# Patient Record
Sex: Female | Born: 1940 | Race: White | Hispanic: No | Smoking: Never smoker
Health system: Southern US, Community
[De-identification: ages and names within clinical notes are randomized; demographics above are authoritative.]

## PROBLEM LIST (undated history)

## (undated) DIAGNOSIS — K5080 Crohn's disease of both small and large intestine without complications: Secondary | ICD-10-CM

## (undated) DIAGNOSIS — I10 Essential (primary) hypertension: Secondary | ICD-10-CM

## (undated) DIAGNOSIS — F329 Major depressive disorder, single episode, unspecified: Secondary | ICD-10-CM

## (undated) DIAGNOSIS — K219 Gastro-esophageal reflux disease without esophagitis: Secondary | ICD-10-CM

## (undated) DIAGNOSIS — F419 Anxiety disorder, unspecified: Secondary | ICD-10-CM

## (undated) DIAGNOSIS — F32A Depression, unspecified: Secondary | ICD-10-CM

## (undated) DIAGNOSIS — K8050 Calculus of bile duct without cholangitis or cholecystitis without obstruction: Secondary | ICD-10-CM

## (undated) DIAGNOSIS — K5010 Crohn's disease of large intestine without complications: Secondary | ICD-10-CM

## (undated) HISTORY — DX: Essential (primary) hypertension: I10

## (undated) HISTORY — DX: Gastro-esophageal reflux disease without esophagitis: K21.9

## (undated) HISTORY — PX: OTHER SURGICAL HISTORY: SHX169

## (undated) HISTORY — PX: BLADDER SUSPENSION: SHX72

## (undated) HISTORY — PX: CHOLECYSTECTOMY: SHX55

## (undated) HISTORY — PX: APPENDECTOMY: SHX54

## (undated) HISTORY — PX: COLONOSCOPY: SHX174

## (undated) HISTORY — DX: Crohn's disease of large intestine without complications: K50.10

## (undated) HISTORY — PX: TOTAL ABDOMINAL HYSTERECTOMY: SHX209

---

## 2013-01-02 ENCOUNTER — Encounter (INDEPENDENT_AMBULATORY_CARE_PROVIDER_SITE_OTHER): Payer: Self-pay | Admitting: *Deleted

## 2013-01-08 ENCOUNTER — Encounter (INDEPENDENT_AMBULATORY_CARE_PROVIDER_SITE_OTHER): Payer: Self-pay | Admitting: *Deleted

## 2013-02-05 ENCOUNTER — Telehealth (INDEPENDENT_AMBULATORY_CARE_PROVIDER_SITE_OTHER): Payer: Self-pay | Admitting: *Deleted

## 2013-02-05 ENCOUNTER — Ambulatory Visit (INDEPENDENT_AMBULATORY_CARE_PROVIDER_SITE_OTHER): Payer: Medicare Other | Admitting: Internal Medicine

## 2013-02-05 ENCOUNTER — Encounter (INDEPENDENT_AMBULATORY_CARE_PROVIDER_SITE_OTHER): Payer: Self-pay | Admitting: Internal Medicine

## 2013-02-05 ENCOUNTER — Other Ambulatory Visit (INDEPENDENT_AMBULATORY_CARE_PROVIDER_SITE_OTHER): Payer: Self-pay | Admitting: *Deleted

## 2013-02-05 VITALS — BP 128/68 | HR 60 | Temp 98.6°F | Ht 62.0 in | Wt 143.8 lb

## 2013-02-05 DIAGNOSIS — Z1211 Encounter for screening for malignant neoplasm of colon: Secondary | ICD-10-CM

## 2013-02-05 DIAGNOSIS — K5010 Crohn's disease of large intestine without complications: Secondary | ICD-10-CM

## 2013-02-05 DIAGNOSIS — K5090 Crohn's disease, unspecified, without complications: Secondary | ICD-10-CM

## 2013-02-05 LAB — CBC WITH DIFFERENTIAL/PLATELET
Basophils Absolute: 0 10*3/uL (ref 0.0–0.1)
Eosinophils Absolute: 0.1 10*3/uL (ref 0.0–0.7)
Eosinophils Relative: 2 % (ref 0–5)
MCH: 29.5 pg (ref 26.0–34.0)
MCV: 84.1 fL (ref 78.0–100.0)
Platelets: 191 10*3/uL (ref 150–400)
RDW: 14 % (ref 11.5–15.5)

## 2013-02-05 MED ORDER — PEG-KCL-NACL-NASULF-NA ASC-C 100 G PO SOLR: 1.0000 | Freq: Once | ORAL | Status: DC

## 2013-02-05 NOTE — Progress Notes (Signed)
Subjective:     Patient ID: Annette Briggs, female   DOB: [birth year], 72 y.o.   MRN: [id number] Patient is allergic to the pneumonia shot.    HPI Referred to our office by Dr. Dwana Melena for Crohn's. Diagnosed with Crohn's disease 8 yrs in [place] by Dr. Royetta Crochet in [place] on Colonoscopy. She tells me she is okay. She tells me sometimes she will have diffuse abdominal pain. She has diarrhea frequently. There is not bright red rectal bleeding. No mucous. She usually has 1-3 stools a day. She usually has diarrhea once a week.  Presently not taking any medication for her Crohn's. She was on Entocort once a day for years she tell me  And then was told by Dr. Royetta Crochet to take the Entocort just when she had a flare. Appetite is good. No unintentional weight loss.  One aunt with hx colon cancer at age 74 or 37.   04/29/2007 Colonoscopy Karolee Stamps MD.([place]) Multiple diverticula were seen in the sigmoid colon and dscending colon. Diverticulosis appeared to be of moderate severity. Grade 1 external hemorrhoids. Ulceration in the terminal ileum. (biopsy). Otherwise normal colonoscopy to cecum and terminal ileum. Biopsy: Active chronic ileitis. Negative for granuloma.  These finding may be compatible with IBD in the appropriate clinical setting.    She is widowed. Her husband has been deceased for 3 yrs from Leukemia.  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  She has 3 children in good health.   Review of Systems Past Medical History  Diagnosis Date  . Crohn's colitis   . GERD (gastroesophageal reflux disease)    No current outpatient prescriptions on file prior to visit.   No current facility-administered medications on file prior to visit.   Past Surgical History  Procedure Laterality Date  . Total abdominal hysterectomy    . Appendectomy    . Cholecystectomy    . Bladder tac      10 yrs ago  . Colonoscopy      2008   Allergies  Allergen Reactions  . Ciprofloxacin   . Wellbutrin  (Bupropion)     Anxiety attack       Objective:   Physical Exam  Filed Vitals:   02/05/13 1013  BP: 128/68  Pulse: 60  Temp: 98.6 F (37 C)  Height: 5\' 2"  (1.575 m)  Weight: 143 lb 12.8 oz (65.227 kg)  Alert and oriented. Skin warm and dry. Oral mucosa is moist.   . Sclera anicteric, conjunctivae is pink. Thyroid not enlarged. No cervical lymphadenopathy. Lungs clear. Heart regular rate and rhythm.  Abdomen is soft. Bowel sounds are positive. No hepatomegaly. No abdominal masses felt. No tenderness.  No edema to lower extremities.        Assessment:   Crohn's disease which seems to be remission. Presently not taking any medications for Crohn's except on a prn basis.    Plan:     CBC and sedrate. Surveillance colonoscopy for Crohn's

## 2013-02-05 NOTE — Patient Instructions (Addendum)
CBC, Sed rate, Surveillance colonoscopy

## 2013-02-05 NOTE — Telephone Encounter (Signed)
Patient needs movi prep 

## 2013-02-07 ENCOUNTER — Encounter (HOSPITAL_COMMUNITY): Payer: Self-pay | Admitting: Pharmacy Technician

## 2013-02-13 ENCOUNTER — Encounter (HOSPITAL_COMMUNITY): Payer: Self-pay | Admitting: *Deleted

## 2013-02-13 ENCOUNTER — Ambulatory Visit (HOSPITAL_COMMUNITY)
Admission: RE | Admit: 2013-02-13 | Discharge: 2013-02-13 | Disposition: A | Discharge status: Home / Self Care | Payer: Medicare Other | Source: Ambulatory Visit | Attending: Internal Medicine | Admitting: Internal Medicine

## 2013-02-13 ENCOUNTER — Encounter (HOSPITAL_COMMUNITY)
Admission: RE | Disposition: A | Discharge status: Home / Self Care | Payer: Self-pay | Source: Ambulatory Visit | Attending: Internal Medicine

## 2013-02-13 DIAGNOSIS — K5289 Other specified noninfective gastroenteritis and colitis: Secondary | ICD-10-CM

## 2013-02-13 DIAGNOSIS — Z800 Family history of malignant neoplasm of digestive organs: Secondary | ICD-10-CM

## 2013-02-13 DIAGNOSIS — K5090 Crohn's disease, unspecified, without complications: Secondary | ICD-10-CM

## 2013-02-13 DIAGNOSIS — K5730 Diverticulosis of large intestine without perforation or abscess without bleeding: Secondary | ICD-10-CM

## 2013-02-13 DIAGNOSIS — Z09 Encounter for follow-up examination after completed treatment for conditions other than malignant neoplasm: Secondary | ICD-10-CM | POA: Insufficient documentation

## 2013-02-13 DIAGNOSIS — I10 Essential (primary) hypertension: Secondary | ICD-10-CM | POA: Insufficient documentation

## 2013-02-13 DIAGNOSIS — D126 Benign neoplasm of colon, unspecified: Secondary | ICD-10-CM | POA: Insufficient documentation

## 2013-02-13 DIAGNOSIS — K633 Ulcer of intestine: Secondary | ICD-10-CM

## 2013-02-13 HISTORY — DX: Depression, unspecified: F32.A

## 2013-02-13 HISTORY — DX: Essential (primary) hypertension: I10

## 2013-02-13 HISTORY — PX: COLONOSCOPY: SHX5424

## 2013-02-13 HISTORY — DX: Major depressive disorder, single episode, unspecified: F32.9

## 2013-02-13 SURGERY — COLONOSCOPY
Anesthesia: Moderate Sedation

## 2013-02-13 MED ORDER — MEPERIDINE HCL 50 MG/ML IJ SOLN
INTRAMUSCULAR | Status: AC
  Filled 2013-02-13: qty 1

## 2013-02-13 MED ORDER — MIDAZOLAM HCL 5 MG/5ML IJ SOLN
INTRAMUSCULAR | Status: DC | PRN
  Administered 2013-02-13 (×3): 2 mg via INTRAVENOUS

## 2013-02-13 MED ORDER — SULFAMETHOXAZOLE-TMP DS 800-160 MG PO TABS: 1.0000 | ORAL_TABLET | Freq: Two times a day (BID) | ORAL | Status: DC

## 2013-02-13 MED ORDER — MIDAZOLAM HCL 5 MG/5ML IJ SOLN
INTRAMUSCULAR | Status: AC
  Filled 2013-02-13: qty 10

## 2013-02-13 MED ORDER — METRONIDAZOLE 500 MG PO TABS: 500.0000 mg | ORAL_TABLET | Freq: Two times a day (BID) | ORAL | Status: DC

## 2013-02-13 MED ORDER — STERILE WATER FOR IRRIGATION IR SOLN
Status: DC | PRN
  Administered 2013-02-13: 07:00:00

## 2013-02-13 MED ORDER — SODIUM CHLORIDE 0.9 % IV SOLN
INTRAVENOUS | Status: DC
  Administered 2013-02-13: 1000 mL via INTRAVENOUS

## 2013-02-13 MED ORDER — MEPERIDINE HCL 50 MG/ML IJ SOLN
INTRAMUSCULAR | Status: DC | PRN
  Administered 2013-02-13 (×2): 25 mg via INTRAVENOUS

## 2013-02-13 NOTE — H&P (Addendum)
Annette Briggs is an 72 y.o. female.   Chief Complaint: Patient is here for colonoscopy. HPI: Patient is a 72 year old Caucasian female who is surveillance colonoscopy. She history of Crohn's disease has been in remission for the past few years and not taking medications. She denies abdominal pain melena or rectal bleeding. Family history is positive for colon carcinoma in paternal was 18 at the time of diagnosis.  Past Medical History  Diagnosis Date  . Crohn's colitis   . GERD (gastroesophageal reflux disease)   . Hypertension   . Depression     Past Surgical History  Procedure Laterality Date  . Total abdominal hysterectomy    . Appendectomy    . Cholecystectomy    . Bladder tac      10 yrs ago  . Colonoscopy      2008    History reviewed. No pertinent family history. Social History:  reports that she has never smoked. She does not have any smokeless tobacco history on file. She reports that she does not drink alcohol or use illicit drugs.  Allergies:  Allergies  Allergen Reactions  . Ciprofloxacin Other (See Comments)    Patient can't remember reaction.  . Pneumovax [Pneumococcal Polysaccharides] Swelling  . Wellbutrin [Bupropion]     Anxiety attack    Medications Prior to Admission  Medication Sig Dispense Refill  . budesonide (ENTOCORT EC) 3 MG 24 hr capsule Take 3 mg by mouth as needed (body aches).       . calcium carbonate (TUMS - DOSED IN MG ELEMENTAL CALCIUM) 500 MG chewable tablet Chew 1 tablet by mouth daily.      . fish oil-omega-3 fatty acids 1000 MG capsule Take 2 g by mouth daily.      Marland Kitchen glucosamine-chondroitin 500-400 MG tablet Take 1 tablet by mouth 3 (three) times daily.      Marland Kitchen loperamide (IMODIUM) 2 MG capsule Take 2 mg by mouth 4 (four) times daily as needed for diarrhea or loose stools.      Marland Kitchen LORazepam (ATIVAN) 0.5 MG tablet Take 0.5 mg by mouth as needed for anxiety.       . metoprolol tartrate (LOPRESSOR) 25 MG tablet Take 25 mg by mouth 2 (two) times  daily.      . Multiple Vitamin (MULTIVITAMIN) tablet Take 1 tablet by mouth daily.      . pantoprazole (PROTONIX) 40 MG tablet Take 40 mg by mouth daily.      . peg 3350 powder (MOVIPREP) 100 G SOLR Take 1 kit (200 g total) by mouth once.  1 kit  0  . pravastatin (PRAVACHOL) 40 MG tablet Take 40 mg by mouth daily.      Marland Kitchen venlafaxine XR (EFFEXOR-XR) 37.5 MG 24 hr capsule Take 37.5 mg by mouth daily.        No results found for this or any previous visit (from the past 48 hour(s)). No results found.  ROS  Blood pressure 126/69, temperature 97.8 F (36.6 C), temperature source Oral, resp. rate 19, height 5' 2"  (1.575 m), weight 143 lb (64.864 kg), SpO2 97.00%. Physical Exam  Constitutional: She appears well-developed and well-nourished.  HENT:  Mouth/Throat: Oropharynx is clear and moist.  Eyes: Conjunctivae are normal.  Neck: No thyromegaly present.  Cardiovascular: Normal rate, regular rhythm and normal heart sounds.   No murmur heard. Respiratory: Effort normal and breath sounds normal.  GI: Soft. She exhibits no distension and no mass. There is no tenderness.  Musculoskeletal: She exhibits no  edema.  Lymphadenopathy:    She has no cervical adenopathy.  Neurological: She is alert.  Skin: Skin is warm and dry.     Assessment/Plan Surveillance colonoscopy. History of small bowel Crohn's disease.  Zachary Lovins U 02/13/2013, 7:38 AM

## 2013-02-13 NOTE — Op Note (Signed)
COLONOSCOPY PROCEDURE REPORT  PATIENT:  Annette Briggs  MR#:  [id number] Birthdate:  05/07/1941, 72 y.o., female Endoscopist:  Dr. [name], MD Referred By:  Dr. [name], MD Procedure Date: 02/13/2013  Procedure:   Colonoscopy  Indications:  Patient is 72 year old Caucasian female with history of Crohn disease is undergoing surveillance colonoscopy. She is on no maintenance therapy and takes the desonide on as-needed basis.  Informed Consent:  The procedure and risks were reviewed with the patient and informed consent was obtained.  Medications:  Demerol 50 mg IV Versed 6 mg IV  Description of procedure:  After a digital rectal exam was performed, that colonoscope was advanced from the anus through the rectum and colon to the area of the cecum, ileocecal valve and appendiceal orifice. The cecum was deeply intubated. These structures were well-seen and photographed for the record. From the level of the cecum and ileocecal valve, the scope was slowly and cautiously withdrawn. The mucosal surfaces were carefully surveyed utilizing scope tip to flexion to facilitate fold flattening as needed. The scope was pulled down into the rectum where a thorough exam including retroflexion was performed. Terminal ileum was also examined.  Findings:  Prep satisfactory. Few scattered small ulcers noted involving terminal ileum. Biopsy taken. Multiple diverticula at sigmoid colon with few at descending colon. Two ulcerated areas the distal sigmoid colon. Biopsy taken from distal of these 2 ulcers. Normal rectal mucosa and anal rectal junction.   Therapeutic/Diagnostic Maneuvers Performed:  See above  Complications:  None  Cecal Withdrawal Time:  15 minutes  Impression:  Few small ulcers involving terminal ileum consistent with active Crohn's disease. Left-sided diverticulosis. Focal ulceration at two areas involving sigmoid colon. Biopsy taken from distal of these ulcers. These changes could  either be secondary to Crohn's colitis or diverticulitis although it would be unusual for her to have diverticulitis involving two diverticula at the same time.  Recommendations:  Standard instructions given. Bactrim DS 1 by mouth twice a day 10 days. Metronidazole 500 mg by mouth twice a day for 10 days. I will contact patient with biopsy results and further recommendations.  Kynadie Yaun U  02/13/2013 8:33 AM  CC: Dr. Delphina Cahill, MD & Dr. Rayne Du ref. provider found

## 2013-02-17 ENCOUNTER — Other Ambulatory Visit (INDEPENDENT_AMBULATORY_CARE_PROVIDER_SITE_OTHER): Payer: Self-pay | Admitting: Internal Medicine

## 2013-02-17 ENCOUNTER — Encounter (HOSPITAL_COMMUNITY): Payer: Self-pay | Admitting: Internal Medicine

## 2013-02-17 MED ORDER — MESALAMINE ER 500 MG PO CPCR: 1000.0000 mg | ORAL_CAPSULE | Freq: Three times a day (TID) | ORAL | Status: DC

## 2013-02-18 ENCOUNTER — Encounter (INDEPENDENT_AMBULATORY_CARE_PROVIDER_SITE_OTHER): Payer: Self-pay | Admitting: *Deleted

## 2013-02-18 NOTE — Progress Notes (Signed)
Apt has been scheduled for a 8 week f/u with Dr. Karilyn Cota on 04/15/13.

## 2013-02-20 ENCOUNTER — Encounter (INDEPENDENT_AMBULATORY_CARE_PROVIDER_SITE_OTHER): Payer: Self-pay

## 2013-04-11 ENCOUNTER — Ambulatory Visit (INDEPENDENT_AMBULATORY_CARE_PROVIDER_SITE_OTHER): Payer: Medicare Other | Admitting: Internal Medicine

## 2013-04-15 ENCOUNTER — Ambulatory Visit (INDEPENDENT_AMBULATORY_CARE_PROVIDER_SITE_OTHER): Payer: Medicare Other | Admitting: Internal Medicine

## 2013-04-21 ENCOUNTER — Ambulatory Visit (INDEPENDENT_AMBULATORY_CARE_PROVIDER_SITE_OTHER): Payer: Medicare Other | Admitting: Internal Medicine

## 2013-04-21 ENCOUNTER — Encounter (INDEPENDENT_AMBULATORY_CARE_PROVIDER_SITE_OTHER): Payer: Self-pay | Admitting: Internal Medicine

## 2013-04-21 VITALS — BP 142/60 | HR 64 | Temp 99.2°F | Ht 62.0 in | Wt 144.8 lb

## 2013-04-21 DIAGNOSIS — F32A Depression, unspecified: Secondary | ICD-10-CM | POA: Insufficient documentation

## 2013-04-21 DIAGNOSIS — I10 Essential (primary) hypertension: Secondary | ICD-10-CM | POA: Insufficient documentation

## 2013-04-21 DIAGNOSIS — F329 Major depressive disorder, single episode, unspecified: Secondary | ICD-10-CM

## 2013-04-21 NOTE — Patient Instructions (Addendum)
OV in 3 months. Continue the Pentasa. 1gm TID.

## 2013-04-21 NOTE — Progress Notes (Signed)
Subjective:     Patient ID: Annette Briggs, female   DOB: Nov 21, 1940, 72 y.o.   MRN: [id number]  HPI Here today for f/u after recent colonoscopy for surveillance for Crohn's. She was diagnosed with Crohn's 8 yrs ago in [place] by Dr. Royetta Crochet on colonoscopy.  She has not been maintained on medications for her Crohn's HPI Referred to our office by Dr. Dwana Melena for Crohn's. Diagnosed with Crohn's disease 8 yrs in [place] by Dr. Royetta Crochet in [place] on Colonoscopy.  On Colonoscopy found to have focal ulceration at two areas involving the sigmoid colon.  Biopsy of ileum showed changes consistent with Crohn's disease. Started on Entocort 9mg , 6mg  3mg  each for 2 weeks then stop. She was also started on Pentasa 1gm TID.  She tells me she is doing pretty good. Some days she is constipated. Today she has had 3 stools today. She says her stools have been much normal now. There has been no diarrhea. She does have some rectal pain but this is not every day. Appetite is good. No weight loss.  Her BMs are brown in color. No melena or bright red rectal bleeding.      02/13/2013 Colonoscopy: Surveillance for Crohn's: Impression:  Few small ulcers involving terminal ileum consistent with active Crohn's disease.  Left-sided diverticulosis.  Focal ulceration at two areas involving sigmoid colon. Biopsy taken from distal of these ulcers. These changes could either be secondary to Crohn's colitis or diverticulitis although it would be unusual for her to have diverticulitis involving two diverticula at the same time.   Biopsy:   Biopsy from ileum and sigmoid colon shows changes consistent with Crohn's disease. 2 small polyps are tubular adenomas.             02/05/2013 Sedrate 12. CBC    Component Value Date/Time   WBC 5.8 02/05/2013 1110   RBC 4.71 02/05/2013 1110   HGB 13.9 02/05/2013 1110   HCT 39.6 02/05/2013 1110   PLT 191 02/05/2013 1110   MCV 84.1 02/05/2013 1110   MCH 29.5 02/05/2013 1110   MCHC 35.1 02/05/2013  1110   RDW 14.0 02/05/2013 1110   LYMPHSABS 1.5 02/05/2013 1110   MONOABS 0.6 02/05/2013 1110   EOSABS 0.1 02/05/2013 1110   BASOSABS 0.0 02/05/2013 1110      Review of Systems     Objective:   Physical Exam  Filed Vitals:   04/21/13 1554  BP: 142/60  Pulse: 64  Temp: 99.2 F (37.3 C)  Height: 5\' 2"  (1.575 m)  Weight: 144 lb 12.8 oz (65.681 kg)   Alert and oriented. Skin warm and dry. Oral mucosa is moist.   . Sclera anicteric, conjunctivae is pink. Thyroid not enlarged. No cervical lymphadenopathy. Lungs clear. Heart regular rate and rhythm.  Abdomen is soft. Bowel sounds are positive. No hepatomegaly. No abdominal masses felt.Slight tenderness left lower quadrant.  No edema to lower extremities.       Assessment:    Crohn's. Active on recent colonoscopy for surveillance. Started on Entocort which she has finished. Pentasa 1gm TID stated. She     Plan:    ov in 3 months. Will get a CRP with visit. Continue the Pentasa

## 2013-07-22 ENCOUNTER — Ambulatory Visit (INDEPENDENT_AMBULATORY_CARE_PROVIDER_SITE_OTHER): Payer: Medicare Other | Admitting: Internal Medicine

## 2013-07-22 ENCOUNTER — Encounter (INDEPENDENT_AMBULATORY_CARE_PROVIDER_SITE_OTHER): Payer: Self-pay | Admitting: Internal Medicine

## 2013-07-22 VITALS — BP 124/76 | HR 74 | Temp 99.8°F | Resp 18 | Ht 62.0 in | Wt 143.1 lb

## 2013-07-22 DIAGNOSIS — K219 Gastro-esophageal reflux disease without esophagitis: Secondary | ICD-10-CM | POA: Insufficient documentation

## 2013-07-22 DIAGNOSIS — K5080 Crohn's disease of both small and large intestine without complications: Secondary | ICD-10-CM

## 2013-07-22 NOTE — Progress Notes (Signed)
Presenting complaint;  Followup for Crohn's disease..  Subjective:  Patient is 73 year old Caucasian female with approximately 7 year history of Crohn's disease who presents for scheduled visit. She was last seen by Ms. Setzer NP October 2014. She had colonoscopy on 02/13/2013 revealing ileal and colonic disease. Colonic disease was in the sigmoid colon and she also had sigmoid and descending colon diverticula. She is on Pentasa. She continues to feel well. She has 2-3 bowel movements per day. Stool consistency base between normal to loose with please half of the stools are loose. She has urgency but has not had any accidents. She denies melena or rectal bleeding. She does not have nocturnal diarrhea has sporadic left-sided abdominal pain relieved with ibuprofen she does not take often to  Current Medications: Current Outpatient Prescriptions  Medication Sig Dispense Refill  . atorvastatin (LIPITOR) 20 MG tablet Take 20 mg by mouth daily.       . calcium carbonate (TUMS - DOSED IN MG ELEMENTAL CALCIUM) 500 MG chewable tablet Chew 1 tablet by mouth daily.      . fish oil-omega-3 fatty acids 1000 MG capsule Take 1 g by mouth.       Marland Kitchen glucosamine-chondroitin 500-400 MG tablet Take 1 tablet by mouth daily.       Marland Kitchen loperamide (IMODIUM) 2 MG capsule Take 2 mg by mouth 4 (four) times daily as needed for diarrhea or loose stools.      Marland Kitchen LORazepam (ATIVAN) 0.5 MG tablet Take 0.5 mg by mouth as needed for anxiety.       . mesalamine (PENTASA) 500 MG CR capsule Take 2 capsules (1,000 mg total) by mouth 3 (three) times daily.  180 capsule  5  . metoprolol tartrate (LOPRESSOR) 25 MG tablet Take 25 mg by mouth 2 (two) times daily.      . Multiple Vitamin (MULTIVITAMIN) tablet Take 1 tablet by mouth daily.      . pantoprazole (PROTONIX) 40 MG tablet Take 40 mg by mouth daily.      Marland Kitchen venlafaxine XR (EFFEXOR-XR) 37.5 MG 24 hr capsule Take 37.5 mg by mouth daily.      Marland Kitchen VITAMIN D, CHOLECALCIFEROL, PO Take 500  Units by mouth daily.       No current facility-administered medications for this visit.     Objective: Blood pressure 124/76, pulse 74, temperature 99.8 F (37.7 C), temperature source Oral, resp. rate 18, height 5' 2"  (1.575 m), weight 143 lb 1.6 oz (64.91 kg). Patient is alert and in no acute distress. Conjunctiva is pink. Sclera is nonicteric Oropharyngeal mucosa is normal. No neck masses or thyromegaly noted. Cardiac exam with regular rhythm normal S1 and S2. No murmur or gallop noted. Lungs are clear to auscultation. Abdomen is symmetrical. It is soft and nontender without organomegaly or masses. No LE edema or clubbing noted.    Assessment:  #1. Ileocolonic Crohn's disease. She has intermittent diarrhea which may indicate mild symptoms but could be unrelated. .   Plan:  Continue Pentasa at 1 g by mouth 3 times a day. Keep record of times Imodium and/or Advil taken. Office visit in 6 months.

## 2013-07-22 NOTE — Patient Instructions (Addendum)
Please keep record of how often you take Imodium and ibuprofen.

## 2013-11-17 ENCOUNTER — Other Ambulatory Visit (INDEPENDENT_AMBULATORY_CARE_PROVIDER_SITE_OTHER): Payer: Self-pay | Admitting: Internal Medicine

## 2013-11-17 NOTE — Telephone Encounter (Signed)
Per Dr.Rehman may fill with 6 refills. 

## 2013-11-18 ENCOUNTER — Telehealth (INDEPENDENT_AMBULATORY_CARE_PROVIDER_SITE_OTHER): Payer: Self-pay | Admitting: *Deleted

## 2013-11-18 NOTE — Telephone Encounter (Signed)
She can draw Pentasa dose to 1 g twice daily; however if symptoms relapse she needs to go back up on the dose and let us know. Can be provided her with some samples. Tammy, please call patient

## 2013-11-18 NOTE — Telephone Encounter (Signed)
Jamielyn would like to know if it would be okay to cut back on the Pentasa to help save the meds and money. She is in a donut hole and it is going to cost her $355 monthly. Her return phone number is [phone].

## 2013-11-18 NOTE — Telephone Encounter (Signed)
Patient is currently taking 2 capsules by mouth three times a day.

## 2013-11-19 NOTE — Telephone Encounter (Signed)
Patient was called and made aware. 

## 2014-01-26 ENCOUNTER — Ambulatory Visit (INDEPENDENT_AMBULATORY_CARE_PROVIDER_SITE_OTHER): Payer: Medicare Other | Admitting: Internal Medicine

## 2014-01-26 ENCOUNTER — Encounter (INDEPENDENT_AMBULATORY_CARE_PROVIDER_SITE_OTHER): Payer: Self-pay | Admitting: Internal Medicine

## 2014-01-26 VITALS — BP 128/72 | HR 74 | Temp 98.5°F | Resp 18 | Ht 62.0 in | Wt 141.4 lb

## 2014-01-26 DIAGNOSIS — K5080 Crohn's disease of both small and large intestine without complications: Secondary | ICD-10-CM

## 2014-01-26 MED ORDER — MESALAMINE ER 500 MG PO CPCR: 1000.0000 mg | ORAL_CAPSULE | Freq: Three times a day (TID) | ORAL | Status: DC

## 2014-01-26 NOTE — Patient Instructions (Signed)
Call if diarrhea or abdominal pain occurs more frequently.

## 2014-01-26 NOTE — Progress Notes (Signed)
Presenting complaint;  Followup for  Crohn's disease.  Subjective:  Patient is a 73 year old Caucasian female with history of ileo-colonic Crohn's disease who presents for scheduled visit. She was last seen 6 months ago. Presently she is doing well. 2 months ago she was in [place] and experience abdominal pain and diarrhea it resolved after 5 days. She did not have fever or rectal bleeding. She has good appetite. She has intermittent nausea with headache and wonders if it is due to Pentasa. She uses Imodium no more than 2-3 times in a month. She has a good appetite and her weight is stable. [place].   Current Medications: Outpatient Encounter Prescriptions as of 01/26/2014  Medication Sig  . atorvastatin (LIPITOR) 20 MG tablet Take 20 mg by mouth daily.   . calcium carbonate (TUMS - DOSED IN MG ELEMENTAL CALCIUM) 500 MG chewable tablet Chew 1 tablet by mouth daily.  . fish oil-omega-3 fatty acids 1000 MG capsule Take 1 g by mouth.   Marland Kitchen glucosamine-chondroitin 500-400 MG tablet Take 1 tablet by mouth daily.   Marland Kitchen loperamide (IMODIUM) 2 MG capsule Take 2 mg by mouth 4 (four) times daily as needed for diarrhea or loose stools.  Marland Kitchen LORazepam (ATIVAN) 0.5 MG tablet Take 0.5 mg by mouth as needed for anxiety.   . metoprolol tartrate (LOPRESSOR) 25 MG tablet Take 25 mg by mouth 2 (two) times daily.  . Multiple Vitamin (MULTIVITAMIN) tablet Take 1 tablet by mouth daily.  Marland Kitchen OVER THE COUNTER MEDICATION Brain Shield with Konotex -Takes 1 by mouth daily.  . pantoprazole (PROTONIX) 40 MG tablet Take 40 mg by mouth daily.  Marland Kitchen PENTASA 500 MG CR capsule TAKE (2) CAPSULES BY MOUTH THREE TIMES DAILY.  Marland Kitchen venlafaxine XR (EFFEXOR-XR) 37.5 MG 24 hr capsule Take 37.5 mg by mouth daily.  Marland Kitchen VITAMIN D, CHOLECALCIFEROL, PO Take 500 Units by mouth daily.  . [DISCONTINUED] PENTASA 500 MG CR capsule TAKE (2) CAPSULES BY MOUTH THREE TIMES  DAILY.     Objective: Blood pressure 128/72, pulse 74, temperature 98.5 F (36.9 C), temperature source Oral, resp. rate 18, height 5' 2"  (1.575 m), weight 141 lb 6.4 oz (64.139 kg). Patient is alert and in no acute distress. Conjunctiva is pink. Sclera is nonicteric Oropharyngeal mucosa is normal. No neck masses or thyromegaly noted. Cardiac exam with regular rhythm normal S1 and S2. No murmur or gallop noted. Lungs are clear to auscultation. Abdomen is symmetrical. Bowel sounds are normal. Abdomen is soft with mild tenderness in RLQ. No organomegaly or masses the  No LE edema or clubbing noted.     Assessment:  #1. Ileo-colonic Crohn's disease. Last colonoscopy was in August 2014. Disease duration about 9 years. She appears to be doing well with oral mesalamine i.e. Pentasa she needs to be at least 3 g per day.    Plan:  Increase Pentasa to 1 g by mouth 3 times a day. Can acquire medication from [place]. She can take first dose after lunch to see if nausea and headache will improve. Office visit in 6 months unless she has problems.

## 2014-05-08 ENCOUNTER — Other Ambulatory Visit (HOSPITAL_COMMUNITY): Payer: Self-pay | Admitting: Internal Medicine

## 2014-05-08 DIAGNOSIS — Z1231 Encounter for screening mammogram for malignant neoplasm of breast: Secondary | ICD-10-CM

## 2014-05-14 ENCOUNTER — Ambulatory Visit (HOSPITAL_COMMUNITY): Payer: Medicare Other

## 2014-06-08 ENCOUNTER — Ambulatory Visit (HOSPITAL_COMMUNITY)
Admission: RE | Admit: 2014-06-08 | Discharge: 2014-06-08 | Disposition: A | Discharge status: Home / Self Care | Payer: Medicare Other | Source: Ambulatory Visit | Attending: Internal Medicine | Admitting: Internal Medicine

## 2014-06-08 DIAGNOSIS — Z1231 Encounter for screening mammogram for malignant neoplasm of breast: Secondary | ICD-10-CM | POA: Diagnosis present

## 2014-07-13 DIAGNOSIS — I10 Essential (primary) hypertension: Secondary | ICD-10-CM | POA: Diagnosis not present

## 2014-07-13 DIAGNOSIS — E785 Hyperlipidemia, unspecified: Secondary | ICD-10-CM | POA: Diagnosis not present

## 2014-07-14 DIAGNOSIS — N390 Urinary tract infection, site not specified: Secondary | ICD-10-CM | POA: Diagnosis not present

## 2014-07-14 DIAGNOSIS — K5090 Crohn's disease, unspecified, without complications: Secondary | ICD-10-CM | POA: Diagnosis not present

## 2014-07-14 DIAGNOSIS — E782 Mixed hyperlipidemia: Secondary | ICD-10-CM | POA: Diagnosis not present

## 2014-07-28 ENCOUNTER — Ambulatory Visit (INDEPENDENT_AMBULATORY_CARE_PROVIDER_SITE_OTHER): Payer: Commercial Managed Care - HMO | Admitting: Internal Medicine

## 2014-07-28 ENCOUNTER — Encounter (INDEPENDENT_AMBULATORY_CARE_PROVIDER_SITE_OTHER): Payer: Self-pay | Admitting: Internal Medicine

## 2014-07-28 VITALS — BP 120/70 | HR 74 | Temp 99.3°F | Resp 18 | Ht 62.0 in | Wt 144.0 lb

## 2014-07-28 DIAGNOSIS — K5080 Crohn's disease of both small and large intestine without complications: Secondary | ICD-10-CM | POA: Diagnosis not present

## 2014-07-28 DIAGNOSIS — K589 Irritable bowel syndrome without diarrhea: Secondary | ICD-10-CM | POA: Diagnosis not present

## 2014-07-28 MED ORDER — DICYCLOMINE HCL 10 MG PO CAPS: 10.0000 mg | ORAL_CAPSULE | Freq: Two times a day (BID) | ORAL | Status: DC

## 2014-07-28 NOTE — Progress Notes (Signed)
Presenting complaint;  Follow-up for ileocolonic Crohn's disease.  Subjective:  Patient is 74 year old Caucasian female who has history of ileocolonic Crohn's disease who now presents for scheduled visit. She was last seen 1 year ago. Last colonoscopy was in August 2014 revealing erosion involving terminal ileum and focal colitis of sigmoid colon. She was treated with 6 weeks of desonide and has been maintained on Pentasa. She says she had blood work by Dr. [name]. Over 3 weeks ago she developed heartburn and regurgitation she did not experience vomiting fever or chills. Since then she has had diarrhea. First stool is usually semi-formed. Subsequent stools may be loose. She is also passing stool every time she voids. She usually has a bowel movement after each meal.  She has noted perianal itching but she denies melena or rectal bleeding. She has noted abdominal cramping over the last 1 month. She denies nocturnal bowel movements per she's not had fever. Her appetite is good. He has not lost any weight since her last visit. She does not take OTC NSAIDs.   Current Medications: Outpatient Encounter Prescriptions as of 07/28/2014  Medication Sig  . atorvastatin (LIPITOR) 20 MG tablet Take 20 mg by mouth daily.   . calcium carbonate (TUMS - DOSED IN MG ELEMENTAL CALCIUM) 500 MG chewable tablet Chew 1 tablet by mouth daily.  . fish oil-omega-3 fatty acids 1000 MG capsule Take 1 g by mouth.   Marland Kitchen glucosamine-chondroitin 500-400 MG tablet Take 1 tablet by mouth daily.   Marland Kitchen loperamide (IMODIUM) 2 MG capsule Take 2 mg by mouth 4 (four) times daily as needed for diarrhea or loose stools.  Marland Kitchen LORazepam (ATIVAN) 0.5 MG tablet Take 0.5 mg by mouth as needed for anxiety.   . mesalamine (PENTASA) 500 MG CR capsule Take 2 capsules (1,000 mg total) by mouth 3 (three) times daily.  . metoprolol tartrate (LOPRESSOR) 25 MG tablet Take 25 mg by mouth 2 (two) times daily.  . Multiple Vitamin (MULTIVITAMIN) tablet  Take 1 tablet by mouth daily.  Marland Kitchen OVER THE COUNTER MEDICATION Brain Shield with Konotex -Takes 1 by mouth daily.  . pantoprazole (PROTONIX) 40 MG tablet Take 40 mg by mouth daily.  Marland Kitchen venlafaxine XR (EFFEXOR-XR) 37.5 MG 24 hr capsule Take 37.5 mg by mouth daily.  Marland Kitchen VITAMIN D, CHOLECALCIFEROL, PO Take 500 Units by mouth daily.     Objective: Blood pressure 120/70, pulse 74, temperature 99.3 F (37.4 C), temperature source Oral, resp. rate 18, height 5' 2"  (1.575 m), weight 144 lb (65.318 kg). Patient is alert and in no acute distress. Conjunctiva is pink. Sclera is nonicteric Oropharyngeal mucosa is normal. No neck masses or thyromegaly noted. Cardiac exam with regular rhythm normal S1 and S2. No murmur or gallop noted. Lungs are clear to auscultation. Abdomen is full. Bowel sounds are normal. On palpation abdomen is soft with mild tenderness at LLQ. No organomegaly or masses. No LE edema or clubbing noted.  Labs/studies Results: Lab data from 07/13/2014 WBC 5.3, H&H 14.6 and 41.9 and platelet count 204K Bilirubin 0.5, AP 82, AST 20, ALT 16, total protein 6.9, albumin 4.1 and calcium 9.4 Electrolytes within normal limits. Glucose 97, BUN 14 and creatinine 0.83.  Assessment:  #1. Ileocolonic Crohn's disease. She has been having abdominal cramping and increase in frequency of bowel movements. Symptoms started about 4 weeks ago when she had a flareup of GERD symptoms. She may have had mild episode of gastroenteritis. Current symptoms are suggestive of irritable bowel syndrome. #2. Chronic  GERD. She had flareup few weeks ago. Pantoprazole seems to be working.   Plan:  Dicyclomine 10 mg by mouth before breakfast and lunch daily. Imodium OTC 2 mg twice a day when necessary. CRP. Office visit in 3 months.

## 2014-07-28 NOTE — Patient Instructions (Signed)
Physician will call with results of blood tests when completed(CRP). Can stop dicyclomine if you experience any side effects and give [name] a call.

## 2014-07-29 LAB — C-REACTIVE PROTEIN

## 2014-08-07 ENCOUNTER — Encounter (INDEPENDENT_AMBULATORY_CARE_PROVIDER_SITE_OTHER): Payer: Self-pay

## 2014-08-26 ENCOUNTER — Telehealth (INDEPENDENT_AMBULATORY_CARE_PROVIDER_SITE_OTHER): Payer: Self-pay | Admitting: *Deleted

## 2014-08-26 NOTE — Telephone Encounter (Signed)
Patient states med you gave her is helping with the diarrhea but still having the pain and she's been taking Ibuprofen for the pain for the last several days

## 2014-10-27 ENCOUNTER — Ambulatory Visit (INDEPENDENT_AMBULATORY_CARE_PROVIDER_SITE_OTHER): Payer: Commercial Managed Care - HMO | Admitting: Internal Medicine

## 2014-10-27 ENCOUNTER — Encounter (INDEPENDENT_AMBULATORY_CARE_PROVIDER_SITE_OTHER): Payer: Self-pay | Admitting: Internal Medicine

## 2014-10-27 VITALS — BP 110/72 | HR 74 | Temp 98.3°F | Resp 18 | Ht 62.0 in | Wt 143.2 lb

## 2014-10-27 DIAGNOSIS — K219 Gastro-esophageal reflux disease without esophagitis: Secondary | ICD-10-CM

## 2014-10-27 DIAGNOSIS — K5080 Crohn's disease of both small and large intestine without complications: Secondary | ICD-10-CM

## 2014-10-27 NOTE — Patient Instructions (Signed)
Bone density to be scheduled in June 2016.

## 2014-10-27 NOTE — Progress Notes (Signed)
Presenting complaint;  Follow-up for Crohn's disease and GERD.  Subjective:  Phase 74 year old Caucasian female who presents for scheduled visit. She was last seen 3 months ago. She states she is feeling very well. Since her last visit she has taken very few doses of times and uses Imodium once or twice a month. She is taking PPI at night because her symptoms mainly occur at night. She is having 1-2 formed stools daily. She may have nonbloody diarrhea 3-4 times in a month. She has not taken dicyclomine recently. She has had one episode of lower abdominal pain which resolved spontaneously. She does not take OTC NSAIDs. She complains of chest and back pain radiating her palms when she walks more than 5-10 minutes. She states cardiac evaluation more than 5 years ago was unremarkable. She states last bone density study was over 5 years ago and was normal.   Current Medications: Outpatient Encounter Prescriptions as of 10/27/2014  Medication Sig  . atorvastatin (LIPITOR) 20 MG tablet Take 20 mg by mouth daily.   . calcium carbonate (TUMS - DOSED IN MG ELEMENTAL CALCIUM) 500 MG chewable tablet Chew 1 tablet by mouth daily.  . fish oil-omega-3 fatty acids 1000 MG capsule Take 1 g by mouth.   Marland Kitchen glucosamine-chondroitin 500-400 MG tablet Take 1 tablet by mouth daily.   Marland Kitchen loperamide (IMODIUM) 2 MG capsule Take 2 mg by mouth 4 (four) times daily as needed for diarrhea or loose stools.  Marland Kitchen LORazepam (ATIVAN) 0.5 MG tablet Take 0.5 mg by mouth as needed for anxiety.   . mesalamine (PENTASA) 500 MG CR capsule Take 2 capsules (1,000 mg total) by mouth 3 (three) times daily.  . metoprolol tartrate (LOPRESSOR) 25 MG tablet Take 25 mg by mouth 2 (two) times daily.  . Multiple Vitamin (MULTIVITAMIN) tablet Take 1 tablet by mouth daily.  Marland Kitchen OVER THE COUNTER MEDICATION Brain Shield with Konotex -Takes 1 by mouth daily.  . pantoprazole (PROTONIX) 40 MG tablet Take 40 mg by mouth daily.  Marland Kitchen venlafaxine XR (EFFEXOR-XR)  37.5 MG 24 hr capsule Take 37.5 mg by mouth daily.  Marland Kitchen VITAMIN D, CHOLECALCIFEROL, PO Take 500 Units by mouth daily.  Marland Kitchen dicyclomine (BENTYL) 10 MG capsule Take 1 capsule (10 mg total) by mouth 2 (two) times daily before a meal. (Patient not taking: Reported on 10/27/2014)     Objective: Blood pressure 110/72, pulse 74, temperature 98.3 F (36.8 C), temperature source Oral, resp. rate 18, height 5' 2"  (1.575 m), weight 143 lb 3.2 oz (64.955 kg). Patient is alert and in no acute distress. Conjunctiva is pink. Sclera is nonicteric Oropharyngeal mucosa is normal. No neck masses or thyromegaly noted. Cardiac exam with regular rhythm normal S1 and S2. No murmur or gallop noted. Lungs are clear to auscultation. Abdomen is symmetrical soft and nontender without organomegaly or masses. No LE edema or clubbing noted.  Labs/studies Results:  CRP was less than 0.5 on 07/28/2014   Assessment:  #1. 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 Her disease was well documented on colonoscopy of August 2014. She is on oral mesalamine and appears to be in remission. She may also have an element of IBS but lately she's not had much in the way of diarrhea and/or urgency rarely uses dicyclomine. She is at risk for osteoporosis. Last study was over 5 years ago. #2. GERD. She is doing well with therapy.   Plan:  Continue Pentasa and 1 g by mouth 3 times a day. Bone density in  June 2016. Office visit in 6 months.

## 2014-11-16 ENCOUNTER — Encounter (INDEPENDENT_AMBULATORY_CARE_PROVIDER_SITE_OTHER): Payer: Self-pay | Admitting: *Deleted

## 2015-01-19 DIAGNOSIS — F419 Anxiety disorder, unspecified: Secondary | ICD-10-CM | POA: Diagnosis not present

## 2015-01-19 DIAGNOSIS — R51 Headache: Secondary | ICD-10-CM | POA: Diagnosis not present

## 2015-01-19 DIAGNOSIS — R079 Chest pain, unspecified: Secondary | ICD-10-CM | POA: Diagnosis not present

## 2015-01-19 DIAGNOSIS — F411 Generalized anxiety disorder: Secondary | ICD-10-CM | POA: Diagnosis not present

## 2015-01-19 DIAGNOSIS — I10 Essential (primary) hypertension: Secondary | ICD-10-CM | POA: Diagnosis not present

## 2015-01-27 ENCOUNTER — Other Ambulatory Visit (INDEPENDENT_AMBULATORY_CARE_PROVIDER_SITE_OTHER): Payer: Self-pay | Admitting: Internal Medicine

## 2015-02-04 ENCOUNTER — Encounter: Payer: Self-pay | Admitting: *Deleted

## 2015-02-05 DIAGNOSIS — I10 Essential (primary) hypertension: Secondary | ICD-10-CM | POA: Diagnosis not present

## 2015-02-05 DIAGNOSIS — E782 Mixed hyperlipidemia: Secondary | ICD-10-CM | POA: Diagnosis not present

## 2015-02-08 DIAGNOSIS — I10 Essential (primary) hypertension: Secondary | ICD-10-CM | POA: Diagnosis not present

## 2015-02-08 DIAGNOSIS — K5090 Crohn's disease, unspecified, without complications: Secondary | ICD-10-CM | POA: Diagnosis not present

## 2015-02-08 DIAGNOSIS — F419 Anxiety disorder, unspecified: Secondary | ICD-10-CM | POA: Diagnosis not present

## 2015-02-08 DIAGNOSIS — E782 Mixed hyperlipidemia: Secondary | ICD-10-CM | POA: Diagnosis not present

## 2015-02-08 DIAGNOSIS — K219 Gastro-esophageal reflux disease without esophagitis: Secondary | ICD-10-CM | POA: Diagnosis not present

## 2015-02-10 ENCOUNTER — Encounter: Payer: Self-pay | Admitting: Cardiology

## 2015-02-10 ENCOUNTER — Encounter: Payer: Self-pay | Admitting: *Deleted

## 2015-02-10 ENCOUNTER — Ambulatory Visit (INDEPENDENT_AMBULATORY_CARE_PROVIDER_SITE_OTHER): Payer: Commercial Managed Care - HMO | Admitting: Cardiology

## 2015-02-10 VITALS — BP 121/79 | HR 72 | Ht 62.0 in | Wt 145.0 lb

## 2015-02-10 DIAGNOSIS — R0789 Other chest pain: Secondary | ICD-10-CM | POA: Diagnosis not present

## 2015-02-10 NOTE — Patient Instructions (Signed)
Your physician recommends that you schedule a follow-up appointment in: [place] DR. [place]  Your physician recommends that you continue on your current medications as directed. Please refer to the Current Medication list given to you today.  WE WILL REQUEST RECORDS FROM DR. Vanetta Shawl.  Thank you for choosing [facility]!!

## 2015-02-10 NOTE — Progress Notes (Signed)
Patient ID: Annette Briggs, female   DOB: [birth year], 74 y.o.   MRN: [id number]     Clinical Summary Ms. Selvy is a 74 y.o.female seen today as a new patient for the following medical problems.  1. Chest pain - notes symptoms with exertion for 5 years. - walking tightness throughout chest and upper back, 8/10. Better with resting. No other associated symptoms. No SOB, no palpitations - denies any DOE - reports previous testing at [place], [place] with heart cath. Reportedly normal.  - no change in frequency, no change in severity since that initial work up.    CAD risk factors: HTN, HL      Past Medical History  Diagnosis Date  . Crohn's colitis   . GERD (gastroesophageal reflux disease)   . Hypertension   . Depression      Allergies  Allergen Reactions  . Ciprofloxacin Other (See Comments)    Patient can't remember reaction.  . Pneumovax [Pneumococcal Polysaccharide Vaccine] Swelling  . Wellbutrin [Bupropion]     Anxiety attack     Current Outpatient Prescriptions  Medication Sig Dispense Refill  . atorvastatin (LIPITOR) 20 MG tablet Take 20 mg by mouth daily.     . calcium carbonate (TUMS - DOSED IN MG ELEMENTAL CALCIUM) 500 MG chewable tablet Chew 1 tablet by mouth daily.    Marland Kitchen dicyclomine (BENTYL) 10 MG capsule TAKE (1) CAPSULE TWICE DAILY BEFORE MEALS. 60 capsule 5  . fish oil-omega-3 fatty acids 1000 MG capsule Take 1 g by mouth.     Marland Kitchen glucosamine-chondroitin 500-400 MG tablet Take 1 tablet by mouth daily.     Marland Kitchen loperamide (IMODIUM) 2 MG capsule Take 2 mg by mouth 4 (four) times daily as needed for diarrhea or loose stools.    Marland Kitchen LORazepam (ATIVAN) 0.5 MG tablet Take 0.5 mg by mouth as needed for anxiety.     . mesalamine (PENTASA) 500 MG CR capsule Take 2 capsules (1,000 mg total) by mouth 3 (three) times daily. 180 capsule 6  . metoprolol tartrate (LOPRESSOR) 25 MG tablet Take 25 mg by mouth 2 (two) times daily.    . Multiple Vitamin (MULTIVITAMIN) tablet Take 1 tablet  by mouth daily.    Marland Kitchen OVER THE COUNTER MEDICATION Brain Shield with Konotex -Takes 1 by mouth daily.    . pantoprazole (PROTONIX) 40 MG tablet Take 40 mg by mouth daily.    Marland Kitchen venlafaxine XR (EFFEXOR-XR) 37.5 MG 24 hr capsule Take 37.5 mg by mouth daily.    Marland Kitchen VITAMIN D, CHOLECALCIFEROL, PO Take 500 Units by mouth daily.     No current facility-administered medications for this visit.     Past Surgical History  Procedure Laterality Date  . Total abdominal hysterectomy    . Appendectomy    . Cholecystectomy    . Bladder tac      10 yrs ago  . Colonoscopy      2008  . Colonoscopy N/A 02/13/2013    Procedure: COLONOSCOPY;  Surgeon: [name], MD;  [facility];  Service: Endoscopy;  Laterality: N/A;  730     Allergies  Allergen Reactions  . Ciprofloxacin Other (See Comments)    Patient can't remember reaction.  . Pneumovax [Pneumococcal Polysaccharide Vaccine] Swelling  . Wellbutrin [Bupropion]     Anxiety attack      Family History  Problem Relation Age of Onset  . Alzheimer's disease Sister      Social History Ms. Tostenson reports that she has never  smoked. She has never used smokeless tobacco. Ms. Boss reports that she does not drink alcohol.   Review of Systems CONSTITUTIONAL: No weight loss, fever, chills, weakness or fatigue.  HEENT: Eyes: No visual loss, blurred vision, double vision or yellow sclerae.No hearing loss, sneezing, congestion, runny nose or sore throat.  SKIN: No rash or itching.  CARDIOVASCULAR: per HPI RESPIRATORY: No shortness of breath, cough or sputum.  GASTROINTESTINAL: No anorexia, nausea, vomiting or diarrhea. No abdominal pain or blood.  GENITOURINARY: No burning on urination, no polyuria NEUROLOGICAL: No headache, dizziness, syncope, paralysis, ataxia, numbness or tingling in the extremities. No change in bowel or bladder control.  MUSCULOSKELETAL: No muscle, back pain, joint pain or stiffness.  LYMPHATICS: No enlarged  nodes. No history of splenectomy.  PSYCHIATRIC: No history of depression or anxiety.  ENDOCRINOLOGIC: No reports of sweating, cold or heat intolerance. No polyuria or polydipsia.  Marland Kitchen   Physical Examination Filed Vitals:   02/10/15 1352  BP: 121/79  Pulse: 72   Filed Vitals:   02/10/15 1352  Height: 5' 2"  (1.575 m)  Weight: 145 lb (65.772 kg)    Gen: resting comfortably, no acute distress HEENT: no scleral icterus, pupils equal round and reactive, no palptable cervical adenopathy,  CV: RRR, no m/r/g, no JVD Resp: Clear to auscultation bilaterally GI: abdomen is soft, non-tender, non-distended, normal bowel sounds, no hepatosplenomegaly MSK: extremities are warm, no edema.  Skin: warm, no rash Neuro:  no focal deficits Psych: appropriate affect   Diagnostic Studies     Assessment and Plan  1. Chest pain - 5 year history of chest pain that is unchanged in character. Negative workup per her report by Dr Adam Phenix at [facility] in [place], [place] including heart cath. Will request results for review, do not see strong indication for repeat testing at this time.  F/u 1 month      Arnoldo Lenis, M.D

## 2015-03-10 ENCOUNTER — Ambulatory Visit (INDEPENDENT_AMBULATORY_CARE_PROVIDER_SITE_OTHER): Payer: Commercial Managed Care - HMO | Admitting: Cardiology

## 2015-03-10 ENCOUNTER — Encounter: Payer: Self-pay | Admitting: Cardiology

## 2015-03-10 VITALS — BP 133/73 | HR 67 | Ht 62.0 in | Wt 146.0 lb

## 2015-03-10 DIAGNOSIS — R0789 Other chest pain: Secondary | ICD-10-CM | POA: Diagnosis not present

## 2015-03-10 NOTE — Progress Notes (Signed)
Patient ID: Annette Briggs, female   DOB: July 16, 1940, 74 y.o.   MRN: [id number]     Clinical Summary Annette Briggs is a 74 y.o.female seen today for follow up of the following medical problems.   1. Chest pain - reports intermittent symptoms with exertion for 5 years. - walking can cause tightness throughout chest and upper back, 8/10. Better with resting. No other associated symptoms. No SOB, no palpitations - denies any DOE - no change in frequency, no change in severity since that initial work up.  - cath 08/2008 [facility]: no obstructive disease - echo 01/2010 [facility]: LVEF 89-38%, mild LV diastolic dysfunction  - no recent symptoms since last visit.    Past Medical History  Diagnosis Date  . Crohn's colitis   . GERD (gastroesophageal reflux disease)   . Hypertension   . Depression      Allergies  Allergen Reactions  . Ciprofloxacin Other (See Comments)    Patient can't remember reaction.  . Pneumovax [Pneumococcal Polysaccharide Vaccine] Swelling  . Wellbutrin [Bupropion]     Anxiety attack     Current Outpatient Prescriptions  Medication Sig Dispense Refill  . Alum Hydroxide-Mag Trisilicate (GAVISCON) 10-17.5 MG CHEW Chew 1 tablet by mouth daily as needed.    Marland Kitchen atorvastatin (LIPITOR) 20 MG tablet Take 20 mg by mouth daily.     Marland Kitchen dicyclomine (BENTYL) 10 MG capsule TAKE (1) CAPSULE TWICE DAILY BEFORE MEALS. 60 capsule 5  . fish oil-omega-3 fatty acids 1000 MG capsule Take 1 g by mouth.     Marland Kitchen glucosamine-chondroitin 500-400 MG tablet Take 1 tablet by mouth daily.     Marland Kitchen loperamide (IMODIUM) 2 MG capsule Take 2 mg by mouth 4 (four) times daily as needed for diarrhea or loose stools.    Marland Kitchen LORazepam (ATIVAN) 0.5 MG tablet Take 0.5 mg by mouth as needed for anxiety.     . mesalamine (PENTASA) 500 MG CR capsule Take 2 capsules (1,000 mg total) by mouth 3 (three) times daily. 180 capsule 6  . metoprolol tartrate (LOPRESSOR) 25 MG tablet Take 25 mg by  mouth 2 (two) times daily.    . Multiple Vitamin (MULTIVITAMIN) tablet Take 1 tablet by mouth daily.    Marland Kitchen OVER THE COUNTER MEDICATION Brain Shield with Konotex -Takes 1 by mouth daily.    . pantoprazole (PROTONIX) 40 MG tablet Take 40 mg by mouth daily.    Marland Kitchen venlafaxine XR (EFFEXOR-XR) 37.5 MG 24 hr capsule Take 37.5 mg by mouth daily.    Marland Kitchen VITAMIN D, CHOLECALCIFEROL, PO Take 500 Units by mouth daily.     No current facility-administered medications for this visit.     Past Surgical History  Procedure Laterality Date  . Total abdominal hysterectomy    . Appendectomy    . Cholecystectomy    . Bladder tac      10 yrs ago  . Colonoscopy      2008  . Colonoscopy N/A 02/13/2013    Procedure: COLONOSCOPY;  Surgeon: [name], MD;  Location[facility];  Service: Endoscopy;  Laterality: N/A;  730     Allergies  Allergen Reactions  . Ciprofloxacin Other (See Comments)    Patient can't remember reaction.  . Pneumovax [Pneumococcal Polysaccharide Vaccine] Swelling  . Wellbutrin [Bupropion]     Anxiety attack      Family History  Problem Relation Age of Onset  . Alzheimer's disease Sister      Social History Ms.  Briggs reports that she has never smoked. She has never used smokeless tobacco. Annette Briggs reports that she does not drink alcohol.   Review of Systems CONSTITUTIONAL: No weight loss, fever, chills, weakness or fatigue.  HEENT: Eyes: No visual loss, blurred vision, double vision or yellow sclerae.No hearing loss, sneezing, congestion, runny nose or sore throat.  SKIN: No rash or itching.  CARDIOVASCULAR: per HPI RESPIRATORY: No shortness of breath, cough or sputum.  GASTROINTESTINAL: No anorexia, nausea, vomiting or diarrhea. No abdominal pain or blood.  GENITOURINARY: No burning on urination, no polyuria NEUROLOGICAL: No headache, dizziness, syncope, paralysis, ataxia, numbness or tingling in the extremities. No change in bowel or bladder control.    MUSCULOSKELETAL: No muscle, back pain, joint pain or stiffness.  LYMPHATICS: No enlarged nodes. No history of splenectomy.  PSYCHIATRIC: No history of depression or anxiety.  ENDOCRINOLOGIC: No reports of sweating, cold or heat intolerance. No polyuria or polydipsia.  Marland Kitchen   Physical Examination Filed Vitals:   03/10/15 1408  BP: 133/73  Pulse: 67   Filed Vitals:   03/10/15 1408  Height: 5' 2"  (1.575 m)  Weight: 146 lb (66.225 kg)    Gen: resting comfortably, no acute distress HEENT: no scleral icterus, pupils equal round and reactive, no palptable cervical adenopathy,  CV: RRR, no m/r/g, no JVD Resp: Clear to auscultation bilaterally GI: abdomen is soft, non-tender, non-distended, normal bowel sounds, no hepatosplenomegaly MSK: extremities are warm, no edema.  Skin: warm, no rash Neuro:  no focal deficits Psych: appropriate affect    Assessment and Plan  1. Chest pain - 5 year history of chest pain that is unchanged in character. Negative workup by Dr Adam Phenix at [facility] in [place], [place] including heart cath a few years ago. No change in symptoms since that time. -  Will continue to follow clinically at this time, no indication for repeat testing.       Arnoldo Lenis, M.D.

## 2015-03-10 NOTE — Patient Instructions (Signed)
Your physician wants you to follow-up in: 1 year with Dr. Branch  You will receive a reminder letter in the mail two months in advance. If you don't receive a letter, please call our office to schedule the follow-up appointment.  Your physician recommends that you continue on your current medications as directed. Please refer to the Current Medication list given to you today.  Thank you for choosing [facility]!!   

## 2015-03-16 ENCOUNTER — Ambulatory Visit: Payer: Self-pay | Admitting: Cardiology

## 2015-04-27 ENCOUNTER — Ambulatory Visit (INDEPENDENT_AMBULATORY_CARE_PROVIDER_SITE_OTHER): Payer: Commercial Managed Care - HMO | Admitting: Internal Medicine

## 2015-05-10 ENCOUNTER — Other Ambulatory Visit (HOSPITAL_COMMUNITY): Payer: Self-pay | Admitting: Internal Medicine

## 2015-05-10 ENCOUNTER — Other Ambulatory Visit (INDEPENDENT_AMBULATORY_CARE_PROVIDER_SITE_OTHER): Payer: Self-pay | Admitting: Internal Medicine

## 2015-05-10 DIAGNOSIS — K5080 Crohn's disease of both small and large intestine without complications: Secondary | ICD-10-CM

## 2015-05-10 DIAGNOSIS — Z1231 Encounter for screening mammogram for malignant neoplasm of breast: Secondary | ICD-10-CM

## 2015-06-14 ENCOUNTER — Ambulatory Visit (HOSPITAL_COMMUNITY)
Admission: RE | Admit: 2015-06-14 | Discharge: 2015-06-14 | Disposition: A | Discharge status: Home / Self Care | Payer: Commercial Managed Care - HMO | Source: Ambulatory Visit | Attending: Internal Medicine | Admitting: Internal Medicine

## 2015-06-14 DIAGNOSIS — Z1231 Encounter for screening mammogram for malignant neoplasm of breast: Secondary | ICD-10-CM | POA: Insufficient documentation

## 2015-06-14 DIAGNOSIS — Z780 Asymptomatic menopausal state: Secondary | ICD-10-CM | POA: Insufficient documentation

## 2015-06-14 DIAGNOSIS — K5080 Crohn's disease of both small and large intestine without complications: Secondary | ICD-10-CM

## 2015-06-14 DIAGNOSIS — M8588 Other specified disorders of bone density and structure, other site: Secondary | ICD-10-CM | POA: Insufficient documentation

## 2015-07-14 DIAGNOSIS — R202 Paresthesia of skin: Secondary | ICD-10-CM | POA: Diagnosis not present

## 2015-07-15 ENCOUNTER — Ambulatory Visit (HOSPITAL_COMMUNITY)
Admission: RE | Admit: 2015-07-15 | Discharge: 2015-07-15 | Disposition: A | Discharge status: Home / Self Care | Payer: Commercial Managed Care - HMO | Source: Ambulatory Visit | Attending: Internal Medicine | Admitting: Internal Medicine

## 2015-07-15 ENCOUNTER — Other Ambulatory Visit (HOSPITAL_COMMUNITY): Payer: Self-pay | Admitting: Internal Medicine

## 2015-07-15 DIAGNOSIS — R29810 Facial weakness: Secondary | ICD-10-CM | POA: Diagnosis not present

## 2015-07-15 LAB — POCT I-STAT CREATININE: CREATININE: 1 mg/dL (ref 0.44–1.00)

## 2015-07-15 IMAGING — CT CT HEAD WO/W CM
3 series · 18 of 30 positions shown, 20 images · IV contrast (Omnipaque 300)
Comparison: None.

CLINICAL DATA: Pain behind LEFT eye.  LEFT facial weakness.

EXAM:
CT HEAD WITHOUT AND WITH CONTRAST
TECHNIQUE: Contiguous axial images were obtained from the base of the skull
through the vertex without and with intravenous contrast
CONTRAST:  75mL OMNIPAQUE IOHEXOL 300 MG/ML  SOLN

[Series 2: head w/o 4.8 h37s · axial · non-contrast · 0.43mm/px · z∈[+129,+262]mm · 8 of 35 slices shown, 10 images]
[im 4/35  brain]
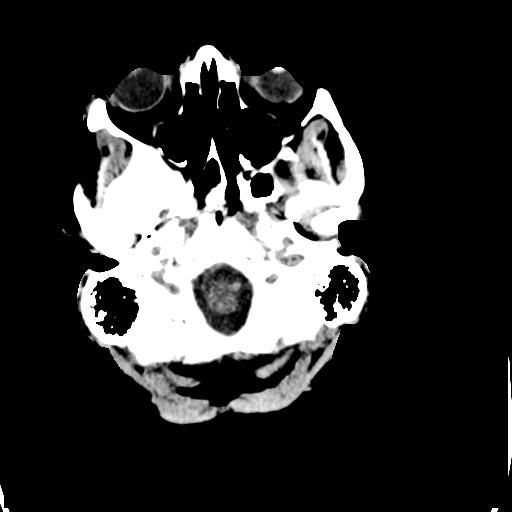
[im 4/35  bone]
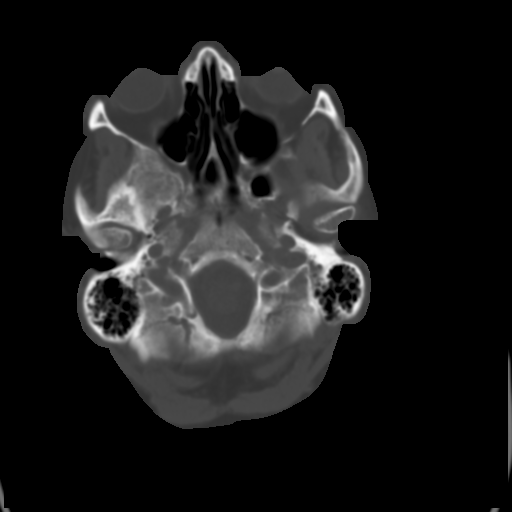
[im 8/35  brain]
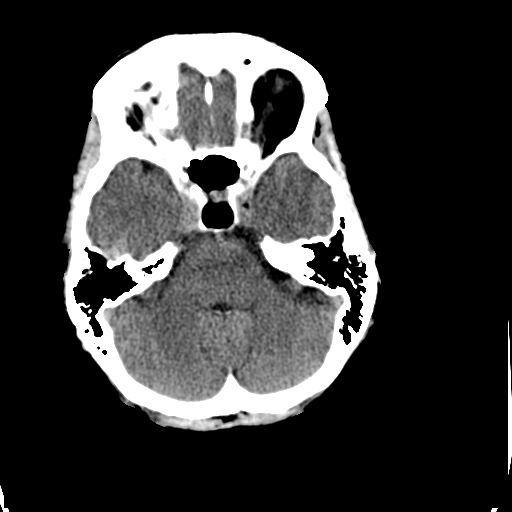
[im 12/35  brain]
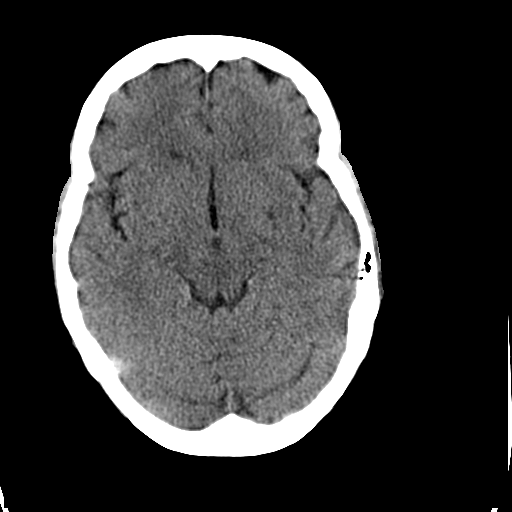
[im 16/35  brain]
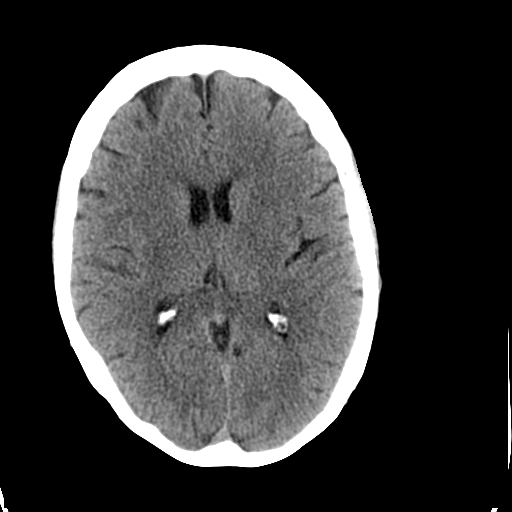
[im 19/35  brain]
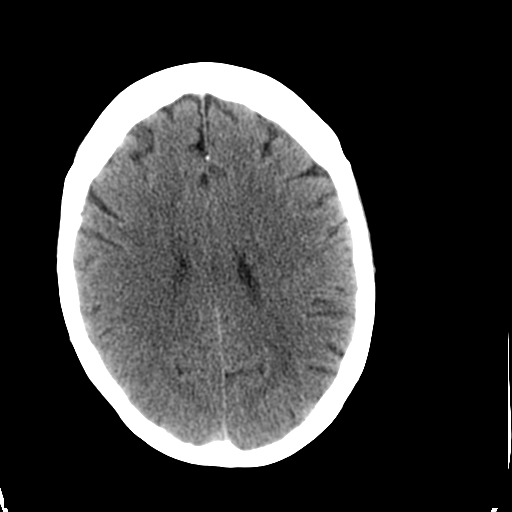
[im 19/35  bone]
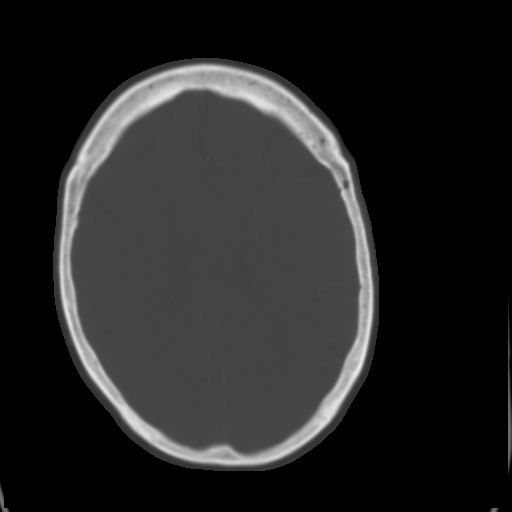
[im 23/35  brain]
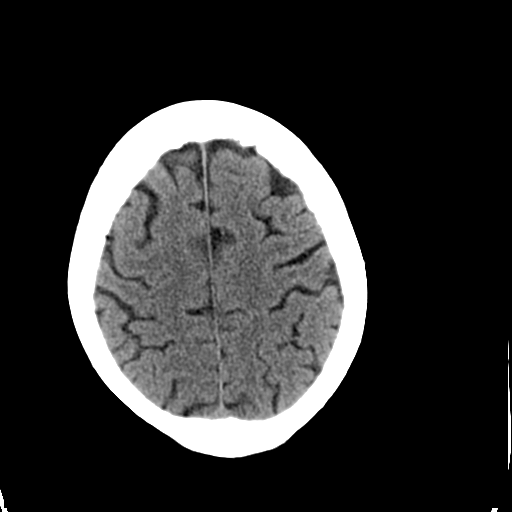
[im 27/35  brain]
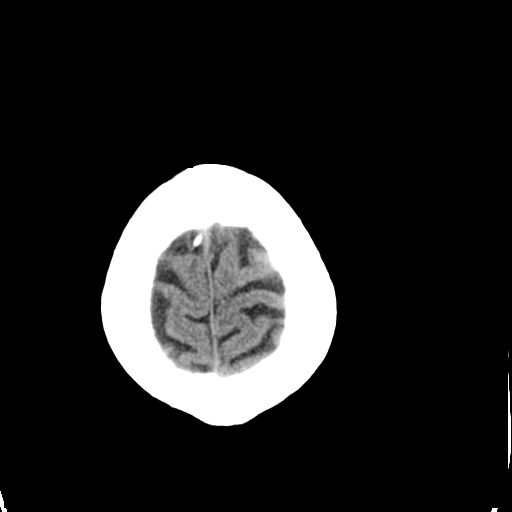
[im 31/35  brain]
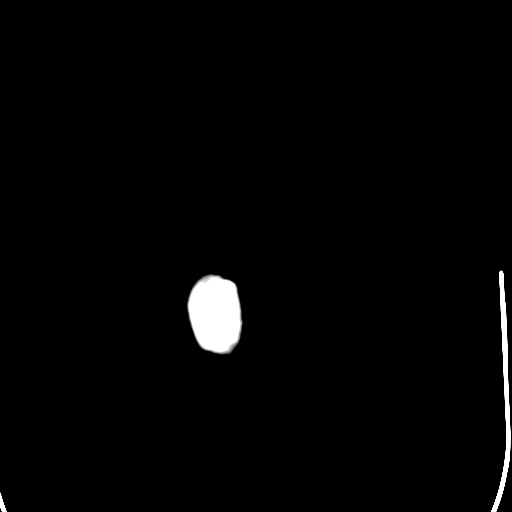

[Series 3: head w/cm 4.8 h40s · axial · 0.43mm/px · z∈[+129,+262]mm · 8 of 35 slices shown]
[im 4/35  brain]
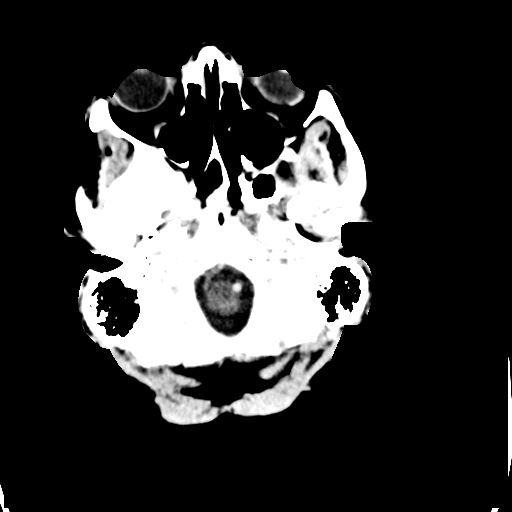
[im 8/35  brain]
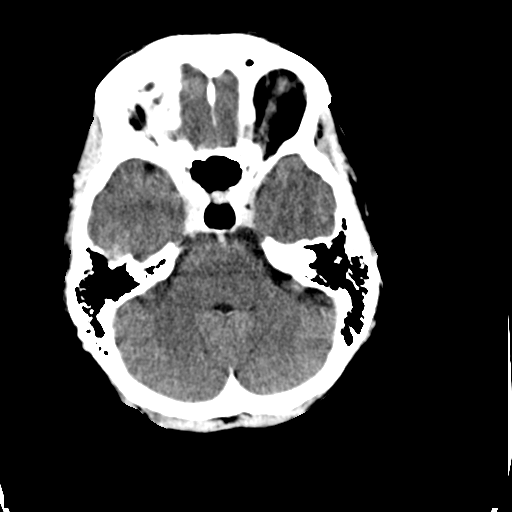
[im 12/35  brain]
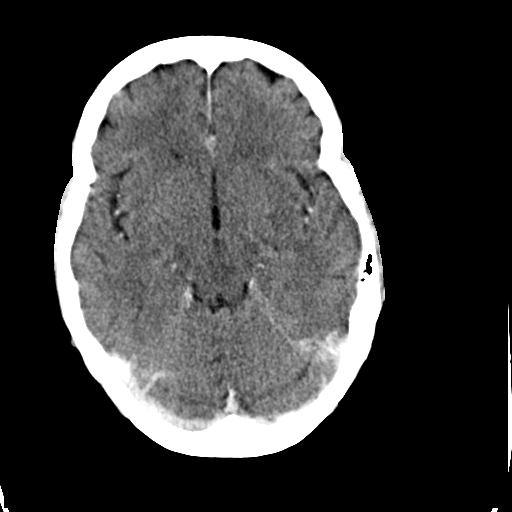
[im 16/35  brain]
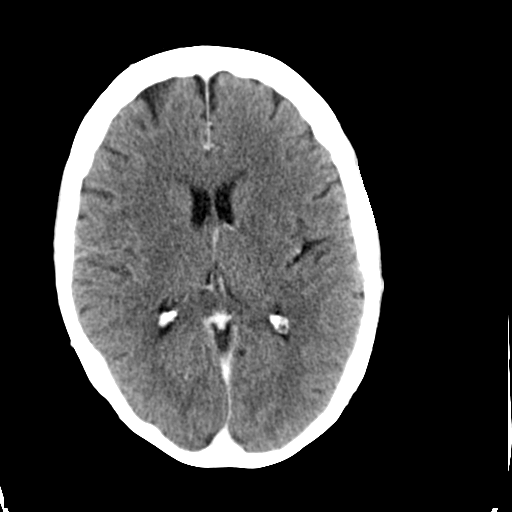
[im 19/35  brain]
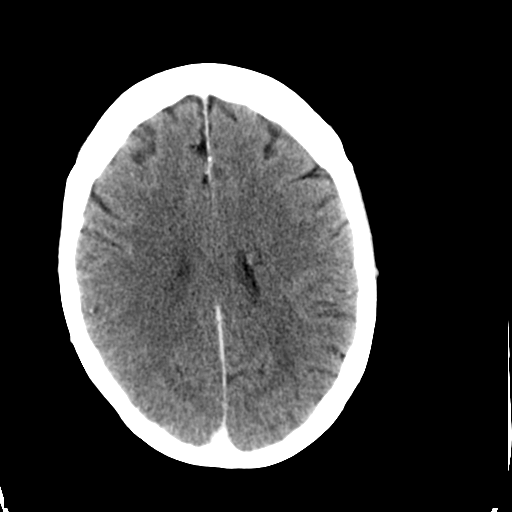
[im 23/35  brain]
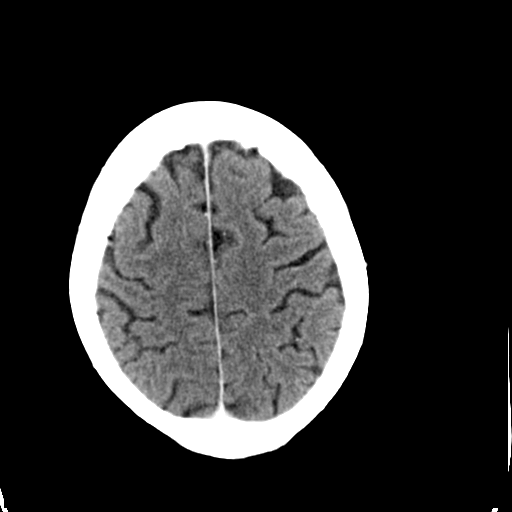
[im 27/35  brain]
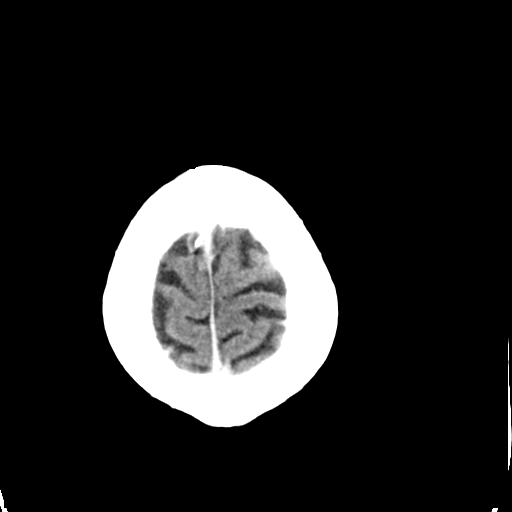
[im 31/35  brain]
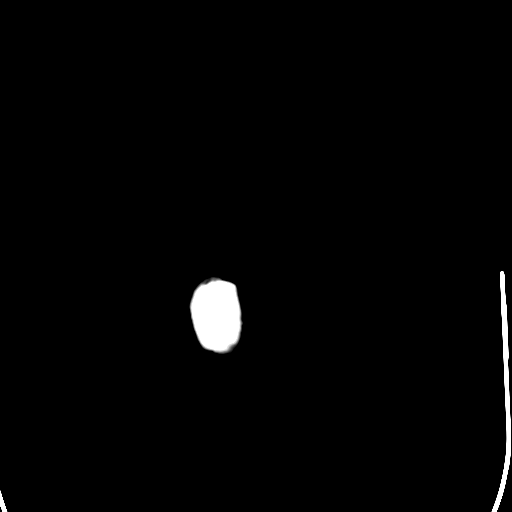

[Series 4: head w/o 4.8 h60s · axial · non-contrast · 0.43mm/px · z∈[+129,+149]mm · 2 of 36 slices shown]
[im 4/36  brain]
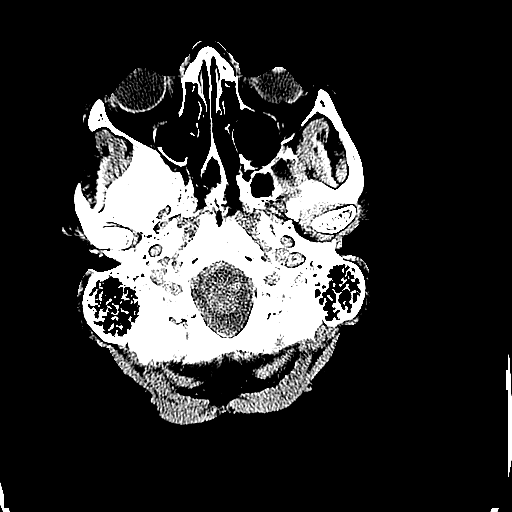
[im 8/36  brain]
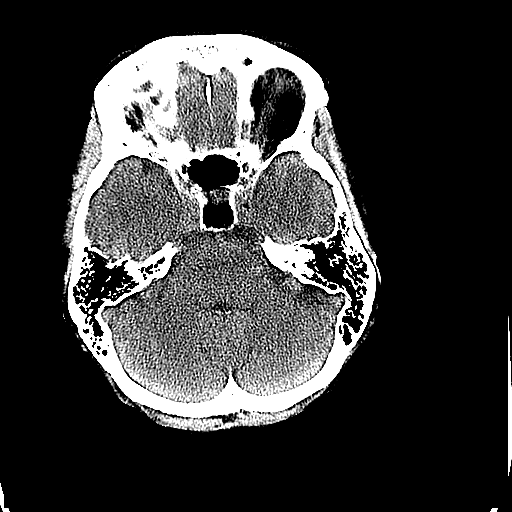

[18 of 30 positions shown; findings below may reference images not displayed]

FINDINGS: No evidence for acute infarction, hemorrhage, mass lesion,
hydrocephalus, or extra-axial fluid. No atrophy or white matter
disease. Intact calvarium. No acute sinus or mastoid disease.

Post infusion, no abnormal enhancement.
IMPRESSION: Negative exam.

## 2015-07-15 MED ORDER — IOHEXOL 300 MG/ML  SOLN
75.0000 mL | Freq: Once | INTRAMUSCULAR | Status: AC | PRN
  Administered 2015-07-15: 75 mL via INTRAVENOUS

## 2015-08-18 DIAGNOSIS — I10 Essential (primary) hypertension: Secondary | ICD-10-CM | POA: Diagnosis not present

## 2015-08-18 DIAGNOSIS — E782 Mixed hyperlipidemia: Secondary | ICD-10-CM | POA: Diagnosis not present

## 2015-08-24 DIAGNOSIS — E782 Mixed hyperlipidemia: Secondary | ICD-10-CM | POA: Diagnosis not present

## 2015-08-24 DIAGNOSIS — I10 Essential (primary) hypertension: Secondary | ICD-10-CM | POA: Diagnosis not present

## 2015-08-24 DIAGNOSIS — K219 Gastro-esophageal reflux disease without esophagitis: Secondary | ICD-10-CM | POA: Diagnosis not present

## 2015-08-24 DIAGNOSIS — F411 Generalized anxiety disorder: Secondary | ICD-10-CM | POA: Diagnosis not present

## 2015-11-15 ENCOUNTER — Ambulatory Visit: Payer: Commercial Managed Care - HMO | Admitting: Cardiology

## 2016-02-09 DIAGNOSIS — E782 Mixed hyperlipidemia: Secondary | ICD-10-CM | POA: Diagnosis not present

## 2016-03-07 DIAGNOSIS — I10 Essential (primary) hypertension: Secondary | ICD-10-CM | POA: Diagnosis not present

## 2016-03-07 DIAGNOSIS — Z0000 Encounter for general adult medical examination without abnormal findings: Secondary | ICD-10-CM | POA: Diagnosis not present

## 2016-03-07 DIAGNOSIS — K580 Irritable bowel syndrome with diarrhea: Secondary | ICD-10-CM | POA: Diagnosis not present

## 2016-03-07 DIAGNOSIS — Z23 Encounter for immunization: Secondary | ICD-10-CM | POA: Diagnosis not present

## 2016-03-07 DIAGNOSIS — K219 Gastro-esophageal reflux disease without esophagitis: Secondary | ICD-10-CM | POA: Diagnosis not present

## 2016-03-07 DIAGNOSIS — E782 Mixed hyperlipidemia: Secondary | ICD-10-CM | POA: Diagnosis not present

## 2016-03-07 DIAGNOSIS — F411 Generalized anxiety disorder: Secondary | ICD-10-CM | POA: Diagnosis not present

## 2016-04-27 DIAGNOSIS — H5211 Myopia, right eye: Secondary | ICD-10-CM | POA: Diagnosis not present

## 2016-04-27 DIAGNOSIS — H2513 Age-related nuclear cataract, bilateral: Secondary | ICD-10-CM | POA: Diagnosis not present

## 2016-04-27 DIAGNOSIS — H5202 Hypermetropia, left eye: Secondary | ICD-10-CM | POA: Diagnosis not present

## 2016-04-27 DIAGNOSIS — H52223 Regular astigmatism, bilateral: Secondary | ICD-10-CM | POA: Diagnosis not present

## 2016-06-09 ENCOUNTER — Telehealth: Payer: Self-pay | Admitting: Cardiology

## 2016-06-09 NOTE — Telephone Encounter (Signed)
Numerous attempts to contact patient with recall letters. Unable to reach by telephone. with no success.   03/10/2015 3:38 PM New [10]     [System] 12/02/2015 11:01 PM Notification Sent [20]   Chanda Busing [[id number]] 03/08/2016 2:38 PM Notification Sent [20]   Lynnda Child Slaughter [[id number]] 06/09/2016 12:53 PM Notification Sent [20]

## 2016-06-19 ENCOUNTER — Telehealth (INDEPENDENT_AMBULATORY_CARE_PROVIDER_SITE_OTHER): Payer: Self-pay | Admitting: Internal Medicine

## 2016-06-19 NOTE — Telephone Encounter (Signed)
Patient called, stated that her hiatal hernia is jumping and she has done everything she knows to do to calm it down.  She would like to know if anyone has any other suggestions.  [phone]

## 2016-06-21 NOTE — Telephone Encounter (Signed)
Patient was called and a voicemail left. Ask that she call our office as we are wanting to check on her.

## 2016-06-28 ENCOUNTER — Telehealth (INDEPENDENT_AMBULATORY_CARE_PROVIDER_SITE_OTHER): Payer: Self-pay | Admitting: *Deleted

## 2016-06-28 NOTE — Telephone Encounter (Signed)
Patient returned phone call to office and she states that she is having reflux, and hiatal hernia problems. She feels this jumping sensation in her chest and says that it messes with her heart beat. Says that it can be all day. She is taking her Pantoprazole at night and is taking a few Gaviscon a few after each meal.  She is having diarrhea/loose stools and she takes imodium and dicyclomine for this. Patient says that when she takes her pills if feel like they are trying to push back up. Dr.Rehman was made aware and he had reviewed the patients record. He feels that she should go to the Ed for evaluation as she has history of cardiac problems. Patient was called and made aware.  Patient states that she does not feel that it is her heart. That a while back she was seen in the Ed in [place], and she was told that it was anxiety. Patient ask if she could make an appointment with Dr.Rehman, feels that it is GI.  Per Dr.Rehman make the patient an appointment to see him for her GI issues.

## 2016-06-29 ENCOUNTER — Encounter: Payer: Self-pay | Admitting: Internal Medicine

## 2016-06-29 ENCOUNTER — Ambulatory Visit (INDEPENDENT_AMBULATORY_CARE_PROVIDER_SITE_OTHER): Payer: Commercial Managed Care - HMO | Admitting: Internal Medicine

## 2016-06-29 VITALS — BP 138/66 | HR 69 | Ht 62.0 in | Wt 139.0 lb

## 2016-06-29 DIAGNOSIS — R002 Palpitations: Secondary | ICD-10-CM

## 2016-06-29 NOTE — Telephone Encounter (Signed)
Patient was given an appointment with Dr. Laural Golden for 07/04/16 at 3:30pm.  I called the patient and spoke to her to give her this information.

## 2016-06-29 NOTE — Patient Instructions (Signed)
Your physician recommends that you schedule a follow-up appointment in: April 2018 Dr Harl Bowie   Your physician recommends that you continue on your current medications as directed. Please refer to the Current Medication list given to you today.    Your physician has recommended that you wear an event monitor. Event monitors are medical devices that record the heart's electrical activity. Doctors most often [name] these monitors to diagnose arrhythmias. Arrhythmias are problems with the speed or rhythm of the heartbeat. The monitor is a small, portable device. You can wear one while you do your normal daily activities. This is usually used to diagnose what is causing palpitations/syncope (passing out).      Thank you for choosing [facility] !

## 2016-06-29 NOTE — Progress Notes (Signed)
Cardiology Office Note   Date:  06/29/2016   ID:  Annette Briggs, DOB 05-29-1941, MRN [id number]  PCP:  [name], MD  Cardiologist:   Harl Bowie   F/U of CP     History of Present Illness: Annette Briggs is a 75 y.o. female with a history of chest pain  Seen by [name] in Sept 2016 Cath in 2010  No obstructive dz  Chest pain stable in 2016  Not felt to be cardiac   Over past week has had jumping in epigastric area  Happenes after eat  Feels jumping  Will effect heart beak No dizziness One time recently seemed more  Made her feel light headed Does  not notice when moving around  Active  NO problems playing pickle ball     No more CP with active   Active  Plays pickle ball        Current Meds  Medication Sig  . Alum Hydroxide-Mag Trisilicate (GAVISCON) 49-20.1 MG CHEW Chew 1 tablet by mouth daily as needed.  Marland Kitchen atorvastatin (LIPITOR) 10 MG tablet Take 10 mg by mouth daily.  Marland Kitchen dicyclomine (BENTYL) 10 MG capsule TAKE (1) CAPSULE TWICE DAILY BEFORE MEALS.  . fish oil-omega-3 fatty acids 1000 MG capsule Take 1 g by mouth.   Marland Kitchen glucosamine-chondroitin 500-400 MG tablet Take 1 tablet by mouth daily.   Marland Kitchen loperamide (IMODIUM) 2 MG capsule Take 2 mg by mouth 4 (four) times daily as needed for diarrhea or loose stools.  Marland Kitchen LORazepam (ATIVAN) 0.5 MG tablet Take 0.5 mg by mouth as needed for anxiety.   . mesalamine (PENTASA) 500 MG CR capsule Take 2 capsules (1,000 mg total) by mouth 3 (three) times daily.  . metoprolol tartrate (LOPRESSOR) 25 MG tablet Take 12.5 mg by mouth at bedtime.  . Multiple Vitamin (MULTIVITAMIN) tablet Take 1 tablet by mouth daily.  Marland Kitchen OVER THE COUNTER MEDICATION Brain Shield with Konotex -Takes 1 by mouth daily.  . pantoprazole (PROTONIX) 40 MG tablet Take 40 mg by mouth daily.  Marland Kitchen venlafaxine XR (EFFEXOR-XR) 37.5 MG 24 hr capsule Take 37.5 mg by mouth daily.  Marland Kitchen VITAMIN D, CHOLECALCIFEROL, PO Take 500 Units by mouth daily.  . [DISCONTINUED] atorvastatin (LIPITOR) 20 MG  tablet Take 20 mg by mouth daily.   . [DISCONTINUED] metoprolol succinate (TOPROL-XL) 25 MG 24 hr tablet Take 1 tablet by mouth daily.     Allergies:   Ciprofloxacin; Pneumovax [pneumococcal polysaccharide vaccine]; and Wellbutrin [bupropion]   Past Medical History:  Diagnosis Date  . Crohn's colitis ([place])   . Depression   . GERD (gastroesophageal reflux disease)   . Hypertension     Past Surgical History:  Procedure Laterality Date  . APPENDECTOMY    . bladder tac     10 yrs ago  . CHOLECYSTECTOMY    . COLONOSCOPY     2008  . COLONOSCOPY N/A 02/13/2013   Procedure: COLONOSCOPY;  Surgeon: [name], MD;  Location: [facility];  Service: Endoscopy;  Laterality: N/A;  730  . TOTAL ABDOMINAL HYSTERECTOMY         Social History:  The patient  reports that she has never smoked. She has never used smokeless tobacco. She reports that she does not drink alcohol or use drugs.   Family History:  The patient's family history includes Alzheimer's disease in her sister.    ROS:  Please see the history of present illness. All other systems are reviewed and  Negative  to the above problem except as noted.    PHYSICAL EXAM: VS:  BP 138/66   Pulse 69   Ht 5' 2"  (1.575 m)   Wt 139 lb (63 kg)   SpO2 98%   BMI 25.42 kg/m   GEN: Well nourished, well developed, in no acute distress  HEENT: normal  Neck: no JVD, carotid bruits, or masses Cardiac: RRR; no murmurs, rubs, or gallops,no edema  Respiratory:  clear to auscultation bilaterally, normal work of breathing GI: soft, nontender, nondistended, + BS  No hepatomegaly  MS: no deformity Moving all extremities   Skin: warm and dry, no rash Neuro:  Strength and sensation are intact Psych: euthymic mood, full affect   EKG:  EKG is ordered today.  SR 69 bpm     Lipid Panel No results found for: CHOL, TRIG, HDL, CHOLHDL, VLDL, LDLCALC, LDLDIRECT    Wt Readings from Last 3 Encounters:  06/29/16 139 lb (63 kg)  03/10/15 146  lb (66.2 kg)  02/10/15 145 lb (65.8 kg)      ASSESSMENT AND PLAN:  1  Chest jumping   Not sure what this represents  Will set up for an event monitor    2  CP  Denies  Active    3  HTN  BP is OK    F?U in April with Dr Harl Bowie   Current medicines are reviewed at length with the patient today.  The patient does not have concerns regarding medicines.  Signed, Dorris Carnes, MD  06/29/2016 1:23 PM    [facility] [place], [place], [place]  [address] Phone: [phone]; Fax: [phone]

## 2016-07-04 ENCOUNTER — Encounter (INDEPENDENT_AMBULATORY_CARE_PROVIDER_SITE_OTHER): Payer: Self-pay | Admitting: Internal Medicine

## 2016-07-04 ENCOUNTER — Ambulatory Visit (INDEPENDENT_AMBULATORY_CARE_PROVIDER_SITE_OTHER): Payer: Commercial Managed Care - HMO | Admitting: Internal Medicine

## 2016-07-04 VITALS — BP 118/68 | HR 74 | Temp 98.0°F | Resp 18 | Ht 62.0 in | Wt 141.0 lb

## 2016-07-04 DIAGNOSIS — K5080 Crohn's disease of both small and large intestine without complications: Secondary | ICD-10-CM

## 2016-07-04 DIAGNOSIS — R002 Palpitations: Secondary | ICD-10-CM | POA: Diagnosis not present

## 2016-07-04 DIAGNOSIS — K219 Gastro-esophageal reflux disease without esophagitis: Secondary | ICD-10-CM | POA: Diagnosis not present

## 2016-07-04 DIAGNOSIS — H25011 Cortical age-related cataract, right eye: Secondary | ICD-10-CM | POA: Diagnosis not present

## 2016-07-04 DIAGNOSIS — H2512 Age-related nuclear cataract, left eye: Secondary | ICD-10-CM | POA: Diagnosis not present

## 2016-07-04 DIAGNOSIS — H25012 Cortical age-related cataract, left eye: Secondary | ICD-10-CM | POA: Diagnosis not present

## 2016-07-04 DIAGNOSIS — H25013 Cortical age-related cataract, bilateral: Secondary | ICD-10-CM | POA: Diagnosis not present

## 2016-07-04 DIAGNOSIS — H2513 Age-related nuclear cataract, bilateral: Secondary | ICD-10-CM | POA: Diagnosis not present

## 2016-07-04 DIAGNOSIS — H2511 Age-related nuclear cataract, right eye: Secondary | ICD-10-CM | POA: Diagnosis not present

## 2016-07-04 MED ORDER — DICYCLOMINE HCL 10 MG PO CAPS: 10.0000 mg | ORAL_CAPSULE | Freq: Two times a day (BID) | ORAL | 5 refills | Status: DC | PRN

## 2016-07-04 NOTE — Patient Instructions (Signed)
Take dicyclomine 10 mg and Imodium OTC 1 mg daily before breakfast. Pantoprazole 40 mg by mouth 30 minutes before evening meal. Skip evening meal once or twice see if this symptom goes away. Please call office with progress report next week.

## 2016-07-04 NOTE — Progress Notes (Signed)
Presenting complaint;  Patient complains of jumping sensation in her chest. History of Crohn's disease.  Database and Subjective:  Patient is 76 year old Caucasian female was history of ileocolonic Crohn's disease and has been doing well on oral mesalamine. She was last seen on 10/07/2014 and was scheduled to return in 6 months. She had to be out of town and appointment was canceled and she did not make another appointment. Patient called last week and requested to be seen. She presents with few week history of abnormal sensation in her retrosternal area which she describes as jumping sensation. This symptom usually occurs after evening meal and may last for 1 hour but it is not a continuous sensation. She does not describe it to be pain pressure or tightness. She states when she has a sensation she has checked her pulse and she feels is not fast. She denies heartburn or regurgitation. She also denies nausea or vomiting. She takes pantoprazole at bedtime and she feels is working. She is having an average of 3 bowel movements every morning. Her stools are loose to semi-formed but never formed. She has not taken Imodium lately. She takes dicyclomine 10-15 doses per months. She has good appetite. She denies weight loss. She says she is very active. She does line dancing twice a week in place pickle ball 3 times a week. She saw Dr. Harrington Challenger last week. EKG was normal and she is scheduled to undergo Holter testing.   Current Medications: Outpatient Encounter Prescriptions as of 07/04/2016  Medication Sig  . Alum Hydroxide-Mag Trisilicate (GAVISCON) 40-08.6 MG CHEW Chew 1 tablet by mouth daily as needed.  Marland Kitchen atorvastatin (LIPITOR) 10 MG tablet Take 10 mg by mouth daily.  Marland Kitchen dicyclomine (BENTYL) 10 MG capsule TAKE (1) CAPSULE TWICE DAILY BEFORE MEALS.  . fish oil-omega-3 fatty acids 1000 MG capsule Take 1 g by mouth.   Marland Kitchen glucosamine-chondroitin 500-400 MG tablet Take 1 tablet by mouth daily.   Marland Kitchen loperamide  (IMODIUM) 2 MG capsule Take 2 mg by mouth 4 (four) times daily as needed for diarrhea or loose stools.  Marland Kitchen LORazepam (ATIVAN) 0.5 MG tablet Take 0.5 mg by mouth as needed for anxiety.   . mesalamine (PENTASA) 500 MG CR capsule Take 2 capsules (1,000 mg total) by mouth 3 (three) times daily.  . metoprolol tartrate (LOPRESSOR) 25 MG tablet Take 12.5 mg by mouth at bedtime.  . Multiple Vitamin (MULTIVITAMIN) tablet Take 1 tablet by mouth daily.  Marland Kitchen OVER THE COUNTER MEDICATION Brain Shield with Konotex -Takes 1 by mouth daily.  . pantoprazole (PROTONIX) 40 MG tablet Take 40 mg by mouth daily.  Marland Kitchen venlafaxine XR (EFFEXOR-XR) 37.5 MG 24 hr capsule Take 37.5 mg by mouth daily.  . vitamin E 400 UNIT capsule Take 400 Units by mouth daily.  . [DISCONTINUED] VITAMIN D, CHOLECALCIFEROL, PO Take 500 Units by mouth daily.   No facility-administered encounter medications on file as of 07/04/2016.      Objective: Blood pressure 118/68, pulse 74, temperature 98 F (36.7 C), temperature source Oral, resp. rate 18, height 5' 2"  (1.575 m), weight 141 lb (64 kg). Patient is alert and in no acute distress. Conjunctiva is pink. Sclera is nonicteric Oropharyngeal mucosa is normal. No neck masses or thyromegaly noted. Cardiac exam with regular rhythm normal S1 and S2. No murmur or gallop noted. Lungs are clear to auscultation. Abdomen is symmetrical soft and nontender without organomegaly or masses.  No LE edema or clubbing noted.    Assessment:  #1.  Chest symptom described as jumping. I have listed palpitation as a visit diagnosis for lack of better terminology. She does not have other associated symptoms to help [name] sort this peculiar symptom. Since it is occurring after meals I suspect she may be having atypical manifestation of GERD. She is also scheduled to undergo Holter testing. If workup is negative and she does not respond to symptomatic therapy will consider upper GI series to make sure her hernia has not  increased in size. #2. Crohn's disease. She is on mesalamine and appears to be doing well. She has mild diarrhea which may be due to Crohn's disease or IBS.   Plan:  Patient advised to skip evening meal once or twice and see if this symptom can be prevented. If this is the case then she should try to eat less food at supper time. Take pantoprazole 30 minutes before evening meal rather than at bedtime. Dicyclomine 10 mg by mouth every morning. Imodium OTC 1 mg by mouth every morning. Patient will call with progress report next week.

## 2016-07-06 DIAGNOSIS — M13151 Monoarthritis, not elsewhere classified, right hip: Secondary | ICD-10-CM | POA: Diagnosis not present

## 2016-07-06 DIAGNOSIS — M25551 Pain in right hip: Secondary | ICD-10-CM | POA: Diagnosis not present

## 2016-07-17 DIAGNOSIS — H25812 Combined forms of age-related cataract, left eye: Secondary | ICD-10-CM | POA: Diagnosis not present

## 2016-07-17 DIAGNOSIS — H25811 Combined forms of age-related cataract, right eye: Secondary | ICD-10-CM | POA: Diagnosis not present

## 2016-07-17 DIAGNOSIS — Z961 Presence of intraocular lens: Secondary | ICD-10-CM | POA: Diagnosis not present

## 2016-07-17 DIAGNOSIS — H2511 Age-related nuclear cataract, right eye: Secondary | ICD-10-CM | POA: Diagnosis not present

## 2016-07-18 DIAGNOSIS — H2512 Age-related nuclear cataract, left eye: Secondary | ICD-10-CM | POA: Diagnosis not present

## 2016-07-31 DIAGNOSIS — H2512 Age-related nuclear cataract, left eye: Secondary | ICD-10-CM | POA: Diagnosis not present

## 2016-08-17 DIAGNOSIS — E782 Mixed hyperlipidemia: Secondary | ICD-10-CM | POA: Diagnosis not present

## 2016-08-22 DIAGNOSIS — B351 Tinea unguium: Secondary | ICD-10-CM | POA: Diagnosis not present

## 2016-08-22 DIAGNOSIS — K219 Gastro-esophageal reflux disease without esophagitis: Secondary | ICD-10-CM | POA: Diagnosis not present

## 2016-08-22 DIAGNOSIS — K580 Irritable bowel syndrome with diarrhea: Secondary | ICD-10-CM | POA: Diagnosis not present

## 2016-08-22 DIAGNOSIS — E782 Mixed hyperlipidemia: Secondary | ICD-10-CM | POA: Diagnosis not present

## 2016-08-22 DIAGNOSIS — I10 Essential (primary) hypertension: Secondary | ICD-10-CM | POA: Diagnosis not present

## 2016-08-22 DIAGNOSIS — F411 Generalized anxiety disorder: Secondary | ICD-10-CM | POA: Diagnosis not present

## 2016-10-05 ENCOUNTER — Encounter (INDEPENDENT_AMBULATORY_CARE_PROVIDER_SITE_OTHER): Payer: Self-pay

## 2016-10-05 ENCOUNTER — Ambulatory Visit (INDEPENDENT_AMBULATORY_CARE_PROVIDER_SITE_OTHER): Payer: Commercial Managed Care - HMO | Admitting: Internal Medicine

## 2016-10-05 ENCOUNTER — Encounter (INDEPENDENT_AMBULATORY_CARE_PROVIDER_SITE_OTHER): Payer: Self-pay | Admitting: Internal Medicine

## 2016-10-05 VITALS — BP 130/64 | HR 60 | Temp 98.3°F | Ht 62.0 in | Wt 141.3 lb

## 2016-10-05 DIAGNOSIS — R0789 Other chest pain: Secondary | ICD-10-CM | POA: Diagnosis not present

## 2016-10-05 NOTE — Progress Notes (Signed)
Subjective:    Patient ID: Annette Briggs, female    DOB: June 11, 1941, 76 y.o.   MRN: [id number]  HPI  Here today for f/u. She was last seen in January by Dr. Laural Golden. Hx of Crohn's disease and maintained on Mesalamine. Saw Dr. Laural Golden in [place] for abnormal sensation in her retrosternal area. She described as as a jumping sensation. Occurred usually after meals and may last for an hour. Was not a continuous sensation. There was no heart bur. No nausea or vomiting. She takes Protonix at bedtime.  Her appetite has remained good. No weight loss. Her EKG in January by Dr. Harrington Challenger was normal.  She was advised by Dr. Laural Golden to take Protonix at evening meal.  Dicyclomine 10mg  am. She tells me she is doing good. No chest pain.  She is taking her Protonix just before her evening meal. No further chest pain since changing the Protonix.  BMs x 1 a day. No melena or BRRB.    Review of Systems Past Medical History:  Diagnosis Date  . Crohn's colitis ([place])   . Depression   . GERD (gastroesophageal reflux disease)   . Hypertension     Past Surgical History:  Procedure Laterality Date  . APPENDECTOMY    . bladder tac     10 yrs ago  . CHOLECYSTECTOMY    . COLONOSCOPY     2008  . COLONOSCOPY N/A 02/13/2013   Procedure: COLONOSCOPY;  Surgeon: [name], MD;  [facility];  Service: Endoscopy;  Laterality: N/A;  730  . TOTAL ABDOMINAL HYSTERECTOMY      Allergies  Allergen Reactions  . Ciprofloxacin Other (See Comments)    Patient can't remember reaction.  . Pneumovax [Pneumococcal Polysaccharide Vaccine] Swelling  . Wellbutrin [Bupropion]     Anxiety attack    Current Outpatient Prescriptions on File Prior to Visit  Medication Sig Dispense Refill  . Alum Hydroxide-Mag Trisilicate (GAVISCON) 34-19.3 MG CHEW Chew 1 tablet by mouth daily as needed.    Marland Kitchen atorvastatin (LIPITOR) 10 MG tablet Take 10 mg by mouth daily.    Marland Kitchen dicyclomine (BENTYL) 10 MG capsule Take 1 capsule (10 mg  total) by mouth 2 (two) times daily as needed for spasms. 60 capsule 5  . fish oil-omega-3 fatty acids 1000 MG capsule Take 1 g by mouth.     Marland Kitchen glucosamine-chondroitin 500-400 MG tablet Take 1 tablet by mouth daily.     Marland Kitchen loperamide (IMODIUM) 2 MG capsule Take 2 mg by mouth 4 (four) times daily as needed for diarrhea or loose stools.    Marland Kitchen LORazepam (ATIVAN) 0.5 MG tablet Take 0.5 mg by mouth as needed for anxiety.     . mesalamine (PENTASA) 500 MG CR capsule Take 2 capsules (1,000 mg total) by mouth 3 (three) times daily. 180 capsule 6  . metoprolol tartrate (LOPRESSOR) 25 MG tablet Take 12.5 mg by mouth at bedtime.    . Multiple Vitamin (MULTIVITAMIN) tablet Take 1 tablet by mouth daily.    Marland Kitchen OVER THE COUNTER MEDICATION Brain Shield with Konotex -Takes 1 by mouth daily.    . pantoprazole (PROTONIX) 40 MG tablet Take 40 mg by mouth daily.    Marland Kitchen venlafaxine XR (EFFEXOR-XR) 37.5 MG 24 hr capsule Take 37.5 mg by mouth daily.    . vitamin E 400 UNIT capsule Take 400 Units by mouth daily.     No current facility-administered medications on file prior to visit.  Objective:   Physical Exam Blood pressure 130/64, pulse 60, temperature 98.3 F (36.8 C), height 5\' 2"  (1.575 m), weight 141 lb 4.8 oz (64.1 kg).         Assessment & Plan:  Chest pain resolved since changing the Protonix around to before evening meal. OV in 1 year.

## 2016-10-05 NOTE — Patient Instructions (Signed)
OV in 1 year.  

## 2016-10-10 ENCOUNTER — Ambulatory Visit (INDEPENDENT_AMBULATORY_CARE_PROVIDER_SITE_OTHER): Payer: Commercial Managed Care - HMO | Admitting: Internal Medicine

## 2017-03-16 DIAGNOSIS — I10 Essential (primary) hypertension: Secondary | ICD-10-CM | POA: Diagnosis not present

## 2017-03-20 DIAGNOSIS — Z23 Encounter for immunization: Secondary | ICD-10-CM | POA: Diagnosis not present

## 2017-03-20 DIAGNOSIS — K219 Gastro-esophageal reflux disease without esophagitis: Secondary | ICD-10-CM | POA: Diagnosis not present

## 2017-03-20 DIAGNOSIS — I10 Essential (primary) hypertension: Secondary | ICD-10-CM | POA: Diagnosis not present

## 2017-03-20 DIAGNOSIS — K580 Irritable bowel syndrome with diarrhea: Secondary | ICD-10-CM | POA: Diagnosis not present

## 2017-03-20 DIAGNOSIS — F411 Generalized anxiety disorder: Secondary | ICD-10-CM | POA: Diagnosis not present

## 2017-03-20 DIAGNOSIS — Z6823 Body mass index (BMI) 23.0-23.9, adult: Secondary | ICD-10-CM | POA: Diagnosis not present

## 2017-03-20 DIAGNOSIS — E782 Mixed hyperlipidemia: Secondary | ICD-10-CM | POA: Diagnosis not present

## 2017-07-23 ENCOUNTER — Telehealth (INDEPENDENT_AMBULATORY_CARE_PROVIDER_SITE_OTHER): Payer: Self-pay | Admitting: *Deleted

## 2017-07-23 NOTE — Telephone Encounter (Signed)
Patient has called [facility] about her Annette Briggs  They said not due to send until February and she will not have enough to last till then.  They said you would need to call for new RX for her to get   [phone]

## 2017-07-24 NOTE — Telephone Encounter (Signed)
I called the drug Company last night . A new prescription was given verbal to the pharmacist. She states that she is going to check with the foundation to see if this medication can be overnighted.  Patient was called this morning and a message left with the above information. We ask that she call our office to update [name] on this.

## 2017-09-06 ENCOUNTER — Encounter (INDEPENDENT_AMBULATORY_CARE_PROVIDER_SITE_OTHER): Payer: Self-pay | Admitting: *Deleted

## 2017-09-06 NOTE — Progress Notes (Signed)
Per Rayne Du they need for her Pharmacy to be called and verify what the cost would be for a 30 day supply of the Pentasa. I called [facility] and they stated that it is not covered by the patient's Insurance , and if she paid out of pocket it would cost her $1200 - $1300 a month.  Rayne Du is requesting a letter with our letter head stating this information.

## 2017-10-03 DIAGNOSIS — I10 Essential (primary) hypertension: Secondary | ICD-10-CM | POA: Diagnosis not present

## 2017-10-03 DIAGNOSIS — Z23 Encounter for immunization: Secondary | ICD-10-CM | POA: Diagnosis not present

## 2017-10-03 DIAGNOSIS — E782 Mixed hyperlipidemia: Secondary | ICD-10-CM | POA: Diagnosis not present

## 2017-10-08 ENCOUNTER — Ambulatory Visit (INDEPENDENT_AMBULATORY_CARE_PROVIDER_SITE_OTHER): Payer: Commercial Managed Care - HMO | Admitting: Internal Medicine

## 2017-10-09 ENCOUNTER — Encounter (INDEPENDENT_AMBULATORY_CARE_PROVIDER_SITE_OTHER): Payer: Self-pay | Admitting: Internal Medicine

## 2017-10-09 ENCOUNTER — Ambulatory Visit (INDEPENDENT_AMBULATORY_CARE_PROVIDER_SITE_OTHER): Payer: Commercial Managed Care - HMO | Admitting: Internal Medicine

## 2017-10-09 VITALS — BP 138/78 | HR 60 | Temp 97.8°F | Ht 61.0 in | Wt 137.1 lb

## 2017-10-09 DIAGNOSIS — K5010 Crohn's disease of large intestine without complications: Secondary | ICD-10-CM

## 2017-10-09 NOTE — Patient Instructions (Signed)
She will continue the Mesalamine. OV in one year.

## 2017-10-09 NOTE — Progress Notes (Signed)
Subjective:    Patient ID: Annette Briggs, female    DOB: Sep 18, 1940, 77 y.o.   MRN: [id number]  HPI Here today for f/u. She was last seen in April of 2018. Hx of Crohn's disease and maintained on Mesalamine. She tells me she is doing well. She staying active. She is playing a type of indoor tennis. Her appetite is good for the most part. She has lost about 4 pounds since her last visit. She has a BM x 1-2 a day. Stools are formed. No melena or BRRB.       Her last colonoscopy was in 2014 by Dr. Laural Golden for surveillance.  Impression:  Few small ulcers involving terminal ileum consistent with active Crohn's disease. Left-sided diverticulosis. Focal ulceration at two areas involving sigmoid colon. Biopsy taken from distal of these ulcers. These changes could either be secondary to Crohn's colitis or diverticulitis although it would be unusual for her to have diverticulitis involving two diverticula at the same time.  Biopsy from ileum and sigmoid colon shows changes consistent with Crohn's disease. 2 small polyps are tubular adenomas.     Review of Systems Past Medical History:  Diagnosis Date  . Crohn's colitis ([place])   . Depression   . GERD (gastroesophageal reflux disease)   . Hypertension     Past Surgical History:  Procedure Laterality Date  . APPENDECTOMY    . bladder tac     10 yrs ago  . CHOLECYSTECTOMY    . COLONOSCOPY     2008  . COLONOSCOPY N/A 02/13/2013   Procedure: COLONOSCOPY;  Surgeon: [name], MD;  [facility];  Service: Endoscopy;  Laterality: N/A;  730  . TOTAL ABDOMINAL HYSTERECTOMY      Allergies  Allergen Reactions  . Ciprofloxacin Other (See Comments)    Patient can't remember reaction.  . Pneumovax [Pneumococcal Polysaccharide Vaccine] Swelling  . Wellbutrin [Bupropion]     Anxiety attack    Current Outpatient Medications on File Prior to Visit  Medication Sig Dispense Refill  . Alum Hydroxide-Mag Trisilicate (GAVISCON)  95-09.3 MG CHEW Chew 1 tablet by mouth daily as needed.    Marland Kitchen atorvastatin (LIPITOR) 10 MG tablet Take 5 mg by mouth daily.     Marland Kitchen dicyclomine (BENTYL) 10 MG capsule Take 1 capsule (10 mg total) by mouth 2 (two) times daily as needed for spasms. 60 capsule 5  . fish oil-omega-3 fatty acids 1000 MG capsule Take 1 g by mouth.     Marland Kitchen glucosamine-chondroitin 500-400 MG tablet Take 1 tablet by mouth daily.     Marland Kitchen loperamide (IMODIUM) 2 MG capsule Take 2 mg by mouth 4 (four) times daily as needed for diarrhea or loose stools.    Marland Kitchen LORazepam (ATIVAN) 0.5 MG tablet Take 0.5 mg by mouth as needed for anxiety.     . mesalamine (PENTASA) 500 MG CR capsule Take 2 capsules (1,000 mg total) by mouth 3 (three) times daily. 180 capsule 6  . metoprolol tartrate (LOPRESSOR) 25 MG tablet Take 12.5 mg by mouth at bedtime.    . Multiple Vitamin (MULTIVITAMIN) tablet Take 1 tablet by mouth daily.    Marland Kitchen OVER THE COUNTER MEDICATION Brain Shield with Konotex -Takes 1 by mouth daily.    . pantoprazole (PROTONIX) 40 MG tablet Take 40 mg by mouth at bedtime.     Marland Kitchen venlafaxine XR (EFFEXOR-XR) 37.5 MG 24 hr capsule Take 37.5 mg by mouth daily.     No current facility-administered  medications on file prior to visit.         Objective:   Physical Exam Blood pressure 138/78, pulse 60, temperature 97.8 F (36.6 C), height 5\' 1"  (1.549 m), weight 137 lb 1.6 oz (62.2 kg).  Alert and oriented. Skin warm and dry. Oral mucosa is moist.   . Sclera anicteric, conjunctivae is pink. Thyroid not enlarged. No cervical lymphadenopathy. Lungs clear. Heart regular rate and rhythm.  Abdomen is soft. Bowel sounds are positive. No hepatomegaly. No abdominal masses felt. No tenderness.  No edema to lower extremities           Assessment & Plan:  Crohn's disease. She is doing well.  She has no GI complaints. Will get blood work from Dr. Nevada Crane. OV in 1 year.

## 2017-10-11 DIAGNOSIS — E782 Mixed hyperlipidemia: Secondary | ICD-10-CM | POA: Diagnosis not present

## 2017-10-11 DIAGNOSIS — K5000 Crohn's disease of small intestine without complications: Secondary | ICD-10-CM | POA: Diagnosis not present

## 2017-10-11 DIAGNOSIS — M25559 Pain in unspecified hip: Secondary | ICD-10-CM | POA: Diagnosis not present

## 2017-10-11 DIAGNOSIS — R202 Paresthesia of skin: Secondary | ICD-10-CM | POA: Diagnosis not present

## 2017-10-11 DIAGNOSIS — I10 Essential (primary) hypertension: Secondary | ICD-10-CM | POA: Diagnosis not present

## 2018-02-27 DIAGNOSIS — Z23 Encounter for immunization: Secondary | ICD-10-CM | POA: Diagnosis not present

## 2018-02-27 DIAGNOSIS — R202 Paresthesia of skin: Secondary | ICD-10-CM | POA: Diagnosis not present

## 2018-02-27 DIAGNOSIS — E782 Mixed hyperlipidemia: Secondary | ICD-10-CM | POA: Diagnosis not present

## 2018-02-27 DIAGNOSIS — K5000 Crohn's disease of small intestine without complications: Secondary | ICD-10-CM | POA: Diagnosis not present

## 2018-02-27 DIAGNOSIS — M25559 Pain in unspecified hip: Secondary | ICD-10-CM | POA: Diagnosis not present

## 2018-02-27 DIAGNOSIS — I10 Essential (primary) hypertension: Secondary | ICD-10-CM | POA: Diagnosis not present

## 2018-04-16 DIAGNOSIS — K5000 Crohn's disease of small intestine without complications: Secondary | ICD-10-CM | POA: Diagnosis not present

## 2018-04-16 DIAGNOSIS — M25559 Pain in unspecified hip: Secondary | ICD-10-CM | POA: Diagnosis not present

## 2018-04-16 DIAGNOSIS — Z6823 Body mass index (BMI) 23.0-23.9, adult: Secondary | ICD-10-CM | POA: Diagnosis not present

## 2018-04-16 DIAGNOSIS — R202 Paresthesia of skin: Secondary | ICD-10-CM | POA: Diagnosis not present

## 2018-04-16 DIAGNOSIS — E782 Mixed hyperlipidemia: Secondary | ICD-10-CM | POA: Diagnosis not present

## 2018-04-16 DIAGNOSIS — I10 Essential (primary) hypertension: Secondary | ICD-10-CM | POA: Diagnosis not present

## 2018-04-16 DIAGNOSIS — Z0000 Encounter for general adult medical examination without abnormal findings: Secondary | ICD-10-CM | POA: Diagnosis not present

## 2018-04-16 DIAGNOSIS — Z23 Encounter for immunization: Secondary | ICD-10-CM | POA: Diagnosis not present

## 2018-04-18 DIAGNOSIS — K5090 Crohn's disease, unspecified, without complications: Secondary | ICD-10-CM | POA: Diagnosis not present

## 2018-04-18 DIAGNOSIS — Z6824 Body mass index (BMI) 24.0-24.9, adult: Secondary | ICD-10-CM | POA: Diagnosis not present

## 2018-04-18 DIAGNOSIS — E782 Mixed hyperlipidemia: Secondary | ICD-10-CM | POA: Diagnosis not present

## 2018-04-18 DIAGNOSIS — I10 Essential (primary) hypertension: Secondary | ICD-10-CM | POA: Diagnosis not present

## 2018-04-18 DIAGNOSIS — R202 Paresthesia of skin: Secondary | ICD-10-CM | POA: Diagnosis not present

## 2018-04-18 DIAGNOSIS — M25559 Pain in unspecified hip: Secondary | ICD-10-CM | POA: Diagnosis not present

## 2018-04-23 ENCOUNTER — Other Ambulatory Visit (HOSPITAL_COMMUNITY): Payer: Self-pay | Admitting: Internal Medicine

## 2018-04-23 DIAGNOSIS — Z1231 Encounter for screening mammogram for malignant neoplasm of breast: Secondary | ICD-10-CM

## 2018-04-23 DIAGNOSIS — Z780 Asymptomatic menopausal state: Secondary | ICD-10-CM

## 2018-05-02 ENCOUNTER — Ambulatory Visit (HOSPITAL_COMMUNITY)
Admission: RE | Admit: 2018-05-02 | Discharge: 2018-05-02 | Disposition: A | Discharge status: Home / Self Care | Payer: Medicare HMO | Source: Ambulatory Visit | Attending: Internal Medicine | Admitting: Internal Medicine

## 2018-05-02 DIAGNOSIS — M8588 Other specified disorders of bone density and structure, other site: Secondary | ICD-10-CM | POA: Diagnosis not present

## 2018-05-02 DIAGNOSIS — Z1231 Encounter for screening mammogram for malignant neoplasm of breast: Secondary | ICD-10-CM | POA: Diagnosis not present

## 2018-05-02 DIAGNOSIS — Z780 Asymptomatic menopausal state: Secondary | ICD-10-CM | POA: Insufficient documentation

## 2018-05-02 DIAGNOSIS — M8589 Other specified disorders of bone density and structure, multiple sites: Secondary | ICD-10-CM | POA: Diagnosis not present

## 2018-05-02 DIAGNOSIS — Z13820 Encounter for screening for osteoporosis: Secondary | ICD-10-CM | POA: Insufficient documentation

## 2018-05-02 IMAGING — MG DIGITAL SCREENING BILATERAL MAMMOGRAM WITH TOMO AND CAD
6 of 10 series · 6 of 30 positions shown · non-contrast
Comparison: Previous exam(s).

CLINICAL DATA: Screening.

EXAM:
DIGITAL SCREENING BILATERAL MAMMOGRAM WITH TOMO AND CAD

[L MLO synth-2D]
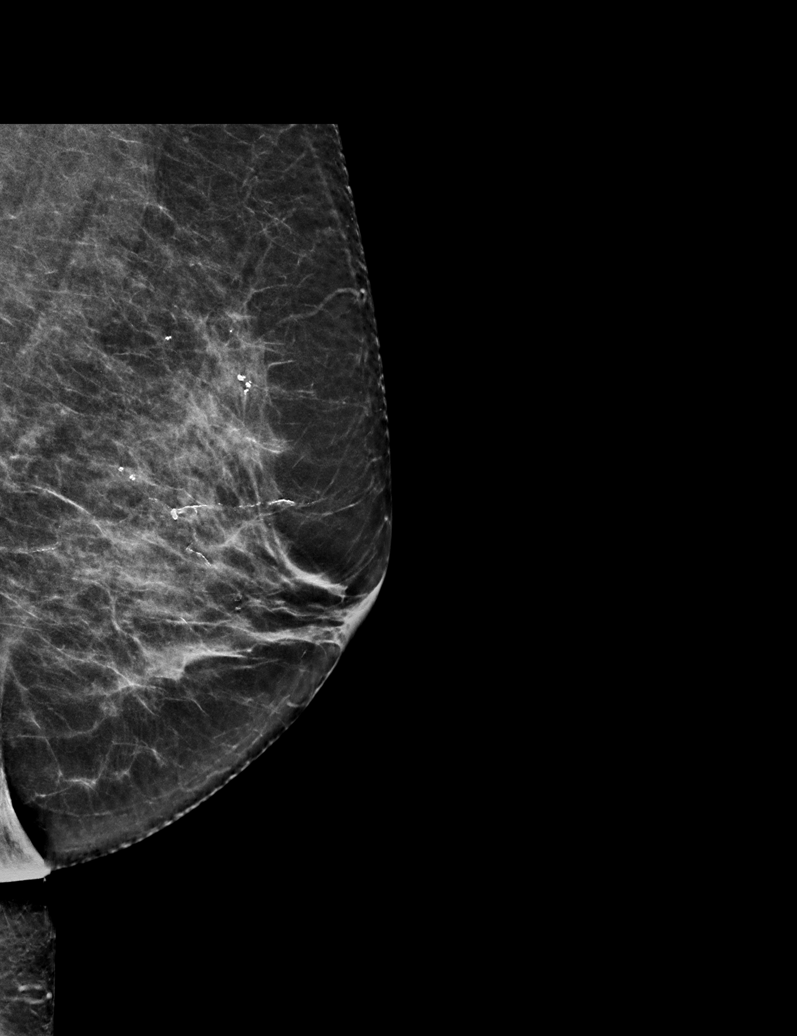

[R MLO synth-2D (1 of 2)]
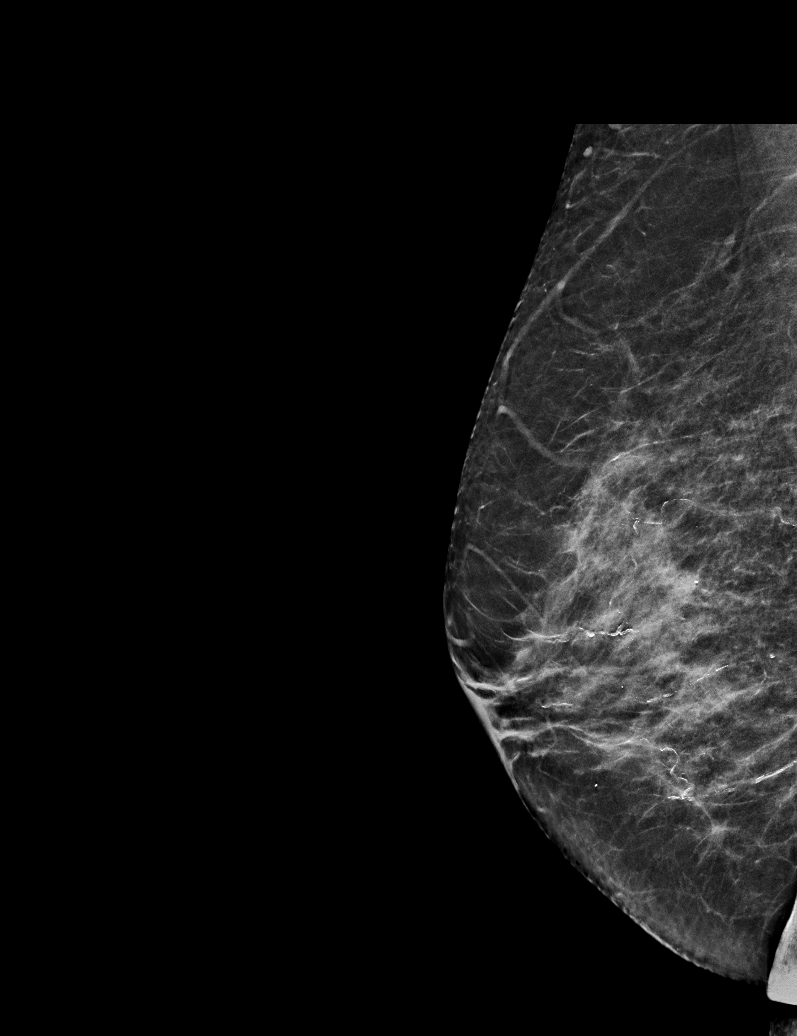

[R CC synth-2D]
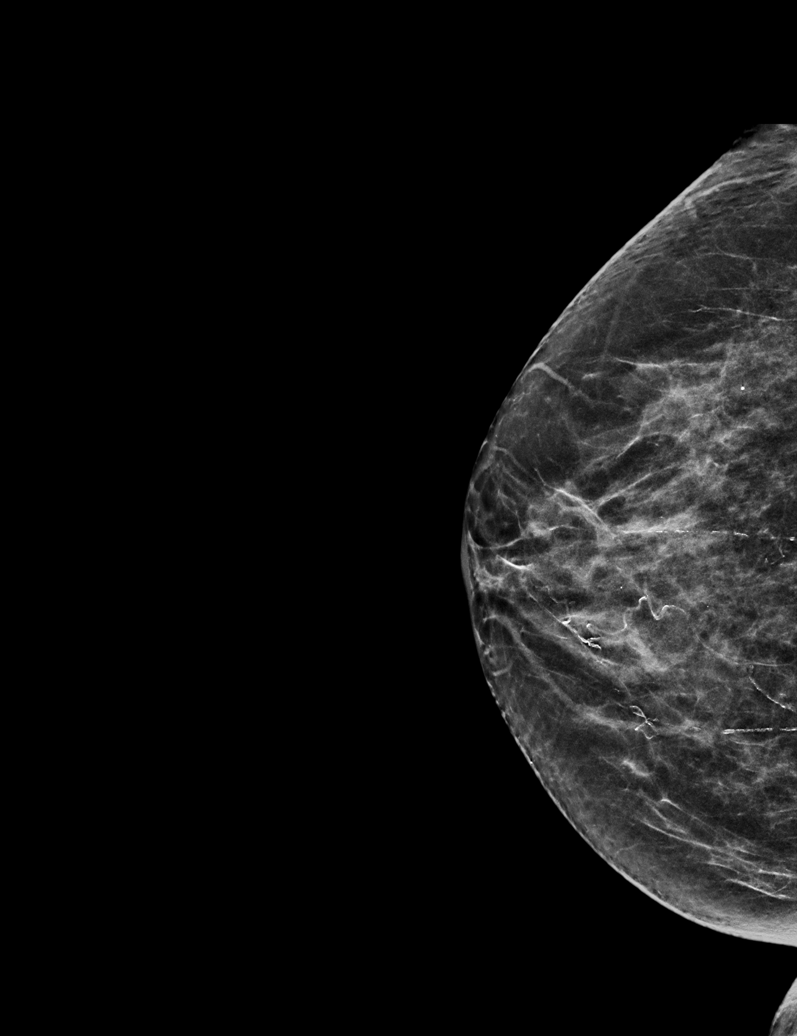

[L CC synth-2D]
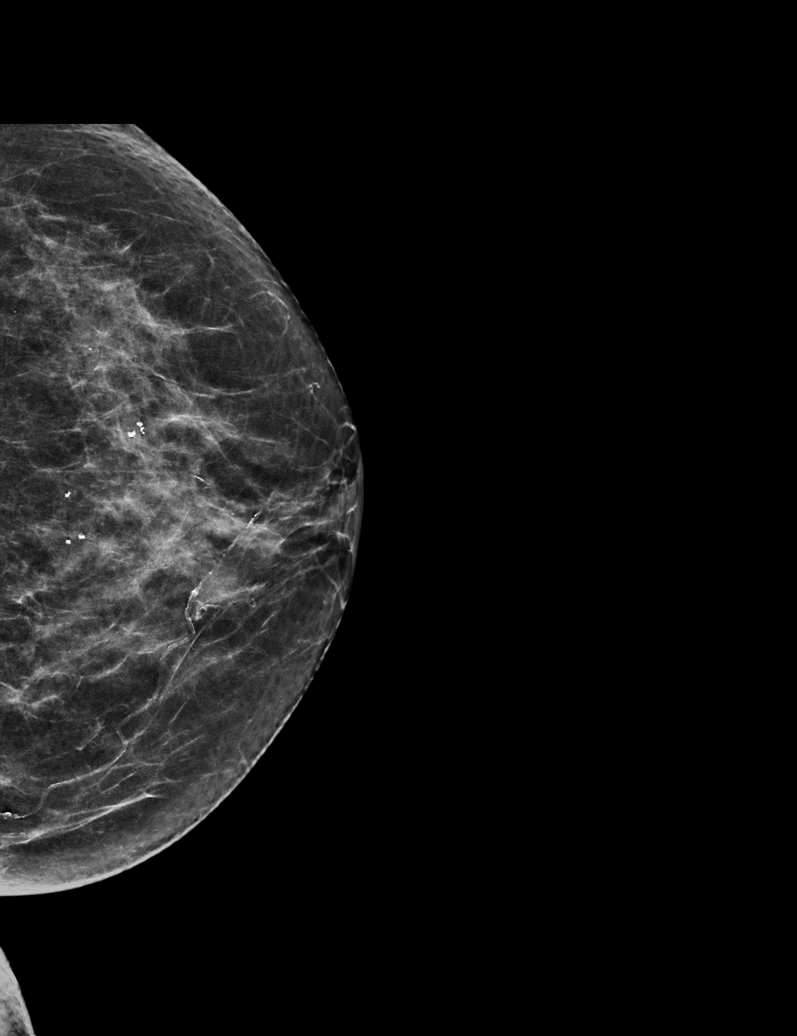

[R MLO synth-2D (2 of 2)]
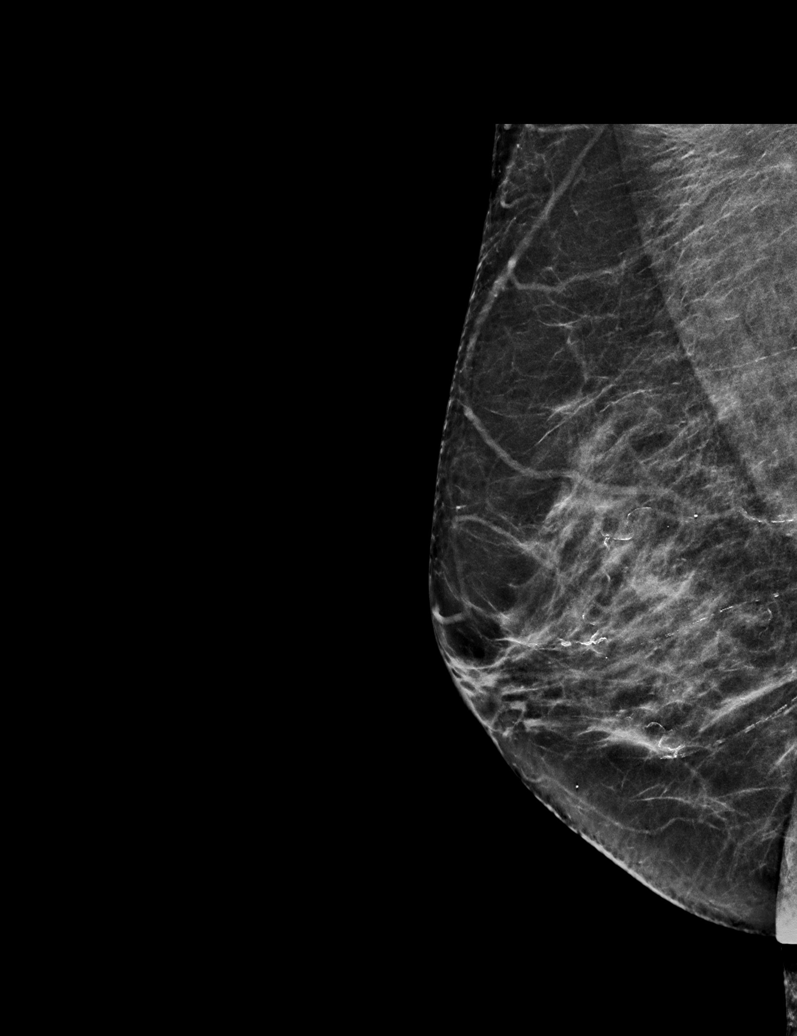

[L CC tomo · tomo slice 29/57.0]
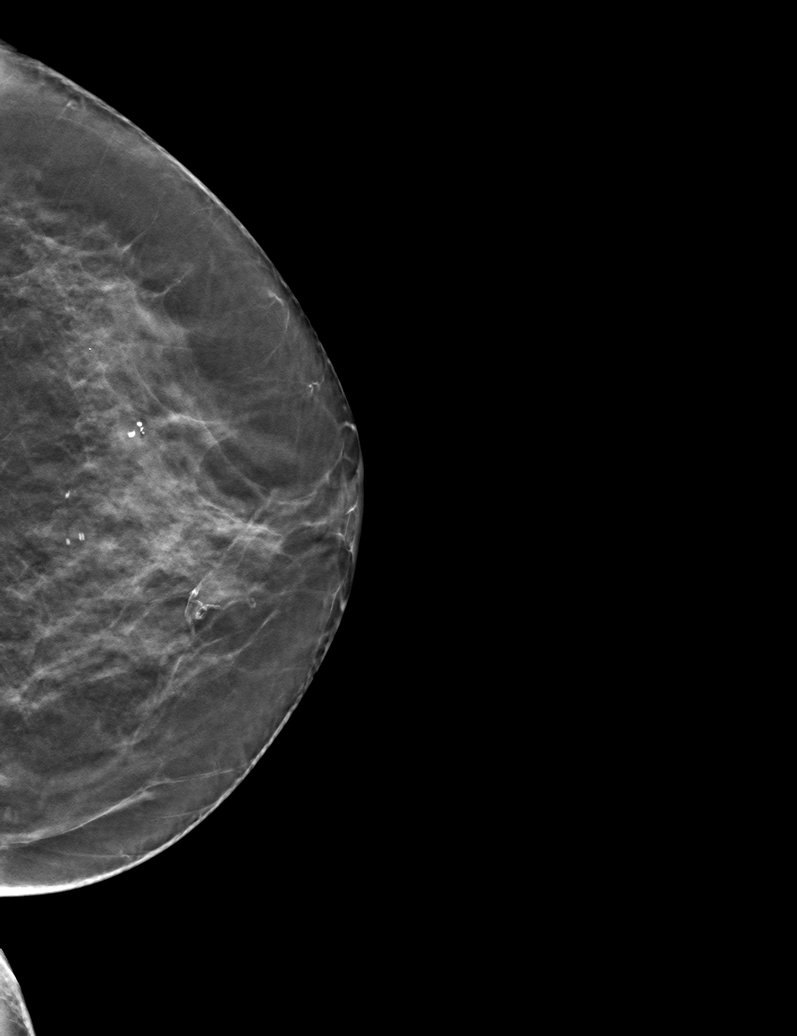

[6 of 30 positions shown; findings below may reference images not displayed]

ACR Breast Density Category c: The breast tissue is heterogeneously
dense, which may obscure small masses.
FINDINGS: There are no findings suspicious for malignancy. Images were
processed with CAD.
IMPRESSION: No mammographic evidence of malignancy. A result letter of this
screening mammogram will be mailed directly to the patient.

RECOMMENDATION:
Screening mammogram in one year. (Code:[5V])

BI-RADS CATEGORY  1: Negative.

## 2018-06-28 DIAGNOSIS — Z23 Encounter for immunization: Secondary | ICD-10-CM | POA: Diagnosis not present

## 2018-07-01 DIAGNOSIS — H02843 Edema of right eye, unspecified eyelid: Secondary | ICD-10-CM | POA: Diagnosis not present

## 2018-07-01 DIAGNOSIS — H00012 Hordeolum externum right lower eyelid: Secondary | ICD-10-CM | POA: Diagnosis not present

## 2018-08-07 DIAGNOSIS — H9209 Otalgia, unspecified ear: Secondary | ICD-10-CM | POA: Diagnosis not present

## 2018-08-07 DIAGNOSIS — R42 Dizziness and giddiness: Secondary | ICD-10-CM | POA: Diagnosis not present

## 2018-08-07 DIAGNOSIS — H814 Vertigo of central origin: Secondary | ICD-10-CM | POA: Diagnosis not present

## 2018-10-14 ENCOUNTER — Ambulatory Visit (INDEPENDENT_AMBULATORY_CARE_PROVIDER_SITE_OTHER): Payer: Medicare HMO | Admitting: Internal Medicine

## 2018-10-14 ENCOUNTER — Other Ambulatory Visit: Payer: Self-pay

## 2018-10-14 ENCOUNTER — Encounter (INDEPENDENT_AMBULATORY_CARE_PROVIDER_SITE_OTHER): Payer: Self-pay | Admitting: Internal Medicine

## 2018-10-14 DIAGNOSIS — K219 Gastro-esophageal reflux disease without esophagitis: Secondary | ICD-10-CM | POA: Diagnosis not present

## 2018-10-14 DIAGNOSIS — K5010 Crohn's disease of large intestine without complications: Secondary | ICD-10-CM | POA: Diagnosis not present

## 2018-10-14 NOTE — Progress Notes (Addendum)
   Subjective:    Patient ID: Annette Briggs, female    DOB: 07-15-1940, 78 y.o.   MRN: [id number] PCP Dr. Celene Squibb.   Start 1:45pm. Ended. 2:05pm.  Total time 15 minutes.  HPI This is a telephone OV due to COVID-19. Unable to due video OV. . She is agreeable to this visit.  She is agreeable to talk with me. She is at home. She was identified by 2 means (name and DOB).  Hx of Crohn's disease. Last seen in April of 2019. Maintained on Mesalamine.  She tells me she is doing good as far as her Crohn's. She has a BM x 2-3 a day.  Has not seen any BRRB or melena.  Her appetite is good. No weight loss. Eats fruits during the day.  Eat 3-4 meals a day.  GERD controlled with Protonix.  Does have a problem if she eats spicy foods. She has no GI complaints at this time.     04/16/2018 H and H 13.9 and 40.0  Her last colonoscopy was in 2014 by Dr. Laural Golden for surveillance.  Impression:  Few small ulcers involving terminal ileum consistent with active Crohn's disease. Left-sided diverticulosis. Focal ulceration at two areas involving sigmoid colon. Biopsy taken from distal of these ulcers. These changes could either be secondary to Crohn's colitis or diverticulitis although it would be unusual for her to have diverticulitis involving two diverticula at the same time.  Biopsy from ileum and sigmoid colon shows changes consistent with Crohn's disease. 2 small polyps are tubular adenomas.  Review of Systems Past Medical History:  Diagnosis Date  . Crohn's colitis ([place])   . Depression   . GERD (gastroesophageal reflux disease)   . Hypertension     Past Surgical History:  Procedure Laterality Date  . APPENDECTOMY    . bladder tac     10 yrs ago  . CHOLECYSTECTOMY    . COLONOSCOPY     2008  . COLONOSCOPY N/A 02/13/2013   Procedure: COLONOSCOPY;  Surgeon: [name], MD;  [facility];  Service: Endoscopy;  Laterality: N/A;  730  . TOTAL ABDOMINAL HYSTERECTOMY       Allergies  Allergen Reactions  . Ciprofloxacin Other (See Comments)    Patient can't remember reaction.  . Pneumovax [Pneumococcal Polysaccharide Vaccine] Swelling  . Wellbutrin [Bupropion]     Anxiety attack     Objective:   Physical Exam deferred      Assessment & Plan:  Crohn's disease. She is doing well. Will get lab work from Dr. [place] Crane. Her last colonoscopy was in 2014. She is due for surveillance. GERD: She will continue the Protonix. OV in 1 year.

## 2018-10-14 NOTE — Patient Instructions (Signed)
[facility] put on a recall for a colonoscopy. She will continue the Protonix. OV in 1 year.

## 2018-11-01 DIAGNOSIS — Z0000 Encounter for general adult medical examination without abnormal findings: Secondary | ICD-10-CM | POA: Diagnosis not present

## 2018-11-22 DIAGNOSIS — H814 Vertigo of central origin: Secondary | ICD-10-CM | POA: Diagnosis not present

## 2018-11-22 DIAGNOSIS — H9209 Otalgia, unspecified ear: Secondary | ICD-10-CM | POA: Diagnosis not present

## 2018-11-22 DIAGNOSIS — E782 Mixed hyperlipidemia: Secondary | ICD-10-CM | POA: Diagnosis not present

## 2018-11-22 DIAGNOSIS — K5000 Crohn's disease of small intestine without complications: Secondary | ICD-10-CM | POA: Diagnosis not present

## 2018-11-22 DIAGNOSIS — Z0000 Encounter for general adult medical examination without abnormal findings: Secondary | ICD-10-CM | POA: Diagnosis not present

## 2018-11-22 DIAGNOSIS — Z6823 Body mass index (BMI) 23.0-23.9, adult: Secondary | ICD-10-CM | POA: Diagnosis not present

## 2018-11-22 DIAGNOSIS — R42 Dizziness and giddiness: Secondary | ICD-10-CM | POA: Diagnosis not present

## 2018-11-22 DIAGNOSIS — Z23 Encounter for immunization: Secondary | ICD-10-CM | POA: Diagnosis not present

## 2018-11-22 DIAGNOSIS — K5090 Crohn's disease, unspecified, without complications: Secondary | ICD-10-CM | POA: Diagnosis not present

## 2018-11-22 DIAGNOSIS — Z6824 Body mass index (BMI) 24.0-24.9, adult: Secondary | ICD-10-CM | POA: Diagnosis not present

## 2018-12-02 DIAGNOSIS — E782 Mixed hyperlipidemia: Secondary | ICD-10-CM | POA: Diagnosis not present

## 2018-12-02 DIAGNOSIS — K5090 Crohn's disease, unspecified, without complications: Secondary | ICD-10-CM | POA: Diagnosis not present

## 2018-12-02 DIAGNOSIS — I10 Essential (primary) hypertension: Secondary | ICD-10-CM | POA: Diagnosis not present

## 2018-12-02 DIAGNOSIS — F331 Major depressive disorder, recurrent, moderate: Secondary | ICD-10-CM | POA: Diagnosis not present

## 2018-12-02 DIAGNOSIS — K219 Gastro-esophageal reflux disease without esophagitis: Secondary | ICD-10-CM | POA: Diagnosis not present

## 2018-12-10 ENCOUNTER — Encounter (INDEPENDENT_AMBULATORY_CARE_PROVIDER_SITE_OTHER): Payer: Self-pay | Admitting: *Deleted

## 2018-12-10 ENCOUNTER — Other Ambulatory Visit (INDEPENDENT_AMBULATORY_CARE_PROVIDER_SITE_OTHER): Payer: Self-pay | Admitting: Internal Medicine

## 2018-12-10 ENCOUNTER — Telehealth (INDEPENDENT_AMBULATORY_CARE_PROVIDER_SITE_OTHER): Payer: Self-pay | Admitting: *Deleted

## 2018-12-10 DIAGNOSIS — K5010 Crohn's disease of large intestine without complications: Secondary | ICD-10-CM

## 2018-12-10 MED ORDER — PEG 3350-KCL-NA BICARB-NACL 420 G PO SOLR: 4000.0000 mL | Freq: Once | ORAL | 0 refills | Status: AC

## 2018-12-10 NOTE — Telephone Encounter (Signed)
Patient needs trilyte 

## 2018-12-30 DIAGNOSIS — H1030 Unspecified acute conjunctivitis, unspecified eye: Secondary | ICD-10-CM | POA: Diagnosis not present

## 2018-12-30 DIAGNOSIS — H02843 Edema of right eye, unspecified eyelid: Secondary | ICD-10-CM | POA: Diagnosis not present

## 2018-12-30 DIAGNOSIS — H00012 Hordeolum externum right lower eyelid: Secondary | ICD-10-CM | POA: Diagnosis not present

## 2019-01-10 ENCOUNTER — Other Ambulatory Visit (HOSPITAL_COMMUNITY)
Admission: RE | Admit: 2019-01-10 | Discharge: 2019-01-10 | Disposition: A | Discharge status: Home / Self Care | Payer: Medicare HMO | Source: Ambulatory Visit | Attending: Internal Medicine | Admitting: Internal Medicine

## 2019-01-10 ENCOUNTER — Other Ambulatory Visit: Payer: Self-pay

## 2019-01-10 DIAGNOSIS — Z1159 Encounter for screening for other viral diseases: Secondary | ICD-10-CM | POA: Diagnosis not present

## 2019-01-10 DIAGNOSIS — Z01812 Encounter for preprocedural laboratory examination: Secondary | ICD-10-CM | POA: Diagnosis not present

## 2019-01-10 LAB — SARS CORONAVIRUS 2 (TAT 6-24 HRS): SARS Coronavirus 2: NEGATIVE

## 2019-01-15 ENCOUNTER — Other Ambulatory Visit: Payer: Self-pay

## 2019-01-15 ENCOUNTER — Encounter (HOSPITAL_COMMUNITY): Payer: Self-pay

## 2019-01-15 ENCOUNTER — Ambulatory Visit (HOSPITAL_COMMUNITY)
Admission: RE | Admit: 2019-01-15 | Discharge: 2019-01-15 | Disposition: A | Discharge status: Home / Self Care | Payer: Medicare HMO | Attending: Internal Medicine | Admitting: Internal Medicine

## 2019-01-15 ENCOUNTER — Encounter (HOSPITAL_COMMUNITY): Payer: Self-pay | Admitting: *Deleted

## 2019-01-15 ENCOUNTER — Encounter (HOSPITAL_COMMUNITY)
Admission: RE | Disposition: A | Discharge status: Home / Self Care | Payer: Self-pay | Source: Home / Self Care | Attending: Internal Medicine

## 2019-01-15 DIAGNOSIS — Z791 Long term (current) use of non-steroidal anti-inflammatories (NSAID): Secondary | ICD-10-CM | POA: Diagnosis not present

## 2019-01-15 DIAGNOSIS — K219 Gastro-esophageal reflux disease without esophagitis: Secondary | ICD-10-CM | POA: Insufficient documentation

## 2019-01-15 DIAGNOSIS — F419 Anxiety disorder, unspecified: Secondary | ICD-10-CM | POA: Insufficient documentation

## 2019-01-15 DIAGNOSIS — K5010 Crohn's disease of large intestine without complications: Secondary | ICD-10-CM | POA: Diagnosis not present

## 2019-01-15 DIAGNOSIS — Z79899 Other long term (current) drug therapy: Secondary | ICD-10-CM | POA: Insufficient documentation

## 2019-01-15 DIAGNOSIS — Z800 Family history of malignant neoplasm of digestive organs: Secondary | ICD-10-CM | POA: Insufficient documentation

## 2019-01-15 DIAGNOSIS — F329 Major depressive disorder, single episode, unspecified: Secondary | ICD-10-CM | POA: Insufficient documentation

## 2019-01-15 DIAGNOSIS — Z888 Allergy status to other drugs, medicaments and biological substances status: Secondary | ICD-10-CM | POA: Diagnosis not present

## 2019-01-15 DIAGNOSIS — K5732 Diverticulitis of large intestine without perforation or abscess without bleeding: Secondary | ICD-10-CM | POA: Insufficient documentation

## 2019-01-15 DIAGNOSIS — Z9049 Acquired absence of other specified parts of digestive tract: Secondary | ICD-10-CM | POA: Insufficient documentation

## 2019-01-15 DIAGNOSIS — Z90710 Acquired absence of both cervix and uterus: Secondary | ICD-10-CM | POA: Insufficient documentation

## 2019-01-15 DIAGNOSIS — I10 Essential (primary) hypertension: Secondary | ICD-10-CM | POA: Insufficient documentation

## 2019-01-15 DIAGNOSIS — Z881 Allergy status to other antibiotic agents status: Secondary | ICD-10-CM | POA: Insufficient documentation

## 2019-01-15 DIAGNOSIS — K5730 Diverticulosis of large intestine without perforation or abscess without bleeding: Secondary | ICD-10-CM | POA: Diagnosis not present

## 2019-01-15 DIAGNOSIS — Z887 Allergy status to serum and vaccine status: Secondary | ICD-10-CM | POA: Diagnosis not present

## 2019-01-15 DIAGNOSIS — Z09 Encounter for follow-up examination after completed treatment for conditions other than malignant neoplasm: Secondary | ICD-10-CM | POA: Diagnosis not present

## 2019-01-15 DIAGNOSIS — Z820 Family history of epilepsy and other diseases of the nervous system: Secondary | ICD-10-CM | POA: Insufficient documentation

## 2019-01-15 HISTORY — DX: Anxiety disorder, unspecified: F41.9

## 2019-01-15 HISTORY — PX: COLONOSCOPY: SHX5424

## 2019-01-15 SURGERY — COLONOSCOPY
Anesthesia: Moderate Sedation

## 2019-01-15 MED ORDER — SODIUM CHLORIDE 0.9 % IV SOLN
INTRAVENOUS | Status: DC
  Administered 2019-01-15: 1000 mL via INTRAVENOUS

## 2019-01-15 MED ORDER — MIDAZOLAM HCL 5 MG/5ML IJ SOLN
INTRAMUSCULAR | Status: AC
  Filled 2019-01-15: qty 10

## 2019-01-15 MED ORDER — SULFAMETHOXAZOLE-TRIMETHOPRIM 800-160 MG PO TABS: 1.0000 | ORAL_TABLET | Freq: Two times a day (BID) | ORAL | 0 refills | Status: DC

## 2019-01-15 MED ORDER — STERILE WATER FOR IRRIGATION IR SOLN
Status: DC | PRN
  Administered 2019-01-15: 2.5 mL

## 2019-01-15 MED ORDER — METRONIDAZOLE 500 MG PO TABS: 500.0000 mg | ORAL_TABLET | Freq: Two times a day (BID) | ORAL | 0 refills | Status: DC

## 2019-01-15 MED ORDER — MIDAZOLAM HCL 5 MG/5ML IJ SOLN
INTRAMUSCULAR | Status: DC | PRN
  Administered 2019-01-15 (×2): 1 mg via INTRAVENOUS
  Administered 2019-01-15: 2 mg via INTRAVENOUS

## 2019-01-15 MED ORDER — MEPERIDINE HCL 50 MG/ML IJ SOLN
INTRAMUSCULAR | Status: AC
  Filled 2019-01-15: qty 1

## 2019-01-15 MED ORDER — MEPERIDINE HCL 50 MG/ML IJ SOLN
INTRAMUSCULAR | Status: DC | PRN
  Administered 2019-01-15 (×2): 20 mg via INTRAVENOUS
  Administered 2019-01-15: 10 mg via INTRAVENOUS

## 2019-01-15 NOTE — Op Note (Signed)
[facility] Patient Name: Annette Briggs Procedure Date: 01/15/2019 8:56 AM MRN: [id number] Date of Birth: 1941/04/21 Attending MD: Hildred Laser , MD CSN: [id number] Age: 78 Admit Type: Outpatient Procedure:                Colonoscopy Indications:              High risk colon cancer surveillance: Crohn's                            colitis of 8 (or more) years duration with                            one-third (or more) of the colon involved Providers:                Hildred Laser, MD, Otis Peak B. Sharon Seller, RN, Raphael Gibney Tech., Technician, Aram Candela Referring MD:             Delphina Cahill, MD Medicines:                Meperidine 50 mg IV, Midazolam 4 mg IV Complications:            No immediate complications. Estimated Blood Loss:     Estimated blood loss: none. Procedure:                Pre-Anesthesia Assessment:                           - Prior to the procedure, a History and Physical                            was performed, and patient medications and                            allergies were reviewed. The patient's tolerance of                            previous anesthesia was also reviewed. The risks                            and benefits of the procedure and the sedation                            options and risks were discussed with the patient.                            All questions were answered, and informed consent                            was obtained. Prior Anticoagulants: The patient has                            taken no previous anticoagulant or antiplatelet  agents except for aspirin. ASA Grade Assessment: II                            - A patient with mild systemic disease. After                            reviewing the risks and benefits, the patient was                            deemed in satisfactory condition to undergo the                            procedure.                           After  obtaining informed consent, the colonoscope                            was passed under direct vision. Throughout the                            procedure, the patient's blood pressure, pulse, and                            oxygen saturations were monitored continuously. The                            [id number]-[id number] ([id number]) scope was introduced through                            the anus and advanced to the the cecum, identified                            by appendiceal orifice and ileocecal valve. The                            colonoscopy was technically difficult and complex                            due to restricted mobility of the colon. The                            patient tolerated the procedure well. The quality                            of the bowel preparation was excellent. The                            ileocecal valve, appendiceal orifice, and rectum                            were photographed. Scope In: 9:30:08 AM Scope Out: 10:03:44 AM Scope Withdrawal Time: 0 hours 12 minutes 7 seconds  Total Procedure Duration: 0 hours 33 minutes  36 seconds  Findings:      The perianal and digital rectal examinations were normal.      Multiple small and large-mouthed diverticula were found in the sigmoid       colon.      A single medium-mouthed diverticulum was found in the distal sigmoid       colon. Purulent discharge was seen in association with the diverticular       opening, consistent with diverticulitis.      The exam was otherwise normal throughout the examined colon.      The retroflexed view of the distal rectum and anal verge was normal and       showed no anal or rectal abnormalities. Impression:               - Diverticulosis in the sigmoid colon.                           - Mild diverticulosis in the distal sigmoid colon.                            Purulent discharge was seen in association with the                            diverticular opening, indicative of  diverticulitis.                           - No specimens collected. Moderate Sedation:      Moderate (conscious) sedation was administered by the endoscopy nurse       and supervised by the endoscopist. The following parameters were       monitored: oxygen saturation, heart rate, blood pressure, CO2       capnography and response to care. Total physician intraservice time was       33 minutes. Recommendation:           - Patient has a contact number available for                            emergencies. The signs and symptoms of potential                            delayed complications were discussed with the                            patient. Return to normal activities tomorrow.                            Written discharge instructions were provided to the                            patient.                           - Low fiber diet for 5 days.                           - Continue present medications.                           -  Metronidazole 500 mg po bid for 10 days.                           - Bactrim- DS one tablet po bid for 10 days.                           - Progress report in 2 weeks. Procedure Code(s):        --- Professional ---                           [phone], Colonoscopy, flexible; diagnostic, including                            collection of specimen(s) by brushing or washing,                            when performed (separate procedure)                           99153, Moderate sedation; each additional 15                            minutes intraservice time                           G0500, Moderate sedation services provided by the                            same physician or other qualified health care                            professional performing a gastrointestinal                            endoscopic service that sedation supports,                            requiring the presence of an independent trained                            observer to assist in  the monitoring of the                            patient's level of consciousness and physiological                            status; initial 15 minutes of intra-service time;                            patient age 1 years or older (additional time may                            be reported with [phone], as appropriate) Diagnosis Code(s):        --- Professional ---  K50.10, Crohn's disease of large intestine without                            complications                           K57.32, Diverticulitis of large intestine without                            perforation or abscess without bleeding CPT copyright 2019 [employer]. All rights reserved. The codes documented in this report are preliminary and upon coder review may  be revised to meet current compliance requirements. Hildred Laser, MD Hildred Laser, MD 01/15/2019 10:21:18 AM This report has been signed electronically. Number of Addenda: 0

## 2019-01-15 NOTE — H&P (Signed)
Annette Briggs is an 78 y.o. female.   Chief Complaint: Patient is here for colonoscopy. 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  Last exam was in August 2014 revealing active disease.  She says she had a bout of diarrhea few weeks ago.  She did not have fever rectal bleeding.  On most days she has 3 stools in the morning.  She denies abdominal pain anorexia or weight loss.  She is on low-dose aspirin but does not take other OTC NSAIDs. History significant for colon carcinoma in paternal aunt who was in her 44s at the time of diagnosis.  Past Medical History:  Diagnosis Date  . Anxiety   . Crohn's colitis ([place])   . Depression   . GERD (gastroesophageal reflux disease)   . Hypertension     Past Surgical History:  Procedure Laterality Date  . APPENDECTOMY    . bladder tac     10 yrs ago  . CHOLECYSTECTOMY    . COLONOSCOPY     2008  . COLONOSCOPY N/A 02/13/2013   Procedure: COLONOSCOPY;  Surgeon: [name], MD;  [facility];  Service: Endoscopy;  Laterality: N/A;  730  . TOTAL ABDOMINAL HYSTERECTOMY      Family History  Problem Relation Age of Onset  . Alzheimer's disease Sister    Social History:  reports that she has never smoked. She has never used smokeless tobacco. She reports that she does not drink alcohol or use drugs.  Allergies:  Allergies  Allergen Reactions  . Ciprofloxacin Other (See Comments)    Patient can't remember reaction.  . Pneumovax [Pneumococcal Polysaccharide Vaccine] Swelling  . Wellbutrin [Bupropion]     Anxiety attack    Medications Prior to Admission  Medication Sig Dispense Refill  . acetaminophen (TYLENOL) 500 MG tablet Take 1,000 mg by mouth every 6 (six) hours as needed for moderate pain or headache.    Marland Kitchen atorvastatin (LIPITOR) 10 MG tablet Take 10 mg by mouth at bedtime.    . calcium carbonate  (TUMS - DOSED IN MG ELEMENTAL CALCIUM) 500 MG chewable tablet Chew 1-2 tablets by mouth daily as needed for indigestion or heartburn.    . Calcium Carbonate-Vitamin D (CALCIUM 500 + D PO) Take 1 tablet by mouth daily.    Marland Kitchen dicyclomine (BENTYL) 10 MG capsule Take 1 capsule (10 mg total) by mouth 2 (two) times daily as needed for spasms. 60 capsule 5  . ibuprofen (ADVIL) 200 MG tablet Take 400 mg by mouth every 6 (six) hours as needed for moderate pain.    . mesalamine (PENTASA) 500 MG CR capsule Take 2 capsules (1,000 mg total) by mouth 3 (three) times daily. (Patient taking differently: Take 1,000-1,500 mg by mouth See admin instructions. Take 1000 mg in the morning and 1500 mg at night) 180 capsule 6  . metoprolol succinate (TOPROL-XL) 25 MG 24 hr tablet Take 12.5 mg by mouth at bedtime.    . Misc Natural Products (GLUCOSAMINE CHONDROITIN TRIPLE) TABS Take 1 tablet by mouth daily.    . Multiple Vitamin (MULTIVITAMIN) tablet Take 1 tablet by mouth daily.    . Multiple Vitamins-Minerals (AIRBORNE) CHEW Chew 1 tablet by mouth daily.    Marland Kitchen OVER THE COUNTER MEDICATION Brain Shield with Konotex -Takes 1 by mouth daily.    . pantoprazole (PROTONIX) 40 MG tablet Take 40 mg by mouth at  bedtime.     Marland Kitchen tobramycin (TOBREX) 0.3 % ophthalmic solution Place 1 drop into the right eye 2 (two) times a day.    . venlafaxine XR (EFFEXOR-XR) 37.5 MG 24 hr capsule Take 37.5 mg by mouth daily.    . diphenhydrAMINE (BENADRYL) 25 MG tablet Take 25 mg by mouth daily as needed for allergies.    Marland Kitchen erythromycin ophthalmic ointment Place 1 application into the right eye daily as needed (stye).    . LORazepam (ATIVAN) 0.5 MG tablet Take 0.25-0.5 mg by mouth as needed for anxiety.       No results found for this or any previous visit (from the past 48 hour(s)). No results found.  ROS  Blood pressure (!) 155/67, pulse 77, temperature 97.8 F (36.6 C), temperature source Oral, resp. rate 16, height 5' 1.5" (1.562 m), weight  62.6 kg, SpO2 100 %. Physical Exam  Constitutional: She appears well-developed and well-nourished.  HENT:  Mouth/Throat: Oropharynx is clear and moist.  Eyes: Conjunctivae are normal. No scleral icterus.  Neck: No thyromegaly present.  Cardiovascular: Normal rate, regular rhythm and normal heart sounds.  No murmur heard. Respiratory: Effort normal and breath sounds normal.  GI: Soft. She exhibits no distension and no mass. There is no abdominal tenderness.  Musculoskeletal:        General: No edema.  Lymphadenopathy:    She has no cervical adenopathy.  Neurological: She is alert.  Skin: Skin is warm and dry.     Assessment/Plan History of Crohn's disease. She has ileocolonic disease. Surveillance colonoscopy.  Hildred Laser, MD 01/15/2019, 9:22 AM

## 2019-01-15 NOTE — Discharge Instructions (Signed)
Resume usual medications as before. Metronidazole 500 mg by mouth twice daily.  Take it after breakfast and evening meal. Bactrim DS 1 tablet by mouth twice daily to be taken after breakfast and evening meal daily. Low residue diet for 5 days and thereafter high-fiber diet. No driving for 24 hours. Please call office with progress port in 2 weeks. Office visit in 1 year.   Colonoscopy, Adult, Care After This sheet gives you information about how to care for yourself after your procedure. Your doctor may also give you more specific instructions. If you have problems or questions, call your doctor. What can I expect after the procedure? After the procedure, it is common to have:  A small amount of blood in your poop for 24 hours.  Some gas.  Mild cramping or bloating in your belly. Follow these instructions at home: General instructions  For the first 24 hours after the procedure: ? Do not drive or use machinery. ? Do not sign important documents. ? Do not drink alcohol. ? Do your daily activities more slowly than normal. ? Eat foods that are soft and easy to digest.  Take over-the-counter or prescription medicines only as told by your doctor. To help cramping and bloating:   Try walking around.  Put heat on your belly (abdomen) as told by your doctor. Use a heat source that your doctor recommends, such as a moist heat pack or a heating pad. ? Put a towel between your skin and the heat source. ? Leave the heat on for 20-30 minutes. ? Remove the heat if your skin turns bright red. This is especially important if you cannot feel pain, heat, or cold. You can get burned. Eating and drinking   Drink enough fluid to keep your pee (urine) clear or pale yellow.  Return to your normal diet as told by your doctor. Avoid heavy or fried foods that are hard to digest.  Avoid drinking alcohol for as long as told by your doctor. Contact a doctor if:  You have blood in your poop  (stool) 2-3 days after the procedure. Get help right away if:  You have more than a small amount of blood in your poop.  You see large clumps of tissue (blood clots) in your poop.  Your belly is swollen.  You feel sick to your stomach (nauseous).  You throw up (vomit).  You have a fever.  You have belly pain that gets worse, and medicine does not help your pain. Summary  After the procedure, it is common to have a small amount of blood in your poop. You may also have mild cramping and bloating in your belly.  For the first 24 hours after the procedure, do not drive or use machinery, do not sign important documents, and do not drink alcohol.  Get help right away if you have a lot of blood in your poop, feel sick to your stomach, have a fever, or have more belly pain. This information is not intended to replace advice given to you by your health care provider. Make sure you discuss any questions you have with your health care provider. Document Released: 07/22/2010 Document Revised: 04/19/2017 Document Reviewed: 03/13/2016 Elsevier Patient Education  [place].  Diverticulitis  Diverticulitis is when small pockets in your large intestine (colon) get infected or swollen. This causes stomach pain and watery poop (diarrhea). These pouches are called diverticula. They form in people who have a condition called diverticulosis. Follow these instructions at home:  Medicines  Take over-the-counter and prescription medicines only as told by your doctor. These include: ? Antibiotics. ? Pain medicines. ? Fiber pills. ? Probiotics. ? Stool softeners.  Do not drive or use heavy machinery while taking prescription pain medicine.  If you were prescribed an antibiotic, take it as told. Do not stop taking it even if you feel better. General instructions   Follow a diet as told by your doctor.  When you feel better, your doctor may tell you to change your diet. You may need to  eat a lot of fiber. Fiber makes it easier to poop (have bowel movements). Healthy foods with fiber include: ? Berries. ? Beans. ? Lentils. ? Green vegetables.  Exercise 3 or more times a week. Aim for 30 minutes each time. Exercise enough to sweat and make your heart beat faster.  Keep all follow-up visits as told. This is important. You may need to have an exam of the large intestine. This is called a colonoscopy. Contact a doctor if:  Your pain does not get better.  You have a hard time eating or drinking.  You are not pooping like normal. Get help right away if:  Your pain gets worse.  Your problems do not get better.  Your problems get worse very fast.  You have a fever.  You throw up (vomit) more than one time.  You have poop that is: ? Bloody. ? Black. ? Tarry. Summary  Diverticulitis is when small pockets in your large intestine (colon) get infected or swollen.  Take medicines only as told by your doctor.  Follow a diet as told by your doctor. This information is not intended to replace advice given to you by your health care provider. Make sure you discuss any questions you have with your health care provider. Document Released: 12/06/2007 Document Revised: 06/01/2017 Document Reviewed: 07/06/2016 Elsevier Patient Education  2020 [employer].  Diverticulosis  Diverticulosis is a condition that develops when small pouches (diverticula) form in the wall of the large intestine (colon). The colon is where water is absorbed and stool is formed. The pouches form when the inside layer of the colon pushes through weak spots in the outer layers of the colon. You may have a few pouches or many of them. What are the causes? The cause of this condition is not known. What increases the risk? The following factors may make you more likely to develop this condition:  Being older than age 25. Your risk for this condition increases with age. Diverticulosis is rare among  people younger than age 39. By age 59, many people have it.  Eating a low-fiber diet.  Having frequent constipation.  Being overweight.  Not getting enough exercise.  Smoking.  Taking over-the-counter pain medicines, like aspirin and ibuprofen.  Having a family history of diverticulosis. What are the signs or symptoms? In most people, there are no symptoms of this condition. If you do have symptoms, they may include:  Bloating.  Cramps in the abdomen.  Constipation or diarrhea.  Pain in the lower left side of the abdomen. How is this diagnosed? This condition is most often diagnosed during an exam for other colon problems. Because diverticulosis usually has no symptoms, it often cannot be diagnosed independently. This condition may be diagnosed by:  Using a flexible scope to examine the colon (colonoscopy).  Taking an X-ray of the colon after dye has been put into the colon (barium enema).  Doing a CT scan. How is this  treated? You may not need treatment for this condition if you have never developed an infection related to diverticulosis. If you have had an infection before, treatment may include:  Eating a high-fiber diet. This may include eating more fruits, vegetables, and grains.  Taking a fiber supplement.  Taking a live bacteria supplement (probiotic).  Taking medicine to relax your colon.  Taking antibiotic medicines. Follow these instructions at home:  Drink 6-8 glasses of water or more each day to prevent constipation.  Try not to strain when you have a bowel movement.  If you have had an infection before: ? Eat more fiber as directed by your health care provider or your diet and nutrition specialist (dietitian). ? Take a fiber supplement or probiotic, if your health care provider approves.  Take over-the-counter and prescription medicines only as told by your health care provider.  If you were prescribed an antibiotic, take it as told by your health  care provider. Do not stop taking the antibiotic even if you start to feel better.  Keep all follow-up visits as told by your health care provider. This is important. Contact a health care provider if:  You have pain in your abdomen.  You have bloating.  You have cramps.  You have not had a bowel movement in 3 days. Get help right away if:  Your pain gets worse.  Your bloating becomes very bad.  You have a fever or chills, and your symptoms suddenly get worse.  You vomit.  You have bowel movements that are bloody or black.  You have bleeding from your rectum. Summary  Diverticulosis is a condition that develops when small pouches (diverticula) form in the wall of the large intestine (colon).  You may have a few pouches or many of them.  This condition is most often diagnosed during an exam for other colon problems.  If you have had an infection related to diverticulosis, treatment may include increasing the fiber in your diet, taking supplements, or taking medicines. This information is not intended to replace advice given to you by your health care provider. Make sure you discuss any questions you have with your health care provider. Document Released: 03/16/2004 Document Revised: 06/01/2017 Document Reviewed: 05/08/2016 Elsevier Patient Education  2020 [employer].

## 2019-01-21 ENCOUNTER — Encounter (HOSPITAL_COMMUNITY): Payer: Self-pay | Admitting: Internal Medicine

## 2019-02-03 ENCOUNTER — Telehealth (INDEPENDENT_AMBULATORY_CARE_PROVIDER_SITE_OTHER): Payer: Self-pay | Admitting: Internal Medicine

## 2019-02-03 NOTE — Telephone Encounter (Signed)
Patient called stated she has some questions about her diet and a medication she is on - ph# [phone]

## 2019-02-03 NOTE — Telephone Encounter (Signed)
Patient was called and a message was left for her to call the office back.

## 2019-02-10 ENCOUNTER — Telehealth (INDEPENDENT_AMBULATORY_CARE_PROVIDER_SITE_OTHER): Payer: Self-pay | Admitting: Internal Medicine

## 2019-02-10 NOTE — Telephone Encounter (Signed)
Patient called wants to know if she is to take the full dose of pantoprazole - also wants to know if she can eat wheat products and raw veggies - ph# [phone]

## 2019-02-11 NOTE — Telephone Encounter (Signed)
Per Dr.Rehman the patient  Should be taking the full dose pantoprazole. She may add the vegetables for 1-2 weeks then add the wheat products. This way , it will help if she should have issues with one , as to which one was thee culprit. Patient was called and a message was left on her voicemail with the above instructions.

## 2019-02-11 NOTE — Telephone Encounter (Signed)
See previous telephone encounter.

## 2019-03-26 DIAGNOSIS — Z0000 Encounter for general adult medical examination without abnormal findings: Secondary | ICD-10-CM | POA: Diagnosis not present

## 2019-03-26 DIAGNOSIS — M1712 Unilateral primary osteoarthritis, left knee: Secondary | ICD-10-CM | POA: Diagnosis not present

## 2019-03-26 DIAGNOSIS — M545 Low back pain: Secondary | ICD-10-CM | POA: Diagnosis not present

## 2019-03-26 DIAGNOSIS — M25552 Pain in left hip: Secondary | ICD-10-CM | POA: Diagnosis not present

## 2019-05-13 DIAGNOSIS — Z0000 Encounter for general adult medical examination without abnormal findings: Secondary | ICD-10-CM | POA: Diagnosis not present

## 2019-05-13 DIAGNOSIS — Z23 Encounter for immunization: Secondary | ICD-10-CM | POA: Diagnosis not present

## 2019-07-15 DIAGNOSIS — K5000 Crohn's disease of small intestine without complications: Secondary | ICD-10-CM | POA: Diagnosis not present

## 2019-07-15 DIAGNOSIS — Z23 Encounter for immunization: Secondary | ICD-10-CM | POA: Diagnosis not present

## 2019-07-15 DIAGNOSIS — E782 Mixed hyperlipidemia: Secondary | ICD-10-CM | POA: Diagnosis not present

## 2019-07-15 DIAGNOSIS — M25559 Pain in unspecified hip: Secondary | ICD-10-CM | POA: Diagnosis not present

## 2019-07-15 DIAGNOSIS — E7849 Other hyperlipidemia: Secondary | ICD-10-CM | POA: Diagnosis not present

## 2019-07-15 DIAGNOSIS — R202 Paresthesia of skin: Secondary | ICD-10-CM | POA: Diagnosis not present

## 2019-07-15 DIAGNOSIS — I10 Essential (primary) hypertension: Secondary | ICD-10-CM | POA: Diagnosis not present

## 2019-07-21 DIAGNOSIS — E782 Mixed hyperlipidemia: Secondary | ICD-10-CM | POA: Diagnosis not present

## 2019-07-21 DIAGNOSIS — I10 Essential (primary) hypertension: Secondary | ICD-10-CM | POA: Diagnosis not present

## 2019-07-22 DIAGNOSIS — K219 Gastro-esophageal reflux disease without esophagitis: Secondary | ICD-10-CM | POA: Diagnosis not present

## 2019-07-22 DIAGNOSIS — E782 Mixed hyperlipidemia: Secondary | ICD-10-CM | POA: Diagnosis not present

## 2019-07-22 DIAGNOSIS — F331 Major depressive disorder, recurrent, moderate: Secondary | ICD-10-CM | POA: Diagnosis not present

## 2019-07-22 DIAGNOSIS — I10 Essential (primary) hypertension: Secondary | ICD-10-CM | POA: Diagnosis not present

## 2019-07-22 DIAGNOSIS — K5090 Crohn's disease, unspecified, without complications: Secondary | ICD-10-CM | POA: Diagnosis not present

## 2019-09-29 DIAGNOSIS — E782 Mixed hyperlipidemia: Secondary | ICD-10-CM | POA: Diagnosis not present

## 2019-09-29 DIAGNOSIS — R202 Paresthesia of skin: Secondary | ICD-10-CM | POA: Diagnosis not present

## 2019-09-29 DIAGNOSIS — Z23 Encounter for immunization: Secondary | ICD-10-CM | POA: Diagnosis not present

## 2019-09-29 DIAGNOSIS — I10 Essential (primary) hypertension: Secondary | ICD-10-CM | POA: Diagnosis not present

## 2019-09-29 DIAGNOSIS — K5000 Crohn's disease of small intestine without complications: Secondary | ICD-10-CM | POA: Diagnosis not present

## 2019-09-29 DIAGNOSIS — E7849 Other hyperlipidemia: Secondary | ICD-10-CM | POA: Diagnosis not present

## 2019-10-14 ENCOUNTER — Ambulatory Visit (INDEPENDENT_AMBULATORY_CARE_PROVIDER_SITE_OTHER): Payer: Medicare HMO | Admitting: Nurse Practitioner

## 2019-11-19 ENCOUNTER — Ambulatory Visit (INDEPENDENT_AMBULATORY_CARE_PROVIDER_SITE_OTHER): Payer: Medicare HMO | Admitting: Gastroenterology

## 2019-12-12 DIAGNOSIS — I10 Essential (primary) hypertension: Secondary | ICD-10-CM | POA: Diagnosis not present

## 2019-12-12 DIAGNOSIS — Z23 Encounter for immunization: Secondary | ICD-10-CM | POA: Diagnosis not present

## 2019-12-12 DIAGNOSIS — K5000 Crohn's disease of small intestine without complications: Secondary | ICD-10-CM | POA: Diagnosis not present

## 2019-12-12 DIAGNOSIS — R202 Paresthesia of skin: Secondary | ICD-10-CM | POA: Diagnosis not present

## 2019-12-12 DIAGNOSIS — E782 Mixed hyperlipidemia: Secondary | ICD-10-CM | POA: Diagnosis not present

## 2019-12-12 DIAGNOSIS — E7849 Other hyperlipidemia: Secondary | ICD-10-CM | POA: Diagnosis not present

## 2019-12-12 DIAGNOSIS — M25559 Pain in unspecified hip: Secondary | ICD-10-CM | POA: Diagnosis not present

## 2019-12-29 ENCOUNTER — Ambulatory Visit (INDEPENDENT_AMBULATORY_CARE_PROVIDER_SITE_OTHER): Payer: Medicare HMO | Admitting: Gastroenterology

## 2020-01-22 ENCOUNTER — Other Ambulatory Visit (HOSPITAL_COMMUNITY): Payer: Self-pay | Admitting: Internal Medicine

## 2020-01-22 DIAGNOSIS — Z1231 Encounter for screening mammogram for malignant neoplasm of breast: Secondary | ICD-10-CM

## 2020-01-23 DIAGNOSIS — E782 Mixed hyperlipidemia: Secondary | ICD-10-CM | POA: Diagnosis not present

## 2020-01-23 DIAGNOSIS — K5000 Crohn's disease of small intestine without complications: Secondary | ICD-10-CM | POA: Diagnosis not present

## 2020-01-23 DIAGNOSIS — Z712 Person consulting for explanation of examination or test findings: Secondary | ICD-10-CM | POA: Diagnosis not present

## 2020-01-23 DIAGNOSIS — I10 Essential (primary) hypertension: Secondary | ICD-10-CM | POA: Diagnosis not present

## 2020-01-23 DIAGNOSIS — Z1321 Encounter for screening for nutritional disorder: Secondary | ICD-10-CM | POA: Diagnosis not present

## 2020-01-27 DIAGNOSIS — M25559 Pain in unspecified hip: Secondary | ICD-10-CM | POA: Diagnosis not present

## 2020-01-27 DIAGNOSIS — I10 Essential (primary) hypertension: Secondary | ICD-10-CM | POA: Diagnosis not present

## 2020-01-27 DIAGNOSIS — F331 Major depressive disorder, recurrent, moderate: Secondary | ICD-10-CM | POA: Diagnosis not present

## 2020-01-27 DIAGNOSIS — Z23 Encounter for immunization: Secondary | ICD-10-CM | POA: Diagnosis not present

## 2020-01-27 DIAGNOSIS — R079 Chest pain, unspecified: Secondary | ICD-10-CM | POA: Diagnosis not present

## 2020-01-27 DIAGNOSIS — Z0001 Encounter for general adult medical examination with abnormal findings: Secondary | ICD-10-CM | POA: Diagnosis not present

## 2020-01-27 DIAGNOSIS — K219 Gastro-esophageal reflux disease without esophagitis: Secondary | ICD-10-CM | POA: Diagnosis not present

## 2020-01-27 DIAGNOSIS — E7849 Other hyperlipidemia: Secondary | ICD-10-CM | POA: Diagnosis not present

## 2020-01-27 DIAGNOSIS — Z136 Encounter for screening for cardiovascular disorders: Secondary | ICD-10-CM | POA: Diagnosis not present

## 2020-01-27 DIAGNOSIS — K5090 Crohn's disease, unspecified, without complications: Secondary | ICD-10-CM | POA: Diagnosis not present

## 2020-01-27 DIAGNOSIS — E782 Mixed hyperlipidemia: Secondary | ICD-10-CM | POA: Diagnosis not present

## 2020-01-27 DIAGNOSIS — R202 Paresthesia of skin: Secondary | ICD-10-CM | POA: Diagnosis not present

## 2020-01-27 DIAGNOSIS — K5000 Crohn's disease of small intestine without complications: Secondary | ICD-10-CM | POA: Diagnosis not present

## 2020-01-28 ENCOUNTER — Emergency Department (HOSPITAL_COMMUNITY): Payer: Medicare HMO

## 2020-01-28 ENCOUNTER — Other Ambulatory Visit: Payer: Self-pay

## 2020-01-28 ENCOUNTER — Encounter (HOSPITAL_COMMUNITY): Payer: Self-pay | Admitting: *Deleted

## 2020-01-28 ENCOUNTER — Emergency Department (HOSPITAL_COMMUNITY)
Admission: EM | Admit: 2020-01-28 | Discharge: 2020-01-28 | Disposition: A | Discharge status: Home / Self Care | Payer: Medicare HMO | Attending: Emergency Medicine | Admitting: Emergency Medicine

## 2020-01-28 ENCOUNTER — Encounter: Payer: Self-pay | Admitting: Cardiology

## 2020-01-28 DIAGNOSIS — R072 Precordial pain: Secondary | ICD-10-CM | POA: Diagnosis not present

## 2020-01-28 DIAGNOSIS — I10 Essential (primary) hypertension: Secondary | ICD-10-CM | POA: Diagnosis not present

## 2020-01-28 DIAGNOSIS — Z79899 Other long term (current) drug therapy: Secondary | ICD-10-CM | POA: Diagnosis not present

## 2020-01-28 DIAGNOSIS — R079 Chest pain, unspecified: Secondary | ICD-10-CM | POA: Diagnosis not present

## 2020-01-28 LAB — CBC
HCT: 40.9 % (ref 36.0–46.0)
Hemoglobin: 13.3 g/dL (ref 12.0–15.0)
MCH: 29.4 pg (ref 26.0–34.0)
MCHC: 32.5 g/dL (ref 30.0–36.0)
MCV: 90.3 fL (ref 80.0–100.0)
Platelets: 204 10*3/uL (ref 150–400)
RBC: 4.53 MIL/uL (ref 3.87–5.11)
RDW: 13.5 % (ref 11.5–15.5)
WBC: 5.3 10*3/uL (ref 4.0–10.5)
nRBC: 0 % (ref 0.0–0.2)

## 2020-01-28 LAB — BASIC METABOLIC PANEL
Anion gap: 9 (ref 5–15)
BUN: 14 mg/dL (ref 8–23)
CO2: 26 mmol/L (ref 22–32)
Calcium: 9.2 mg/dL (ref 8.9–10.3)
Chloride: 105 mmol/L (ref 98–111)
Creatinine, Ser: 0.71 mg/dL (ref 0.44–1.00)
GFR calc Af Amer: >60 mL/min (ref >60)
GFR calc non Af Amer: >60 mL/min (ref >60)
Glucose, Bld: 100 mg/dL — ABNORMAL HIGH (ref 70–99)
Potassium: 3.8 mmol/L (ref 3.5–5.1)
Sodium: 140 mmol/L (ref 135–145)

## 2020-01-28 LAB — TROPONIN I (HIGH SENSITIVITY)
Troponin I (High Sensitivity): 4 ng/L (ref <18)
Troponin I (High Sensitivity): 5 ng/L (ref <18)

## 2020-01-28 IMAGING — DX DG CHEST 2V
2 series · 2 of 2 positions shown · non-contrast
Comparison: None.

CLINICAL DATA: Chest pain.

EXAM:
CHEST - 2 VIEW

[chest pa]
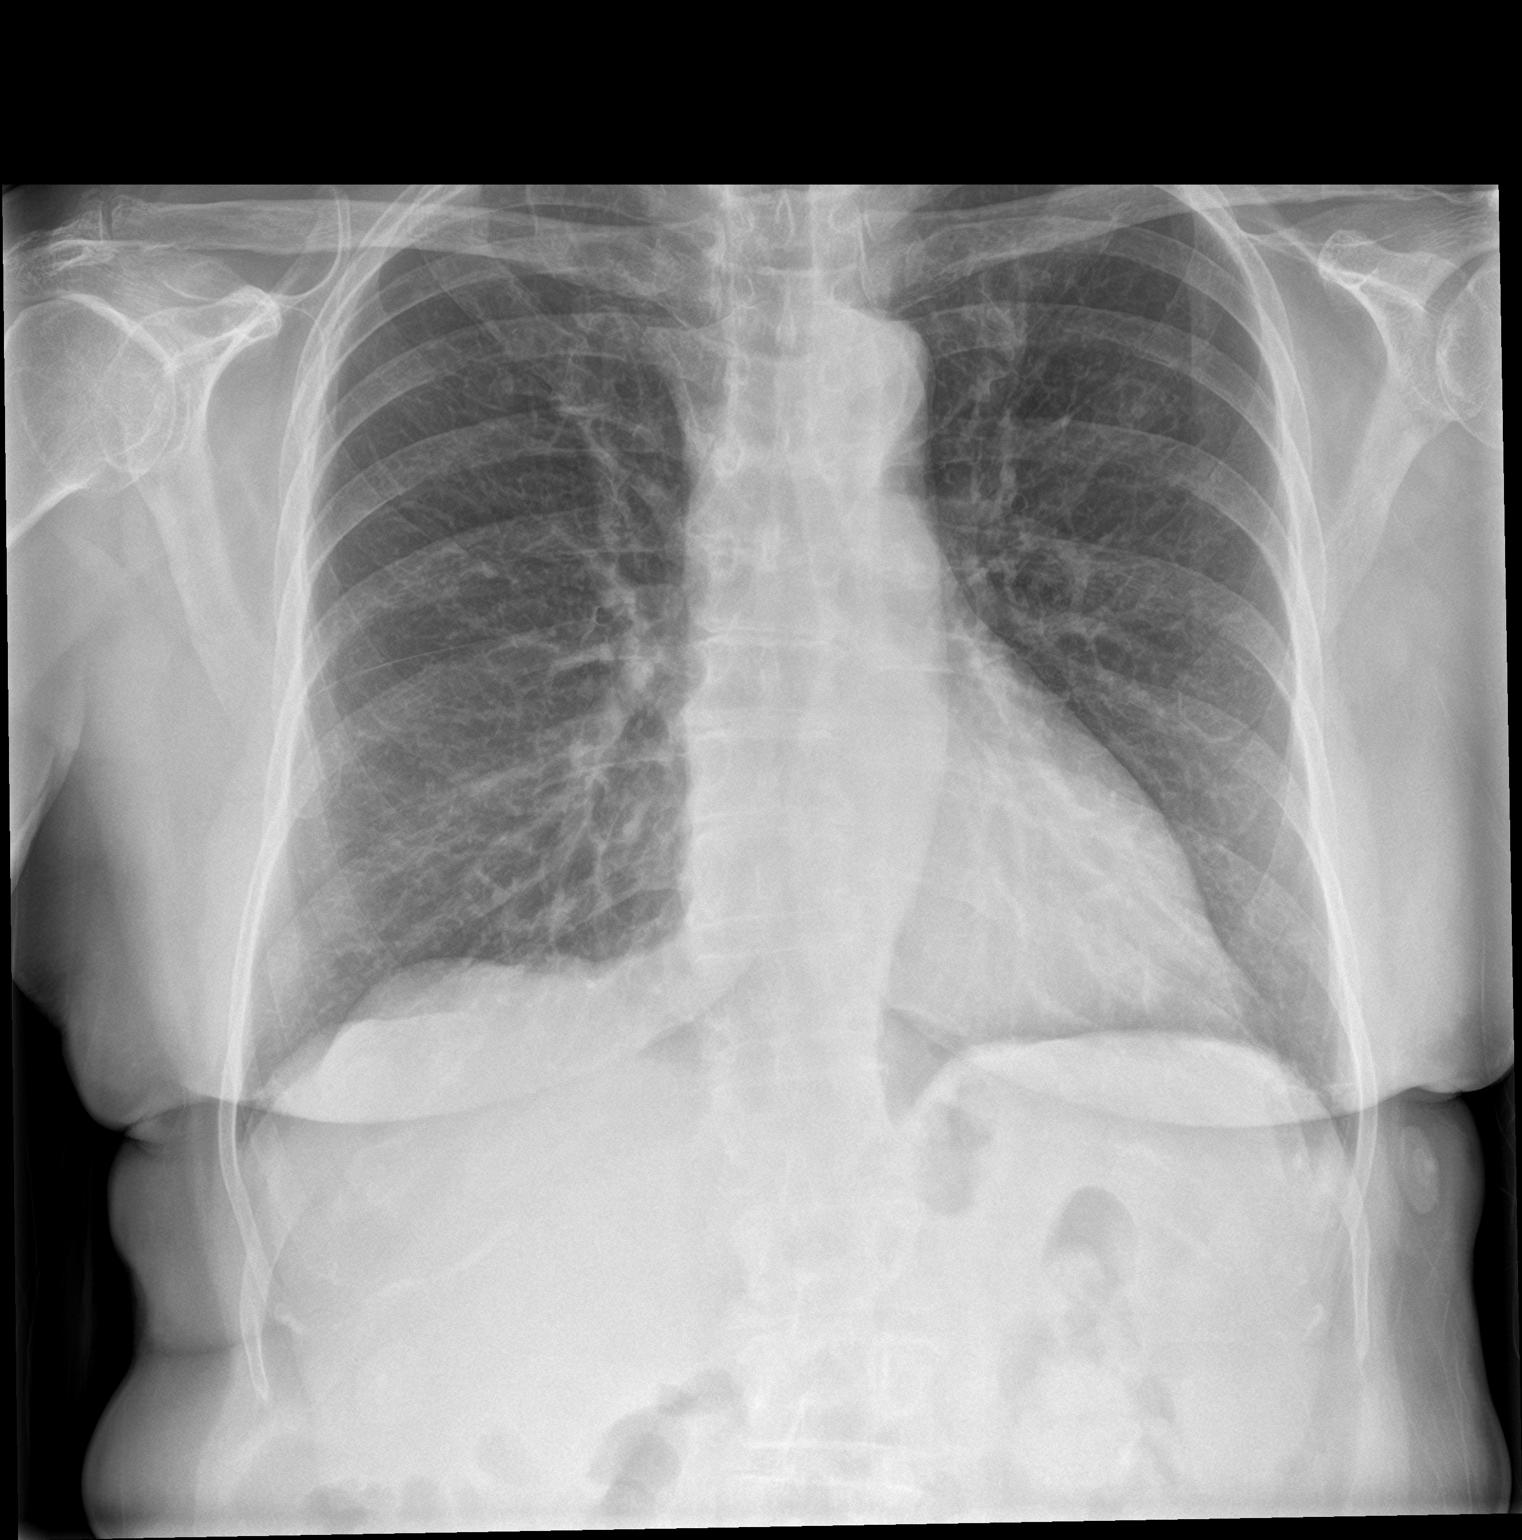

[chest lat]
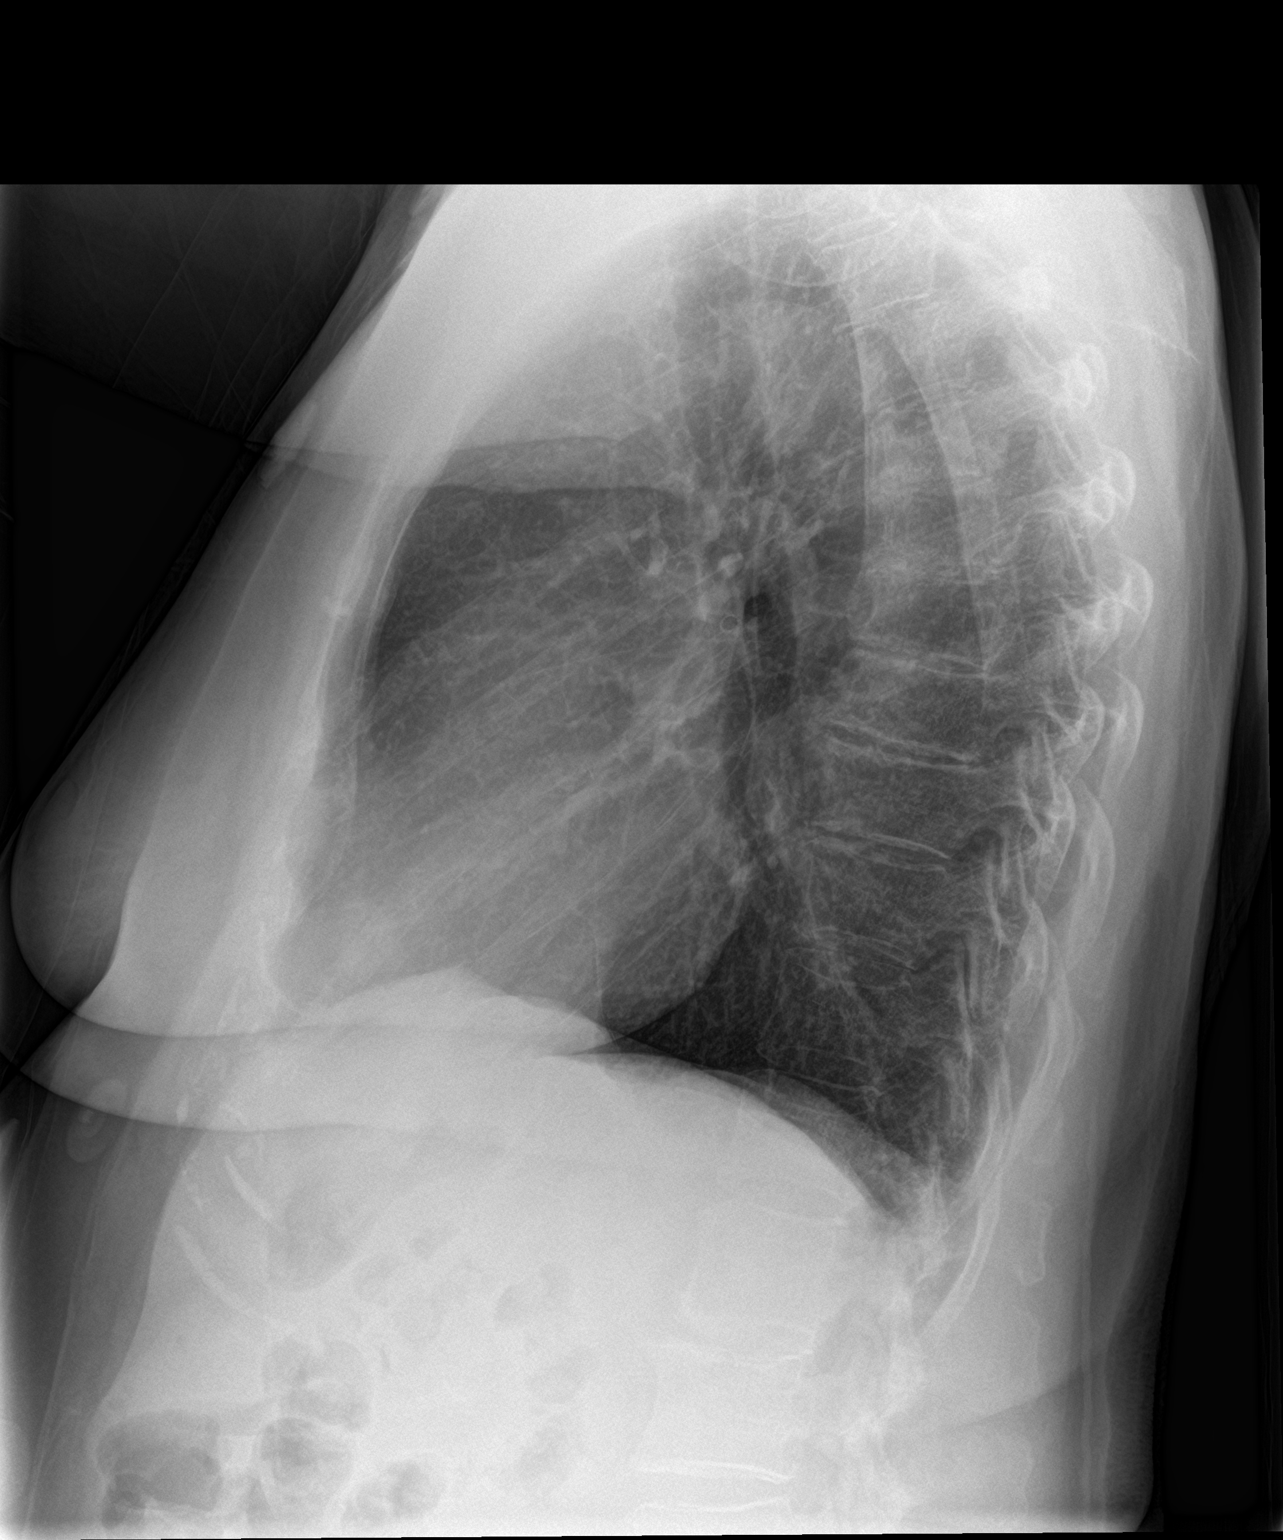

[2 of 2 positions shown; findings below may reference images not displayed]

FINDINGS: The heart size and mediastinal contours are within normal limits.
Both lungs are clear. No pneumothorax or pleural effusion is noted.
The visualized skeletal structures are unremarkable.
IMPRESSION: No active cardiopulmonary disease.

## 2020-01-28 MED ORDER — SODIUM CHLORIDE 0.9% FLUSH: 3.0000 mL | Freq: Once | INTRAVENOUS | Status: DC

## 2020-01-28 MED ORDER — ASPIRIN 81 MG PO CHEW
324.0000 mg | CHEWABLE_TABLET | Freq: Once | ORAL | Status: AC
  Administered 2020-01-28: 324 mg via ORAL
  Filled 2020-01-28: qty 4

## 2020-01-28 NOTE — Progress Notes (Signed)
Cardiology Office Note  Date: 01/29/2020   ID: Annette Briggs, Annette Briggs 08-Apr-1941, MRN [id number]  PCP:  Celene Squibb, MD  Cardiologist:  Rozann Lesches, MD Electrophysiologist:  None   Chief Complaint  Patient presents with  . Exertional chest pain    History of Present Illness: Annette Briggs is an 79 y.o. female referred for cardiology consultation by Dr. Kathrynn Humble for the evaluation of chest pain.   She was seen in the ER on July 28 with chest discomfort that occurred while playing pickle ball.  High-sensitivity troponin I levels were normal, ECG and chest x-ray reviewed below.  Records also show that he was she was seen by her PCP on July 27 complaining of exertional chest pain and referred at that time for outpatient cardiology consultation.  She tells me that she has been experiencing exertional chest tightness for the last few months.  Initially noticed this when she was walking for exercise while staying with her son out of town.  More recently she has been playing pickle ball and has had consistent exertional chest tightness with activity, much worse yesterday and prolonged even though she rested more.  No sense of palpitations, no syncope.  Records indicate previous evaluations by Dr. Harl Bowie and also Dr. Harrington Challenger, most recently 2017.  She was seen for chest pain and palpitations at that time.  No ischemic testing undertaken.  She also reports having cardiology evaluation at a facility in [place] several years ago, underwent cardiac catheterization greater than 10 years ago reportedly without significant findings.  She has a history of Crohn's colitis, states that this has been symptomatically stable on medical therapy.  She has seen Dr.  Laural Golden for this.  Does not describe any GI bleeding.  Previously on aspirin but stopped taking it regularly due to bruising.  Past Medical History:  Diagnosis Date  . Anxiety   . Crohn's colitis ([place])   . Depression   . Essential hypertension   .  GERD (gastroesophageal reflux disease)     Past Surgical History:  Procedure Laterality Date  . APPENDECTOMY    . BLADDER SUSPENSION    . CHOLECYSTECTOMY    . COLONOSCOPY     2008  . COLONOSCOPY N/A 02/13/2013   Procedure: COLONOSCOPY;  Surgeon: [name], MD;  [facility];  Service: Endoscopy;  Laterality: N/A;  730  . COLONOSCOPY N/A 01/15/2019   Procedure: COLONOSCOPY;  Surgeon: [name], MD;  [facility];  Service: Endoscopy;  Laterality: N/A;  100  . TOTAL ABDOMINAL HYSTERECTOMY      Current Outpatient Medications  Medication Sig Dispense Refill  . acetaminophen (TYLENOL) 500 MG tablet Take 1,000 mg by mouth every 6 (six) hours as needed for moderate pain or headache.    Marland Kitchen atorvastatin (LIPITOR) 10 MG tablet Take 10 mg by mouth at bedtime.    . calcium carbonate (TUMS - DOSED IN MG ELEMENTAL CALCIUM) 500 MG chewable tablet Chew 1-2 tablets by mouth daily as needed for indigestion or heartburn.    . Calcium Carbonate-Vitamin D (CALCIUM 500 + D PO) Take 1 tablet by mouth daily.    Marland Kitchen dicyclomine (BENTYL) 10 MG capsule Take 1 capsule (10 mg total) by mouth 2 (two) times daily as needed for spasms. 60 capsule 5  . mesalamine (PENTASA) 500 MG CR capsule Take 2 capsules (1,000 mg total) by mouth 3 (three) times daily. (Patient taking differently: Take 1,000-1,500 mg by mouth See admin instructions.  Take 1000 mg in the morning and 1500 mg at night) 180 capsule 6  . metoprolol succinate (TOPROL-XL) 25 MG 24 hr tablet Take 12.5 mg by mouth at bedtime.    . Multiple Vitamin (MULTIVITAMIN) tablet Take 1 tablet by mouth daily.    . Multiple Vitamins-Minerals (AIRBORNE) CHEW Chew 1 tablet by mouth daily.    Marland Kitchen OVER THE COUNTER MEDICATION Brain Shield with Konotex -Takes 1 by mouth daily.    . pantoprazole (PROTONIX) 40 MG tablet Take 40 mg by mouth at bedtime.     Marland Kitchen tobramycin (TOBREX) 0.3 % ophthalmic solution Place 1 drop into the right eye 2 (two) times a  day.    . venlafaxine XR (EFFEXOR-XR) 37.5 MG 24 hr capsule Take 37.5 mg by mouth daily.    Marland Kitchen aspirin EC 81 MG tablet Take 1 tablet (81 mg total) by mouth daily. Swallow whole. 90 tablet 3   No current facility-administered medications for this visit.   Allergies:  Ciprofloxacin, Pneumovax [pneumococcal polysaccharide vaccine], and Wellbutrin [bupropion]   Social History: The patient  reports that she has never smoked. She has never used smokeless tobacco. She reports that she does not drink alcohol and does not use drugs.   Family History: The patient's family history includes Alzheimer's disease in her sister; Breast cancer in her sister; Heart failure in her mother; Kidney failure in her father.   ROS:   No palpitations or syncope.  Physical Exam: VS:  BP 126/68 (BP Location: Right Arm)   Pulse 82   Ht 5' 1.5" (1.562 m)   Wt 144 lb (65.3 kg)   SpO2 95%   BMI 26.77 kg/m , BMI Body mass index is 26.77 kg/m.  Wt Readings from Last 3 Encounters:  01/29/20 144 lb (65.3 kg)  01/28/20 143 lb (64.9 kg)  01/15/19 138 lb (62.6 kg)    General: Patient appears comfortable at rest. HEENT: Conjunctiva and lids normal, wearing a mask. Neck: Supple, no elevated JVP or carotid bruits, no thyromegaly. Lungs: Clear to auscultation, nonlabored breathing at rest. Cardiac: Regular rate and rhythm, no S3, soft systolic murmur, no pericardial rub. Abdomen: Soft, nontender, bowel sounds present. Extremities: No pitting edema, distal pulses 2+. Skin: Warm and dry. Musculoskeletal: No kyphosis. Neuropsychiatric: Alert and oriented x3, affect grossly appropriate.  ECG:  An ECG dated 01/28/2020 was personally reviewed today and demonstrated:  Sinus rhythm with decreased R wave progression, nonspecific ST changes, inferolateral depression more prominent than in tracing from 2017.  Recent Labwork: 01/28/2020: BUN 14; Creatinine, Ser 0.71; Hemoglobin 13.3; Platelets 204; Potassium 3.8; Sodium 140    Other Studies Reviewed Today:  Chest x-ray 01/28/2020: FINDINGS: The heart size and mediastinal contours are within normal limits. Both lungs are clear. No pneumothorax or pleural effusion is noted. The visualized skeletal structures are unremarkable.  IMPRESSION: No active cardiopulmonary disease.  Echocardiogram 01/26/2010 [facility]): LVEF 65 to 70% with mild diastolic dysfunction, normal left atrial chamber size, normal RV contraction, mildly sclerotic aortic valve without stenosis, trace mitral regurgitation, mild tricuspid regurgitation with estimated RVSP 28 mmHg, mild pulmonic regurgitation, no pericardial effusion, normal aortic root size  Assessment and Plan:  1.  Symptoms consistent with accelerating exertional angina in a functionally active 79 year old woman with history of hypertension and hyperlipidemia.  Recent ER work-up reviewed.  ECG did show lateral ST segment depression somewhat worse than prior tracings, high-sensitivity troponin I levels were normal.  Chest x-ray without acute findings.  We have discussed diagnostic options and  after reviewing the risks and benefits, plan is to proceed with a diagnostic cardiac catheterization for clear definition of coronary anatomy and assessment for revascularization options.  She is in agreement to proceed.  Starting aspirin 81 mg daily, continue Toprol-XL and Lipitor.  2.  Essential hypertension by history.  She is currently on Toprol-XL.  3.  History of Crohn's colitis and GERD.  She follows with Dr. Laural Golden.  Currently on Protonix, Bentyl, and Pentasa.  Medication Adjustments/Labs and Tests Ordered: Current medicines are reviewed at length with the patient today.  Concerns regarding medicines are outlined above.   Tests Ordered: No orders of the defined types were placed in this encounter.   Medication Changes: Meds ordered this encounter  Medications  . aspirin EC 81 MG tablet    Sig: Take 1 tablet (81 mg  total) by mouth daily. Swallow whole.    Dispense:  90 tablet    Refill:  3    Disposition:  Follow up after procedure.  Signed, Satira Sark, MD, [facility] 01/29/2020 11:23 AM    [facility]. [address] [place], [place] [address] Phone: [phone]; Fax: [phone]

## 2020-01-28 NOTE — ED Triage Notes (Signed)
Pt c/o chest pain while playing pickle ball

## 2020-01-28 NOTE — H&P (View-Only) (Signed)
Cardiology Office Note  Date: 01/29/2020   ID: Annette, Briggs 25-Mar-1941, MRN [id number]  PCP:  Celene Squibb, MD  Cardiologist:  Rozann Lesches, MD Electrophysiologist:  None   Chief Complaint  Patient presents with  . Exertional chest pain    History of Present Illness: Annette Briggs is an 79 y.o. female referred for cardiology consultation by Dr. Kathrynn Humble for the evaluation of chest pain.   She was seen in the ER on July 28 with chest discomfort that occurred while playing pickle ball.  High-sensitivity troponin I levels were normal, ECG and chest x-ray reviewed below.  Records also show that he was she was seen by her PCP on July 27 complaining of exertional chest pain and referred at that time for outpatient cardiology consultation.  She tells me that she has been experiencing exertional chest tightness for the last few months.  Initially noticed this when she was walking for exercise while staying with her son out of town.  More recently she has been playing pickle ball and has had consistent exertional chest tightness with activity, much worse yesterday and prolonged even though she rested more.  No sense of palpitations, no syncope.  Records indicate previous evaluations by Dr. Harl Bowie and also Dr. Harrington Challenger, most recently 2017.  She was seen for chest pain and palpitations at that time.  No ischemic testing undertaken.  She also reports having cardiology evaluation at a facility in [place] several years ago, underwent cardiac catheterization greater than 10 years ago reportedly without significant findings.  She has a history of Crohn's colitis, states that this has been symptomatically stable on medical therapy.  She has seen Dr.  Laural Golden for this.  Does not describe any GI bleeding.  Previously on aspirin but stopped taking it regularly due to bruising.  Past Medical History:  Diagnosis Date  . Anxiety   . Crohn's colitis ([place])   . Depression   . Essential hypertension   .  GERD (gastroesophageal reflux disease)     Past Surgical History:  Procedure Laterality Date  . APPENDECTOMY    . BLADDER SUSPENSION    . CHOLECYSTECTOMY    . COLONOSCOPY     2008  . COLONOSCOPY N/A 02/13/2013   Procedure: COLONOSCOPY;  Surgeon: [name], MD;  [facility];  Service: Endoscopy;  Laterality: N/A;  730  . COLONOSCOPY N/A 01/15/2019   Procedure: COLONOSCOPY;  Surgeon: [name], MD;  [facility];  Service: Endoscopy;  Laterality: N/A;  100  . TOTAL ABDOMINAL HYSTERECTOMY      Current Outpatient Medications  Medication Sig Dispense Refill  . acetaminophen (TYLENOL) 500 MG tablet Take 1,000 mg by mouth every 6 (six) hours as needed for moderate pain or headache.    Marland Kitchen atorvastatin (LIPITOR) 10 MG tablet Take 10 mg by mouth at bedtime.    . calcium carbonate (TUMS - DOSED IN MG ELEMENTAL CALCIUM) 500 MG chewable tablet Chew 1-2 tablets by mouth daily as needed for indigestion or heartburn.    . Calcium Carbonate-Vitamin D (CALCIUM 500 + D PO) Take 1 tablet by mouth daily.    Marland Kitchen dicyclomine (BENTYL) 10 MG capsule Take 1 capsule (10 mg total) by mouth 2 (two) times daily as needed for spasms. 60 capsule 5  . mesalamine (PENTASA) 500 MG CR capsule Take 2 capsules (1,000 mg total) by mouth 3 (three) times daily. (Patient taking differently: Take 1,000-1,500 mg by mouth See admin instructions.  Take 1000 mg in the morning and 1500 mg at night) 180 capsule 6  . metoprolol succinate (TOPROL-XL) 25 MG 24 hr tablet Take 12.5 mg by mouth at bedtime.    . Multiple Vitamin (MULTIVITAMIN) tablet Take 1 tablet by mouth daily.    . Multiple Vitamins-Minerals (AIRBORNE) CHEW Chew 1 tablet by mouth daily.    Marland Kitchen OVER THE COUNTER MEDICATION Brain Shield with Konotex -Takes 1 by mouth daily.    . pantoprazole (PROTONIX) 40 MG tablet Take 40 mg by mouth at bedtime.     Marland Kitchen tobramycin (TOBREX) 0.3 % ophthalmic solution Place 1 drop into the right eye 2 (two) times a  day.    . venlafaxine XR (EFFEXOR-XR) 37.5 MG 24 hr capsule Take 37.5 mg by mouth daily.    Marland Kitchen aspirin EC 81 MG tablet Take 1 tablet (81 mg total) by mouth daily. Swallow whole. 90 tablet 3   No current facility-administered medications for this visit.   Allergies:  Ciprofloxacin, Pneumovax [pneumococcal polysaccharide vaccine], and Wellbutrin [bupropion]   Social History: The patient  reports that she has never smoked. She has never used smokeless tobacco. She reports that she does not drink alcohol and does not use drugs.   Family History: The patient's family history includes Alzheimer's disease in her sister; Breast cancer in her sister; Heart failure in her mother; Kidney failure in her father.   ROS:   No palpitations or syncope.  Physical Exam: VS:  BP 126/68 (BP Location: Right Arm)   Pulse 82   Ht 5' 1.5" (1.562 m)   Wt 144 lb (65.3 kg)   SpO2 95%   BMI 26.77 kg/m , BMI Body mass index is 26.77 kg/m.  Wt Readings from Last 3 Encounters:  01/29/20 144 lb (65.3 kg)  01/28/20 143 lb (64.9 kg)  01/15/19 138 lb (62.6 kg)    General: Patient appears comfortable at rest. HEENT: Conjunctiva and lids normal, wearing a mask. Neck: Supple, no elevated JVP or carotid bruits, no thyromegaly. Lungs: Clear to auscultation, nonlabored breathing at rest. Cardiac: Regular rate and rhythm, no S3, soft systolic murmur, no pericardial rub. Abdomen: Soft, nontender, bowel sounds present. Extremities: No pitting edema, distal pulses 2+. Skin: Warm and dry. Musculoskeletal: No kyphosis. Neuropsychiatric: Alert and oriented x3, affect grossly appropriate.  ECG:  An ECG dated 01/28/2020 was personally reviewed today and demonstrated:  Sinus rhythm with decreased R wave progression, nonspecific ST changes, inferolateral depression more prominent than in tracing from 2017.  Recent Labwork: 01/28/2020: BUN 14; Creatinine, Ser 0.71; Hemoglobin 13.3; Platelets 204; Potassium 3.8; Sodium 140    Other Studies Reviewed Today:  Chest x-ray 01/28/2020: FINDINGS: The heart size and mediastinal contours are within normal limits. Both lungs are clear. No pneumothorax or pleural effusion is noted. The visualized skeletal structures are unremarkable.  IMPRESSION: No active cardiopulmonary disease.  Echocardiogram 01/26/2010 [facility]): LVEF 65 to 70% with mild diastolic dysfunction, normal left atrial chamber size, normal RV contraction, mildly sclerotic aortic valve without stenosis, trace mitral regurgitation, mild tricuspid regurgitation with estimated RVSP 28 mmHg, mild pulmonic regurgitation, no pericardial effusion, normal aortic root size  Assessment and Plan:  1.  Symptoms consistent with accelerating exertional angina in a functionally active 79 year old woman with history of hypertension and hyperlipidemia.  Recent ER work-up reviewed.  ECG did show lateral ST segment depression somewhat worse than prior tracings, high-sensitivity troponin I levels were normal.  Chest x-ray without acute findings.  We have discussed diagnostic options and  after reviewing the risks and benefits, plan is to proceed with a diagnostic cardiac catheterization for clear definition of coronary anatomy and assessment for revascularization options.  She is in agreement to proceed.  Starting aspirin 81 mg daily, continue Toprol-XL and Lipitor.  2.  Essential hypertension by history.  She is currently on Toprol-XL.  3.  History of Crohn's colitis and GERD.  She follows with Dr. Laural Golden.  Currently on Protonix, Bentyl, and Pentasa.  Medication Adjustments/Labs and Tests Ordered: Current medicines are reviewed at length with the patient today.  Concerns regarding medicines are outlined above.   Tests Ordered: No orders of the defined types were placed in this encounter.   Medication Changes: Meds ordered this encounter  Medications  . aspirin EC 81 MG tablet    Sig: Take 1 tablet (81 mg  total) by mouth daily. Swallow whole.    Dispense:  90 tablet    Refill:  3    Disposition:  Follow up after procedure.  Signed, Satira Sark, MD, [facility] 01/29/2020 11:23 AM    [place] [facility] [place]. [address] [place], [place] [address] Phone: [phone]; Fax: [phone]

## 2020-01-28 NOTE — Discharge Instructions (Addendum)
We saw you in the ER for the chest pain/shortness of breath. All of our cardiac workup is normal, including labs, EKG and chest X-RAY are normal. We are not sure what is causing your discomfort, but we feel comfortable sending you home at this time. The workup in the ER is not complete, and you should follow up with your primary care doctor for further evaluation.  Please return to the ER if you have worsening chest pain, shortness of breath, pain radiating to your jaw, shoulder, or back, sweats or fainting.  Otherwise please follow-up with cardiology service tomorrow at 8:30 in the morning

## 2020-01-28 NOTE — ED Provider Notes (Signed)
[facility] EMERGENCY DEPARTMENT Provider Note   CSN: [id number] Arrival date & time: 01/28/20  1219     History Chief Complaint  Patient presents with  . Chest Pain    Annette Briggs is a 79 y.o. female.  HPI  HPI: A 79 year old patient with a history of hypertension and hypercholesterolemia presents for evaluation of chest pain. Initial onset of pain was less than one hour ago. The patient's chest pain is described as heaviness/pressure/tightness and is not worse with exertion. The patient's chest pain is middle- or left-sided, is not well-localized, is not sharp and does not radiate to the arms/jaw/neck. The patient does not complain of nausea and denies diaphoresis. The patient has no history of stroke, has no history of peripheral artery disease, has not smoked in the past 90 days, denies any history of treated diabetes, has no relevant family history of coronary artery disease (first degree relative at less than age 74) and does not have an elevated BMI (>=30).   Past Medical History:  Diagnosis Date  . Anxiety   . Crohn's colitis ([place])   . Depression   . Essential hypertension   . GERD (gastroesophageal reflux disease)     Patient Active Problem List   Diagnosis Date Noted  . Crohn's disease of large intestine without complication ([place]) 54/65/0354  . GERD (gastroesophageal reflux disease) 07/22/2013  . Essential hypertension, benign 04/21/2013  . Depression 04/21/2013  . Crohn disease ([place]) 02/05/2013    Past Surgical History:  Procedure Laterality Date  . APPENDECTOMY    . BLADDER SUSPENSION    . CHOLECYSTECTOMY    . COLONOSCOPY     2008  . COLONOSCOPY N/A 02/13/2013   Procedure: COLONOSCOPY;  Surgeon: [name], MD;  [facility];  Service: Endoscopy;  Laterality: N/A;  730  . COLONOSCOPY N/A 01/15/2019   Procedure: COLONOSCOPY;  Surgeon: [name], MD;  [facility];  Service: Endoscopy;  Laterality: N/A;  100  . TOTAL  ABDOMINAL HYSTERECTOMY       OB History   No obstetric history on file.     Family History  Problem Relation Age of Onset  . Heart failure Mother   . Kidney failure Father   . Breast cancer Sister   . Alzheimer's disease Sister     Social History   Tobacco Use  . Smoking status: Never Smoker  . Smokeless tobacco: Never Used  Vaping Use  . Vaping Use: Never used  Substance Use Topics  . Alcohol use: No    Alcohol/week: 0.0 standard drinks  . Drug use: No    Home Medications Prior to Admission medications   Medication Sig Start Date End Date Taking? Authorizing Provider  acetaminophen (TYLENOL) 500 MG tablet Take 1,000 mg by mouth every 6 (six) hours as needed for moderate pain or headache.    [provider]  atorvastatin (LIPITOR) 10 MG tablet Take 10 mg by mouth at bedtime.    [provider]  calcium carbonate (TUMS - DOSED IN MG ELEMENTAL CALCIUM) 500 MG chewable tablet Chew 1-2 tablets by mouth daily as needed for indigestion or heartburn.    [provider]  Calcium Carbonate-Vitamin D (CALCIUM 500 + D PO) Take 1 tablet by mouth daily.    [provider]  dicyclomine (BENTYL) 10 MG capsule Take 1 capsule (10 mg total) by mouth 2 (two) times daily as needed for spasms. 07/04/16   [name], MD  diphenhydrAMINE (  BENADRYL) 25 MG tablet Take 25 mg by mouth daily as needed for allergies.    [provider]  erythromycin ophthalmic ointment Place 1 application into the right eye daily as needed (stye).    [provider]  LORazepam (ATIVAN) 0.5 MG tablet Take 0.25-0.5 mg by mouth as needed for anxiety.     [provider]  mesalamine (PENTASA) 500 MG CR capsule Take 2 capsules (1,000 mg total) by mouth 3 (three) times daily. Patient taking differently: Take 1,000-1,500 mg by mouth See admin instructions. Take 1000 mg in the morning and 1500 mg at night 01/26/14   Rehman, Mechele Dawley, MD  metoprolol succinate  (TOPROL-XL) 25 MG 24 hr tablet Take 12.5 mg by mouth at bedtime.    [provider]  metroNIDAZOLE (FLAGYL) 500 MG tablet Take 1 tablet (500 mg total) by mouth 2 (two) times daily. 01/15/19   Rehman, Mechele Dawley, MD  Misc Natural Products (GLUCOSAMINE CHONDROITIN TRIPLE) TABS Take 1 tablet by mouth daily.    [provider]  Multiple Vitamin (MULTIVITAMIN) tablet Take 1 tablet by mouth daily.    [provider]  Multiple Vitamins-Minerals (AIRBORNE) CHEW Chew 1 tablet by mouth daily.    [provider]  OVER THE COUNTER MEDICATION Brain Shield with Konotex -Takes 1 by mouth daily.    [provider]  pantoprazole (PROTONIX) 40 MG tablet Take 40 mg by mouth at bedtime.     [provider]  sulfamethoxazole-trimethoprim (BACTRIM DS) 800-160 MG tablet Take 1 tablet by mouth 2 (two) times daily. 01/15/19   Rehman, Mechele Dawley, MD  tobramycin (TOBREX) 0.3 % ophthalmic solution Place 1 drop into the right eye 2 (two) times a day.    [provider]  venlafaxine XR (EFFEXOR-XR) 37.5 MG 24 hr capsule Take 37.5 mg by mouth daily.    [provider]    Allergies    Ciprofloxacin, Pneumovax [pneumococcal polysaccharide vaccine], and Wellbutrin [bupropion]  Review of Systems   Review of Systems  Constitutional: Positive for activity change.  Respiratory: Positive for chest tightness.   All other systems reviewed and are negative.   Physical Exam Updated Vital Signs BP (!) 157/82   Pulse 72   Temp 98.3 F (36.8 C) (Oral)   Resp 16   Ht 5' 1"  (1.549 m)   Wt 64.9 kg   SpO2 95%   BMI 27.02 kg/m   Physical Exam Vitals and nursing note reviewed.  Constitutional:      Appearance: She is well-developed.  HENT:     Head: Normocephalic and atraumatic.  Cardiovascular:     Rate and Rhythm: Normal rate.  Pulmonary:     Effort: Pulmonary effort is normal.  Abdominal:     General: Bowel sounds are normal.  Musculoskeletal:      Cervical back: Normal range of motion and neck supple.  Skin:    General: Skin is warm and dry.  Neurological:     Mental Status: She is alert and oriented to person, place, and time.     ED Results / Procedures / Treatments   Labs (all labs ordered are listed, but only abnormal results are displayed) Labs Reviewed  BASIC METABOLIC PANEL - Abnormal; Notable for the following components:      Result Value   Glucose, Bld 100 (*)    All other components within normal limits  CBC  TROPONIN I (HIGH SENSITIVITY)  TROPONIN I (HIGH SENSITIVITY)    EKG EKG Interpretation  Date/Time:  Wednesday January 28 2020 12:26:03 EDT Ventricular Rate:  85 PR Interval:  174 QRS Duration: 84 QT Interval:  348 QTC Calculation: 414 R Axis:   10 Text Interpretation: Normal sinus rhythm Nonspecific ST abnormality Abnormal ECG ST depression in inferior and lateral leads No old tracing to compare Confirmed by Varney Biles [phone]) on 01/28/2020 12:33:48 PM   Radiology DG Chest 2 View  Result Date: 01/28/2020 CLINICAL DATA:  Chest pain. EXAM: CHEST - 2 VIEW COMPARISON:  None. FINDINGS: The heart size and mediastinal contours are within normal limits. Both lungs are clear. No pneumothorax or pleural effusion is noted. The visualized skeletal structures are unremarkable. IMPRESSION: No active cardiopulmonary disease. Electronically Signed   By: Marijo Conception M.D.   On: 01/28/2020 13:29    Procedures Procedures (including critical care time)  Medications Ordered in ED Medications  sodium chloride flush (NS) 0.9 % injection 3 mL (has no administration in time range)  aspirin chewable tablet 324 mg (324 mg Oral Given 01/28/20 1606)    ED Course  I have reviewed the triage vital signs and the nursing notes.  Pertinent labs & imaging results that were available during my care of the patient were reviewed by me and considered in my medical decision making (see chart for details).    MDM  Rules/Calculators/A&P HEAR Score: 5                        Differential diagnosis includes: ACS syndrome Aortic dissection CHF exacerbation Valvular disorder Myocarditis Pericarditis Endocarditis Pericardial effusion / tamponade Pneumonia Pleural effusion / Pulmonary edema PE Pneumothorax Musculoskeletal pain PUD / Gastritis / Esophagitis Esophageal spasm  Patient comes in a chief complaint of chest pain.  Chest pain started while she was playing pickle ball.  She had 2 separate episodes, both resolved with rest.  The pain was nonradiating and central.  She did not have any associated nausea, diaphoresis, shortness of breath.  She does state that she has had chest pain in the past, but not this severe.  Allegedly she was having palpitations when she was evaluated by physician at the court. There is no history of A. Fib.  Her hear score is 5  Delta troponin is reassuring and below the institutional threshold.  It is possible that she might have had A. fib, SVT that might have triggered the chest pain.  It is possible that she might actually have stable angina.  Case discussed with cardiology.  They will see her tomorrow for a possible stress test.  Final Clinical Impression(s) / ED Diagnoses Final diagnoses:  Precordial chest pain    Rx / DC Orders ED Discharge Orders    None       Varney Biles, MD 01/28/20 1636

## 2020-01-29 ENCOUNTER — Encounter: Payer: Self-pay | Admitting: Cardiology

## 2020-01-29 ENCOUNTER — Ambulatory Visit: Payer: Medicare HMO | Admitting: Cardiology

## 2020-01-29 ENCOUNTER — Other Ambulatory Visit: Payer: Self-pay | Admitting: Cardiology

## 2020-01-29 VITALS — BP 126/68 | HR 82 | Ht 61.5 in | Wt 144.0 lb

## 2020-01-29 DIAGNOSIS — E782 Mixed hyperlipidemia: Secondary | ICD-10-CM

## 2020-01-29 DIAGNOSIS — R079 Chest pain, unspecified: Secondary | ICD-10-CM

## 2020-01-29 DIAGNOSIS — I200 Unstable angina: Secondary | ICD-10-CM

## 2020-01-29 DIAGNOSIS — I10 Essential (primary) hypertension: Secondary | ICD-10-CM

## 2020-01-29 MED ORDER — ASPIRIN EC 81 MG PO TBEC: 81.0000 mg | DELAYED_RELEASE_TABLET | Freq: Every day | ORAL | 3 refills | Status: DC

## 2020-01-29 MED ORDER — SODIUM CHLORIDE 0.9% FLUSH: 3.0000 mL | Freq: Two times a day (BID) | INTRAVENOUS | Status: DC

## 2020-01-29 NOTE — Patient Instructions (Signed)
Medication Instructions:  Your physician recommends that you continue on your current medications as directed. Please refer to the Current Medication list given to you today.  Start Aspirin 81 mg Daily   *If you need a refill on your cardiac medications before your next appointment, please call your pharmacy*   Lab Work: NONE   If you have labs (blood work) drawn today and your tests are completely normal, you will receive your results only by: Marland Kitchen MyChart Message (if you have MyChart) OR . A paper copy in the mail If you have any lab test that is abnormal or we need to change your treatment, we will call you to review the results.   Testing/Procedures: Your physician has requested that you have a cardiac catheterization. Cardiac catheterization is used to diagnose and/or treat various heart conditions. Doctors may recommend this procedure for a number of different reasons. The most common reason is to evaluate chest pain. Chest pain can be a symptom of coronary artery disease (CAD), and cardiac catheterization can show whether plaque is narrowing or blocking your heart's arteries. This procedure is also used to evaluate the valves, as well as measure the blood flow and oxygen levels in different parts of your heart. For further information please visit [email or web address].[email or web address]. Please follow instruction sheet, as given.   Follow-Up: At [facility], you and your health needs are our priority.  As part of our continuing mission to provide you with exceptional heart care, we have created designated Provider Care Teams.  These Care Teams include your primary Cardiologist (physician) and Advanced Practice Providers (APPs -  Physician Assistants and Nurse Practitioners) who all work together to provide you with the care you need, when you need it.  We recommend signing up for the patient portal called "MyChart".  Sign up information is provided on this After Visit Summary.  MyChart is used to connect  with patients for Virtual Visits (Telemedicine).  Patients are able to view lab/test results, encounter notes, upcoming appointments, etc.  Non-urgent messages can be sent to your provider as well.   To learn more about what you can do with MyChart, go to [email or web address].    Your next appointment:   2 week(s)  The format for your next appointment:   In Person  Provider:   Rozann Lesches, MD   Other Instructions Thank you for choosing Annet[facility]gs!     [place] [address] Dept: [phone] Loc: [place]  01/29/2020  You are scheduled for a Cardiac Catheterization on Monday, August 2 with Dr. Sherren Mocha.  1. Please arrive at the [facility] (Main Entrance A) at [facility]: [address] [place], [place] [address] at 5:30 AM (This time is two hours before your procedure to ensure your preparation). Free valet parking service is available.   Special note: Every effort is made to have your procedure done on time. Please understand that emergencies sometimes delay scheduled procedures.  2. Diet: Do not eat solid foods after midnight.  The patient may have clear liquids until 5am upon the day of the procedure.  3. Labs: Already completed   4. Medication instructions in preparation for your procedure:   Contrast Allergy: No  On the morning of your procedure, take your Aspirin and any morning medicines NOT listed above.  You may use sips of water.  5. Plan for one night stay--bring personal belongings.  6. Bring a current list of your medications and current insurance cards. 7. You MUST have a responsible person to drive you home. 8. Someone MUST be with you the first 24 hours after you arrive home or your discharge will be delayed. 9. Please wear clothes that are easy to get on and off and wear slip-on shoes.  Thank you for  allowing [name] to care for you!   -- [facility]

## 2020-02-02 ENCOUNTER — Encounter (HOSPITAL_COMMUNITY)
Admission: RE | Disposition: A | Discharge status: Home / Self Care | Payer: Self-pay | Source: Home / Self Care | Attending: Cardiovascular Disease

## 2020-02-02 ENCOUNTER — Ambulatory Visit (HOSPITAL_COMMUNITY)
Admission: RE | Admit: 2020-02-02 | Discharge: 2020-02-02 | Disposition: A | Discharge status: Home / Self Care | Payer: Medicare HMO | Attending: Cardiovascular Disease | Admitting: Cardiovascular Disease

## 2020-02-02 ENCOUNTER — Encounter (HOSPITAL_COMMUNITY): Payer: Self-pay | Admitting: Cardiovascular Disease

## 2020-02-02 ENCOUNTER — Other Ambulatory Visit: Payer: Self-pay

## 2020-02-02 ENCOUNTER — Ambulatory Visit (HOSPITAL_COMMUNITY): Payer: Medicare HMO

## 2020-02-02 DIAGNOSIS — K219 Gastro-esophageal reflux disease without esophagitis: Secondary | ICD-10-CM | POA: Insufficient documentation

## 2020-02-02 DIAGNOSIS — F419 Anxiety disorder, unspecified: Secondary | ICD-10-CM | POA: Insufficient documentation

## 2020-02-02 DIAGNOSIS — Z7982 Long term (current) use of aspirin: Secondary | ICD-10-CM | POA: Diagnosis not present

## 2020-02-02 DIAGNOSIS — I200 Unstable angina: Secondary | ICD-10-CM

## 2020-02-02 DIAGNOSIS — I25119 Atherosclerotic heart disease of native coronary artery with unspecified angina pectoris: Secondary | ICD-10-CM | POA: Diagnosis not present

## 2020-02-02 DIAGNOSIS — Z881 Allergy status to other antibiotic agents status: Secondary | ICD-10-CM | POA: Diagnosis not present

## 2020-02-02 DIAGNOSIS — Z79899 Other long term (current) drug therapy: Secondary | ICD-10-CM | POA: Diagnosis not present

## 2020-02-02 DIAGNOSIS — I10 Essential (primary) hypertension: Secondary | ICD-10-CM | POA: Diagnosis not present

## 2020-02-02 DIAGNOSIS — I209 Angina pectoris, unspecified: Secondary | ICD-10-CM | POA: Diagnosis present

## 2020-02-02 DIAGNOSIS — K5010 Crohn's disease of large intestine without complications: Secondary | ICD-10-CM | POA: Diagnosis not present

## 2020-02-02 DIAGNOSIS — F329 Major depressive disorder, single episode, unspecified: Secondary | ICD-10-CM | POA: Diagnosis not present

## 2020-02-02 HISTORY — PX: LEFT HEART CATH AND CORONARY ANGIOGRAPHY: CATH118249

## 2020-02-02 SURGERY — LEFT HEART CATH AND CORONARY ANGIOGRAPHY
Anesthesia: LOCAL

## 2020-02-02 MED ORDER — HYDRALAZINE HCL 20 MG/ML IJ SOLN: 10.0000 mg | INTRAMUSCULAR | Status: DC | PRN

## 2020-02-02 MED ORDER — LIDOCAINE HCL (PF) 1 % IJ SOLN
INTRAMUSCULAR | Status: AC
  Filled 2020-02-02: qty 30

## 2020-02-02 MED ORDER — HEPARIN (PORCINE) IN NACL 1000-0.9 UT/500ML-% IV SOLN
INTRAVENOUS | Status: AC
  Filled 2020-02-02: qty 1000

## 2020-02-02 MED ORDER — HEPARIN (PORCINE) IN NACL 1000-0.9 UT/500ML-% IV SOLN
INTRAVENOUS | Status: DC | PRN
  Administered 2020-02-02 (×2): 500 mL

## 2020-02-02 MED ORDER — SODIUM CHLORIDE 0.9 % WEIGHT BASED INFUSION
3.0000 mL/kg/h | INTRAVENOUS | Status: AC
  Administered 2020-02-02: 3 mL/kg/h via INTRAVENOUS

## 2020-02-02 MED ORDER — MIDAZOLAM HCL 2 MG/2ML IJ SOLN
INTRAMUSCULAR | Status: DC | PRN
  Administered 2020-02-02: 2 mg via INTRAVENOUS

## 2020-02-02 MED ORDER — ONDANSETRON HCL 4 MG/2ML IJ SOLN: 4.0000 mg | Freq: Four times a day (QID) | INTRAMUSCULAR | Status: DC | PRN

## 2020-02-02 MED ORDER — SODIUM CHLORIDE 0.9% FLUSH: 3.0000 mL | Freq: Two times a day (BID) | INTRAVENOUS | Status: DC

## 2020-02-02 MED ORDER — HEPARIN SODIUM (PORCINE) 1000 UNIT/ML IJ SOLN
INTRAMUSCULAR | Status: DC | PRN
  Administered 2020-02-02: 4000 [IU] via INTRAVENOUS

## 2020-02-02 MED ORDER — IOHEXOL 350 MG/ML SOLN
INTRAVENOUS | Status: DC | PRN
  Administered 2020-02-02: 50 mL via INTRA_ARTERIAL

## 2020-02-02 MED ORDER — VERAPAMIL HCL 2.5 MG/ML IV SOLN
INTRAVENOUS | Status: AC
  Filled 2020-02-02: qty 2

## 2020-02-02 MED ORDER — SODIUM CHLORIDE 0.9% FLUSH: 3.0000 mL | INTRAVENOUS | Status: DC | PRN

## 2020-02-02 MED ORDER — FENTANYL CITRATE (PF) 100 MCG/2ML IJ SOLN
INTRAMUSCULAR | Status: DC | PRN
  Administered 2020-02-02: 25 ug via INTRAVENOUS

## 2020-02-02 MED ORDER — MIDAZOLAM HCL 2 MG/2ML IJ SOLN
INTRAMUSCULAR | Status: AC
  Filled 2020-02-02: qty 2

## 2020-02-02 MED ORDER — SODIUM CHLORIDE 0.9 % IV SOLN: 250.0000 mL | INTRAVENOUS | Status: DC | PRN

## 2020-02-02 MED ORDER — FENTANYL CITRATE (PF) 100 MCG/2ML IJ SOLN
INTRAMUSCULAR | Status: AC
  Filled 2020-02-02: qty 2

## 2020-02-02 MED ORDER — ASPIRIN 81 MG PO CHEW: 81.0000 mg | CHEWABLE_TABLET | ORAL | Status: DC

## 2020-02-02 MED ORDER — SODIUM CHLORIDE 0.9 % WEIGHT BASED INFUSION: 1.0000 mL/kg/h | INTRAVENOUS | Status: DC

## 2020-02-02 MED ORDER — VERAPAMIL HCL 2.5 MG/ML IV SOLN
INTRAVENOUS | Status: DC | PRN
  Administered 2020-02-02: 10 mL via INTRA_ARTERIAL

## 2020-02-02 MED ORDER — LABETALOL HCL 5 MG/ML IV SOLN: 10.0000 mg | INTRAVENOUS | Status: DC | PRN

## 2020-02-02 MED ORDER — HEPARIN SODIUM (PORCINE) 1000 UNIT/ML IJ SOLN
INTRAMUSCULAR | Status: AC
  Filled 2020-02-02: qty 1

## 2020-02-02 MED ORDER — ACETAMINOPHEN 325 MG PO TABS: 650.0000 mg | ORAL_TABLET | ORAL | Status: DC | PRN

## 2020-02-02 MED ORDER — LIDOCAINE HCL (PF) 1 % IJ SOLN
INTRAMUSCULAR | Status: DC | PRN
  Administered 2020-02-02: 2 mL

## 2020-02-02 SURGICAL SUPPLY — 9 items
CATH 5FR JL3.5 JR4 ANG PIG MP (CATHETERS) ×2 IMPLANT
DEVICE RAD COMP TR BAND LRG (VASCULAR PRODUCTS) ×2 IMPLANT
GLIDESHEATH SLEND SS 6F .021 (SHEATH) ×2 IMPLANT
GUIDEWIRE INQWIRE 1.5J.035X260 (WIRE) ×1 IMPLANT
INQWIRE 1.5J .035X260CM (WIRE) ×2
KIT HEART LEFT (KITS) ×2 IMPLANT
PACK CARDIAC CATHETERIZATION (CUSTOM PROCEDURE TRAY) ×2 IMPLANT
TRANSDUCER W/STOPCOCK (MISCELLANEOUS) ×2 IMPLANT
TUBING CIL FLEX 10 FLL-RA (TUBING) ×2 IMPLANT

## 2020-02-02 NOTE — Discharge Instructions (Signed)
Radial Site Care  This sheet gives you information about how to care for yourself after your procedure. Your health care provider may also give you more specific instructions. If you have problems or questions, contact your health care provider. What can I expect after the procedure? After the procedure, it is common to have:  Bruising and tenderness at the catheter insertion area. Follow these instructions at home: Medicines  Take over-the-counter and prescription medicines only as told by your health care provider. Insertion site care  Follow instructions from your health care provider about how to take care of your insertion site. Make sure you: ? Wash your hands with soap and water before you change your bandage (dressing). If soap and water are not available, use hand sanitizer. ? Change your dressing as told by your health care provider. ? Leave stitches (sutures), skin glue, or adhesive strips in place. These skin closures may need to stay in place for 2 weeks or longer. If adhesive strip edges start to loosen and curl up, you may trim the loose edges. Do not remove adhesive strips completely unless your health care provider tells you to do that.  Check your insertion site every day for signs of infection. Check for: ? Redness, swelling, or pain. ? Fluid or blood. ? Pus or a bad smell. ? Warmth.  Do not take baths, swim, or use a hot tub until your health care provider approves.  You may shower 24-48 hours after the procedure, or as directed by your health care provider. ? Remove the dressing and gently wash the site with plain soap and water. ? Pat the area dry with a clean towel. ? Do not rub the site. That could cause bleeding.  Do not apply powder or lotion to the site. Activity   For 24 hours after the procedure, or as directed by your health care provider: ? Do not flex or bend the affected arm. ? Do not push or pull heavy objects with the affected arm. ? Do not  drive yourself home from the hospital or clinic. You may drive 24 hours after the procedure unless your health care provider tells you not to. ? Do not operate machinery or power tools.  Do not lift anything that is heavier than 10 lb (4.5 kg), or the limit that you are told, until your health care provider says that it is safe.  Ask your health care provider when it is okay to: ? Return to work or school. ? Resume usual physical activities or sports. ? Resume sexual activity. General instructions  If the catheter site starts to bleed, raise your arm and put firm pressure on the site. If the bleeding does not stop, get help right away. This is a medical emergency.  If you went home on the same day as your procedure, a responsible adult should be with you for the first 24 hours after you arrive home.  Keep all follow-up visits as told by your health care provider. This is important. Contact a health care provider if:  You have a fever.  You have redness, swelling, or yellow drainage around your insertion site. Get help right away if:  You have unusual pain at the radial site.  The catheter insertion area swells very fast.  The insertion area is bleeding, and the bleeding does not stop when you hold steady pressure on the area.  Your arm or hand becomes pale, cool, tingly, or numb. These symptoms may represent a serious problem   that is an emergency. Do not wait to see if the symptoms will go away. Get medical help right away. Call your local emergency services (911 in the [place] Do not drive yourself to the hospital. Summary  After the procedure, it is common to have bruising and tenderness at the site.  Follow instructions from your health care provider about how to take care of your radial site wound. Check the wound every day for signs of infection.  Do not lift anything that is heavier than 10 lb (4.5 kg), or the limit that you are told, until your health care provider says  that it is safe. This information is not intended to replace advice given to you by your health care provider. Make sure you discuss any questions you have with your health care provider. Document Revised: 07/25/2017 Document Reviewed: 07/25/2017 Elsevier Patient Education  2020 Elsevier Inc[employer]  

## 2020-02-02 NOTE — Interval H&P Note (Signed)
Cath Lab Visit (complete for each Cath Lab visit)  Clinical Evaluation Leading to the Procedure:   ACS: No.  Non-ACS:    Anginal Classification: CCS III  Anti-ischemic medical therapy: Minimal Therapy (1 class of medications)  Non-Invasive Test Results: No non-invasive testing performed  Prior CABG: No previous CABG      History and Physical Interval Note:  02/02/2020 7:37 AM  Annette Briggs  has presented today for surgery, with the diagnosis of angina.  The various methods of treatment have been discussed with the patient and family. After consideration of risks, benefits and other options for treatment, the patient has consented to  Procedure(s): LEFT HEART CATH AND CORONARY ANGIOGRAPHY (N/A) as a surgical intervention.  The patient's history has been reviewed, patient examined, no change in status, stable for surgery.  I have reviewed the patient's chart and labs.  Questions were answered to the patient's satisfaction.     Sherren Mocha

## 2020-02-06 ENCOUNTER — Inpatient Hospital Stay (HOSPITAL_COMMUNITY): Admission: RE | Admit: 2020-02-06 | Payer: Medicare HMO | Source: Ambulatory Visit

## 2020-02-09 ENCOUNTER — Other Ambulatory Visit: Payer: Self-pay

## 2020-02-09 ENCOUNTER — Encounter (INDEPENDENT_AMBULATORY_CARE_PROVIDER_SITE_OTHER): Payer: Self-pay | Admitting: Gastroenterology

## 2020-02-09 ENCOUNTER — Ambulatory Visit (INDEPENDENT_AMBULATORY_CARE_PROVIDER_SITE_OTHER): Payer: Medicare HMO | Admitting: Gastroenterology

## 2020-02-09 ENCOUNTER — Encounter (INDEPENDENT_AMBULATORY_CARE_PROVIDER_SITE_OTHER): Payer: Self-pay | Admitting: *Deleted

## 2020-02-09 VITALS — BP 150/85 | HR 89 | Temp 98.4°F | Ht 61.5 in | Wt 144.4 lb

## 2020-02-09 DIAGNOSIS — R0789 Other chest pain: Secondary | ICD-10-CM | POA: Diagnosis not present

## 2020-02-09 DIAGNOSIS — K5010 Crohn's disease of large intestine without complications: Secondary | ICD-10-CM | POA: Diagnosis not present

## 2020-02-09 DIAGNOSIS — K219 Gastro-esophageal reflux disease without esophagitis: Secondary | ICD-10-CM | POA: Diagnosis not present

## 2020-02-09 NOTE — Patient Instructions (Signed)
We are scheduling endoscopy for evaluation.

## 2020-02-09 NOTE — Progress Notes (Signed)
Patient profile: Annette Briggs is a 79 y.o. female seen for follow-up of Crohn's disease with new onset chest pain over past 1-2 weeks.  History of Present Illness: Annette Briggs is seen today for follow up. Reports last week playing pickleball and developed chest pain (this was not uncommon for her but usually resolved with resting) this specific episode did not resolve with resting and she was evaluated in the ER. She had a cardiac catheterization as well.  Describes the pain as a pressure in her mid chest, prior pickleball episodes seemed to be worse if she is walking fast or exercising.  Does not feel relates to eating.  Over the past week she has not had return of the pain until she played pickle ball a few minutes this morning.  This morning it did resolve with rest.  She denies classic heartburn symptoms.  Currently takes her Protonix in the evening.  If she eats a spicy food she will take Tums.  Denies any nausea vomiting or dysphagia.   She reports that there are 2-3 bowel movements per day.  If she has more than 3 stools a day she will take dicyclomine which resolves her symptoms.  Denies any rectal bleeding or abdominal pain.  Non-smoker, no frequent alcohol.  Denies NSAID use   Wt Readings from Last 3 Encounters:  02/09/20 144 lb 6.4 oz (65.5 kg)  02/02/20 143 lb (64.9 kg)  01/29/20 144 lb (65.3 kg)     Last Colonoscopy: 01/2019-Diverticulosis in the sigmoid colon.  - Mild diverticulosis in the distal sigmoid colon.                            Purulent discharge was seen in association with the diverticular opening, indicative of diverticulitis.                           - No specimens collected. Last Endoscopy: around 2006- per patient had endoscopy in [place] showing [facility]   Past Medical History:  Past Medical History:  Diagnosis Date  . Anxiety   . Crohn's colitis ([place])   . Depression   . Essential hypertension   . GERD (gastroesophageal reflux disease)     Problem  List: Patient Active Problem List   Diagnosis Date Noted  . Angina pectoris ([place]) 02/02/2020  . Crohn's disease of large intestine without complication ([place]) 00/92/3300  . GERD (gastroesophageal reflux disease) 07/22/2013  . Essential hypertension, benign 04/21/2013  . Depression 04/21/2013  . Crohn disease ([place]) 02/05/2013    Past Surgical History: Past Surgical History:  Procedure Laterality Date  . APPENDECTOMY    . BLADDER SUSPENSION    . CHOLECYSTECTOMY    . COLONOSCOPY     2008  . COLONOSCOPY N/A 02/13/2013   Procedure: COLONOSCOPY;  Surgeon: [name], MD;  Location: [facility];  Service: Endoscopy;  Laterality: N/A;  730  . COLONOSCOPY N/A 01/15/2019   Procedure: COLONOSCOPY;  Surgeon: [name], MD;  Location: [facility];  Service: Endoscopy;  Laterality: N/A;  100  . LEFT HEART CATH AND CORONARY ANGIOGRAPHY N/A 02/02/2020   Procedure: LEFT HEART CATH AND CORONARY ANGIOGRAPHY;  Surgeon: Sherren Mocha, MD;  Location: [facility];  Service: Cardiovascular;  Laterality: N/A;  . TOTAL ABDOMINAL HYSTERECTOMY      Allergies: Allergies  Allergen Reactions  . Ciprofloxacin Other (See Comments)  Patient can't remember reaction.  . Pneumovax [Pneumococcal Polysaccharide Vaccine] Swelling  . Wellbutrin [Bupropion] Anxiety    Anxiety attack      Home Medications:  Current Outpatient Medications:  .  acetaminophen (TYLENOL) 500 MG tablet, Take 500-1,000 mg by mouth every 6 (six) hours as needed for moderate pain or headache. , Disp: , Rfl:  .  atorvastatin (LIPITOR) 10 MG tablet, Take 10 mg by mouth at bedtime., Disp: , Rfl:  .  Boswellia-Glucosamine-Vit D (OSTEO BI-FLEX ONE PER DAY) TABS, Take 0.5 tablets by mouth daily., Disp: , Rfl:  .  calcium carbonate (TUMS - DOSED IN MG ELEMENTAL CALCIUM) 500 MG chewable tablet, Chew 1 tablet by mouth at bedtime as needed for indigestion or heartburn. , Disp: , Rfl:  .  Calcium Carbonate-Vitamin D (CALCIUM  600+D PO), Take 1 tablet by mouth 3 (three) times a week., Disp: , Rfl:  .  Cholecalciferol (DIALYVITE VITAMIN D 5000) 125 MCG (5000 UT) capsule, Take 5,000 Units by mouth daily., Disp: , Rfl:  .  diclofenac Sodium (VOLTAREN) 1 % GEL, Apply 1 application topically 4 (four) times daily as needed (knee pain)., Disp: , Rfl:  .  dicyclomine (BENTYL) 20 MG tablet, Take 20 mg by mouth 3 (three) times daily as needed for spasms., Disp: , Rfl:  .  diphenhydrAMINE (BENADRYL) 25 MG tablet, Take 25 mg by mouth daily as needed for allergies., Disp: , Rfl:  .  ibuprofen (ADVIL) 200 MG tablet, Take 200-400 mg by mouth every 6 (six) hours as needed for moderate pain., Disp: , Rfl:  .  metoprolol succinate (TOPROL-XL) 25 MG 24 hr tablet, Take 12.5 mg by mouth at bedtime., Disp: , Rfl:  .  Multiple Vitamin (MULTIVITAMIN) tablet, Take 1 tablet by mouth daily., Disp: , Rfl:  .  Multiple Vitamins-Minerals (A[place]NE) CHEW, Chew 1 tablet by mouth daily., Disp: , Rfl:  .  OVER THE COUNTER MEDICATION, Take 1 tablet by mouth daily. cognitex elite otc supplement, Disp: , Rfl:  .  pantoprazole (PROTONIX) 40 MG tablet, Take 40 mg by mouth at bedtime. , Disp: , Rfl:  .  venlafaxine XR (EFFEXOR-XR) 37.5 MG 24 hr capsule, Take 37.5 mg by mouth daily., Disp: , Rfl:  .  zinc gluconate 50 MG tablet, Take 50 mg by mouth 3 (three) times a week., Disp: , Rfl:  .  bismuth subsalicylate (PEPTO BISMOL) 262 MG chewable tablet, Chew 262 mg by mouth as needed for indigestion. (Patient not taking: Reported on 02/09/2020), Disp: , Rfl:  .  LORazepam (ATIVAN) 0.5 MG tablet, Take 0.5 mg by mouth daily as needed for anxiety. (Patient not taking: Reported on 02/09/2020), Disp: , Rfl:  .  mesalamine (PENTASA) 500 MG CR capsule, Take 2 capsules (1,000 mg total) by mouth 3 (three) times daily. (Patient not taking: Reported on 02/09/2020), Disp: 180 capsule, Rfl: 6  Current Facility-Administered Medications:  .  sodium chloride flush (NS) 0.9 % injection 3  mL, 3 mL, Intravenous, Q12H, Satira Sark, MD   Family History: family history includes Alzheimer's disease in her sister; Breast cancer in her sister; Heart failure in her mother; Kidney failure in her father.    Social History:   reports that she has never smoked. She has never used smokeless tobacco. She reports that she does not drink alcohol and does not use drugs.   Review of Systems: Constitutional: Denies weight loss/weight gain  Eyes: No changes in vision. ENT: No oral lesions, sore throat.  GI: see  HPI.  Heme/Lymph: No easy bruising.  CV: No chest pain.  GU: No hematuria.  Integumentary: No rashes.  Neuro: No headaches.  Psych: No depression/anxiety.  Endocrine: No heat/cold intolerance.  Allergic/Immunologic: No urticaria.  Resp: No cough, SOB.  Musculoskeletal: No joint swelling.    Physical Examination: BP (!) 150/85 (BP Location: Right Arm, Patient Position: Sitting, Cuff Size: Normal)   Pulse 89   Temp 98.4 F (36.9 C) (Oral)   Ht 5' 1.5" (1.562 m)   Wt 144 lb 6.4 oz (65.5 kg)   BMI 26.84 kg/m  Gen: NAD, alert and oriented x 4 HEENT: PEERLA, EOMI, Neck: supple, no JVD Chest: CTA bilaterally, no wheezes, crackles, or other adventitious sounds CV: RRR, no m/g/c/r Abd: soft, NT, ND, +BS in all four quadrants; no HSM, guarding, ridigity, or rebound tenderness Ext: no edema, well perfused with 2+ pulses, Skin: no rash or lesions noted on observed skin Lymph: no noted LAD  Data Reviewed:  ER notes January 28, 2020 reviewed  01/28/20-CBC and BMP normal   Cardiac cath February 02, 2020 1.  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  Normal LV function with normal LVEDP, LVEF estimated at 55%  Assessment/Plan: Ms. Brahmbhatt is a 79 y.o. female seen for follow-up.    1.  Crohn's-symptomatically in remission on Pentasa.  Colonoscopy last year with diverticulitis  but no findings of Crohn's.  We will continue Pentasa. To notify me if develops symptoms.   2.  Atypical chest pain-reports this as her biggest issue today.  She has had a cardiac cath as above.  Pain seems to worsen with exertion and not related to meals.  She does recall similar pain when she lived in [place] many years ago and was ultimately diagnosed with a hiatal hernia.  She would like to have an upper endoscopy for evaluation.  We discussed trial of increasing her PPI to twice a day but she would like to continue once a day dosing.  Diagnoses and all orders for this visit:  Atypical chest pain  Gastroesophageal reflux disease, unspecified whether esophagitis present  Crohn's disease of large intestine without complication ([place])    Patient denies CP, SOB, and use of blood thinners. I discussed the risks and benefits of procedure including bleeding, perforation, infection, missed lesions, medication reactions and possible hospitalization or surgery if complications. All questions answered. Denies prior issues w/ sedation.    I personally performed the service, non-incident to. (WP)  Laurine Blazer, [facility]

## 2020-02-25 ENCOUNTER — Ambulatory Visit (HOSPITAL_COMMUNITY): Payer: Medicare HMO

## 2020-02-27 ENCOUNTER — Ambulatory Visit: Payer: Medicare HMO | Admitting: Cardiology

## 2020-03-01 ENCOUNTER — Ambulatory Visit (HOSPITAL_COMMUNITY)
Admission: RE | Admit: 2020-03-01 | Discharge: 2020-03-01 | Disposition: A | Discharge status: Home / Self Care | Payer: Medicare HMO | Source: Ambulatory Visit | Attending: Internal Medicine | Admitting: Internal Medicine

## 2020-03-01 ENCOUNTER — Other Ambulatory Visit: Payer: Self-pay

## 2020-03-01 DIAGNOSIS — Z1231 Encounter for screening mammogram for malignant neoplasm of breast: Secondary | ICD-10-CM | POA: Insufficient documentation

## 2020-03-01 IMAGING — MG DIGITAL SCREENING BILAT W/ TOMO W/ CAD
8 series · 9 of 24 positions shown · non-contrast
Comparison: Previous exam(s).

CLINICAL DATA: Screening.

EXAM:
DIGITAL SCREENING BILATERAL MAMMOGRAM WITH TOMO AND CAD

[L CC synth-2D]
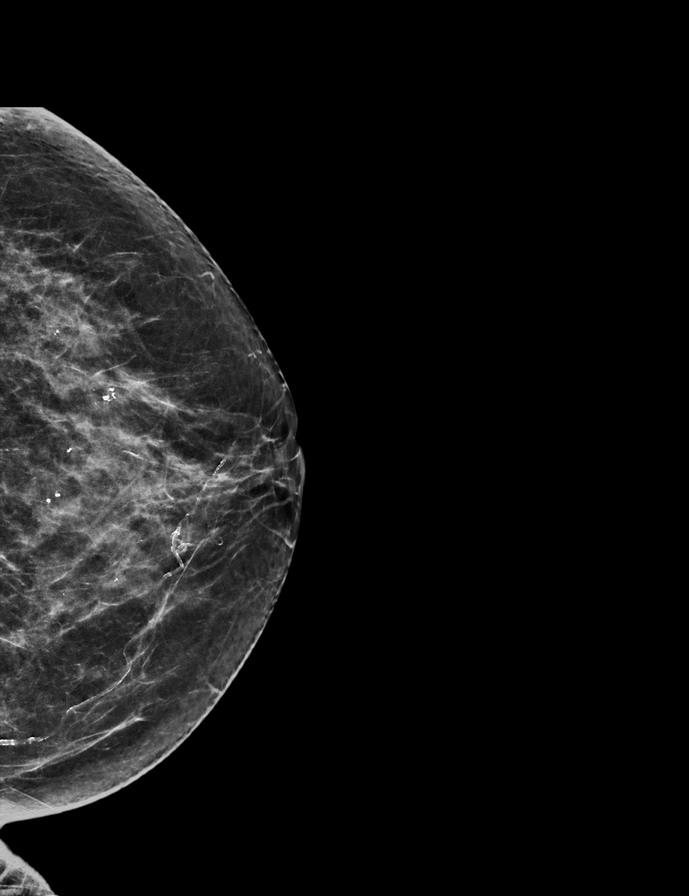

[R MLO synth-2D]
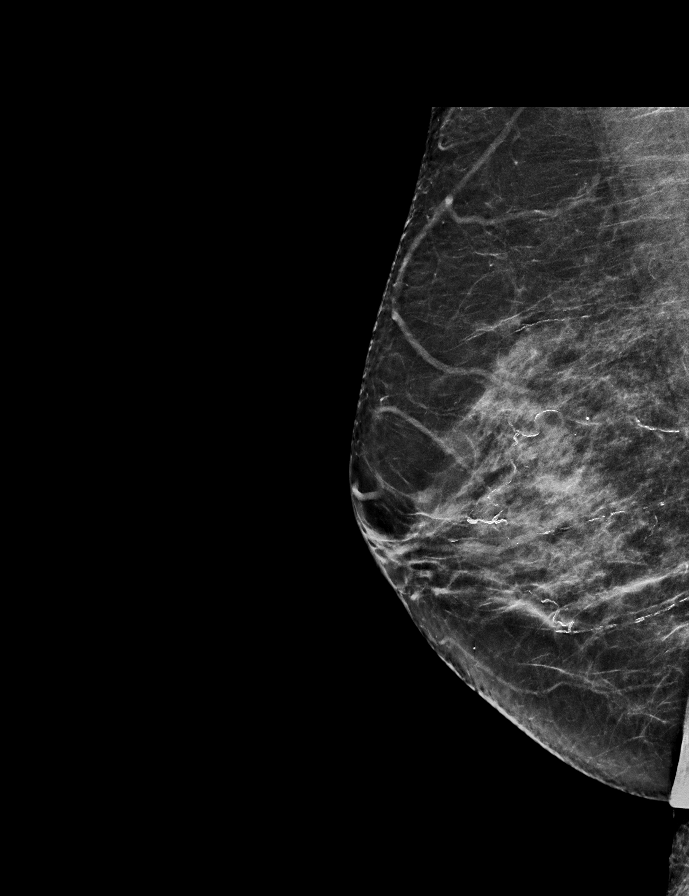

[R CC synth-2D]
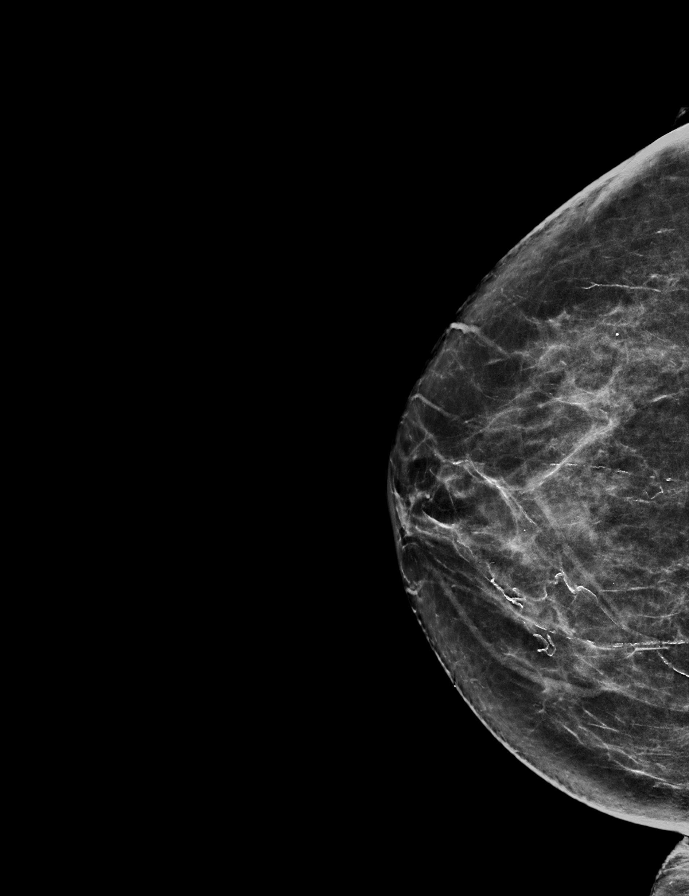

[L MLO synth-2D]
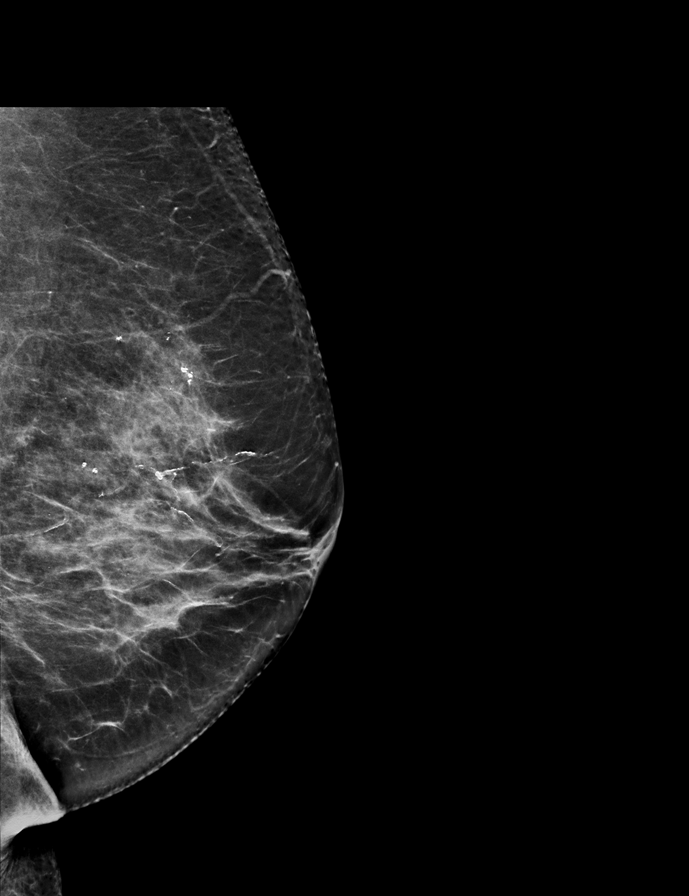

[R MLO tomo · 2 of 60 frames shown]
[frame 20/60]
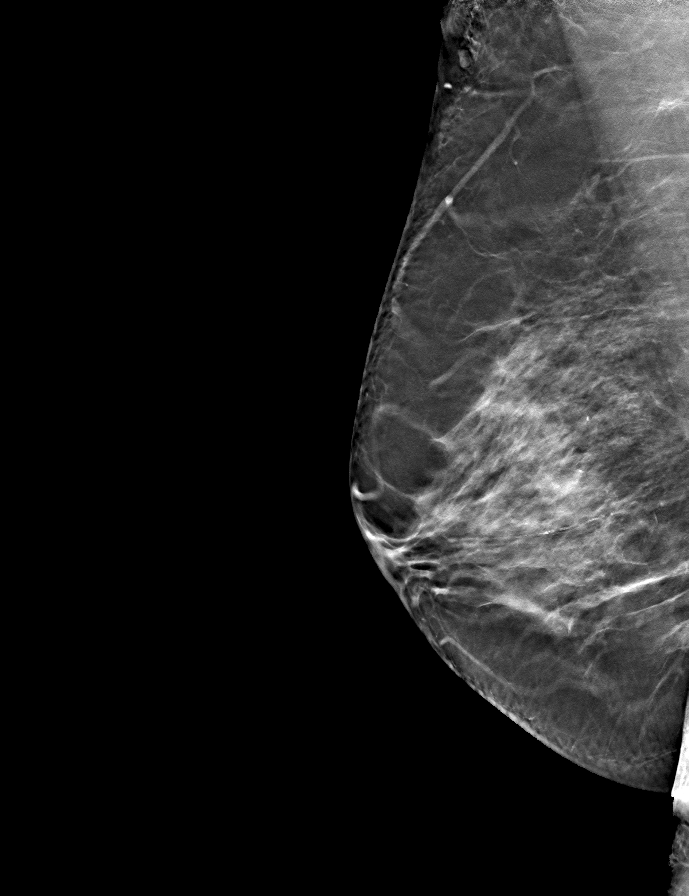
[frame 31/60]
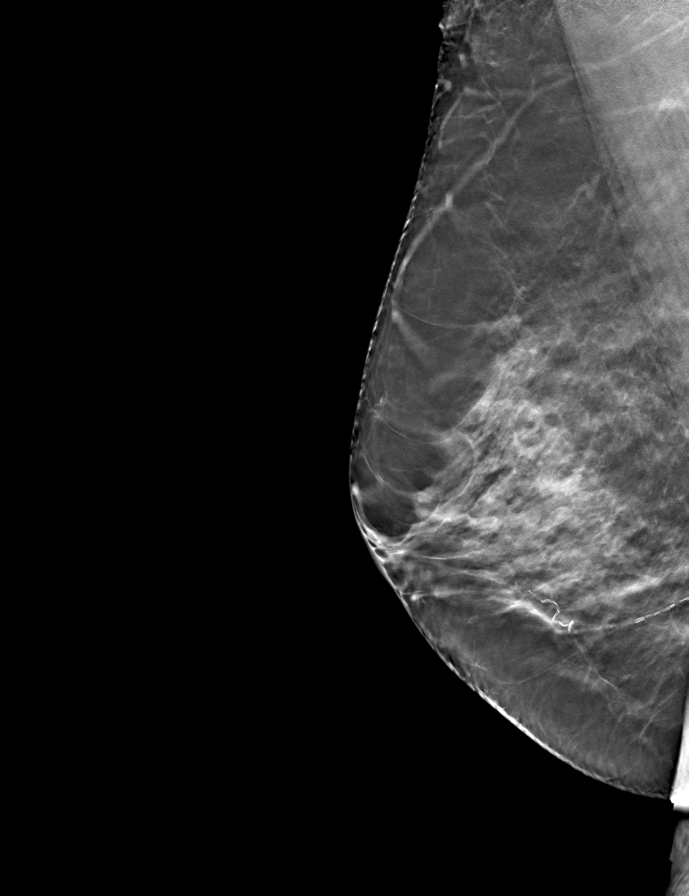

[L MLO tomo · tomo slice 33/66.0]
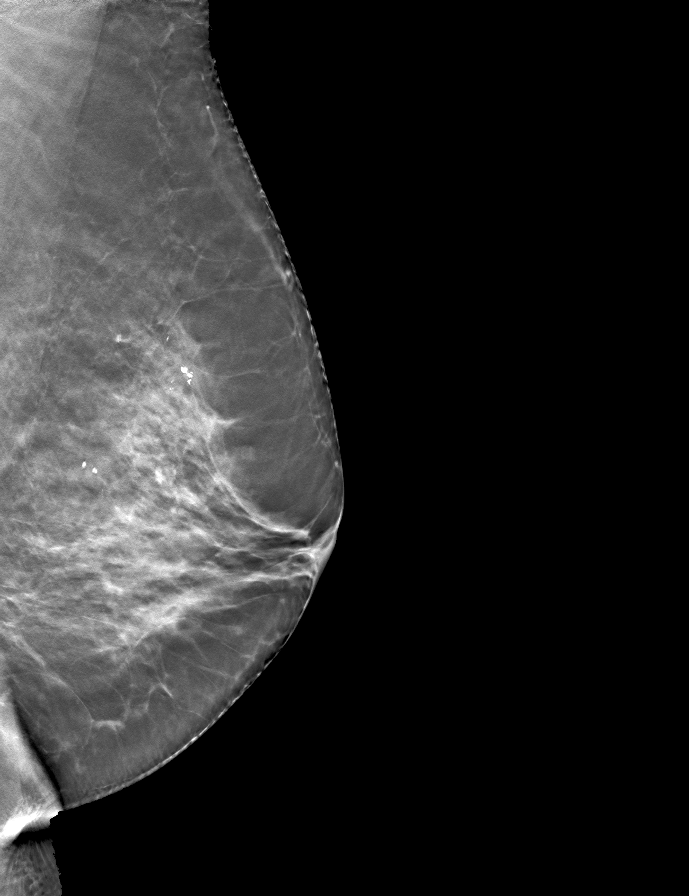

[L CC tomo · tomo slice 31/60.0]
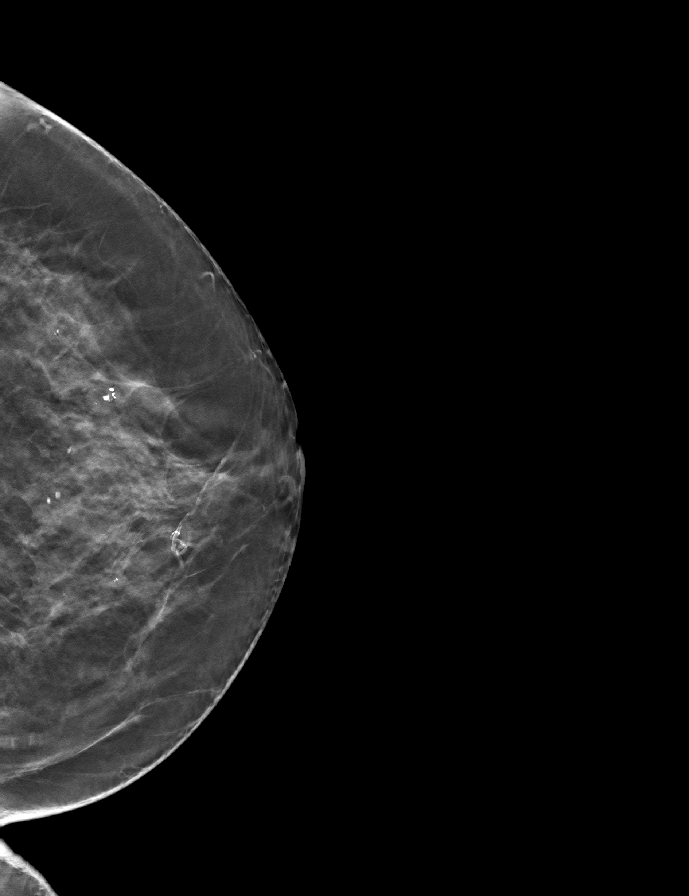

[R CC tomo · tomo slice 31/62.0]
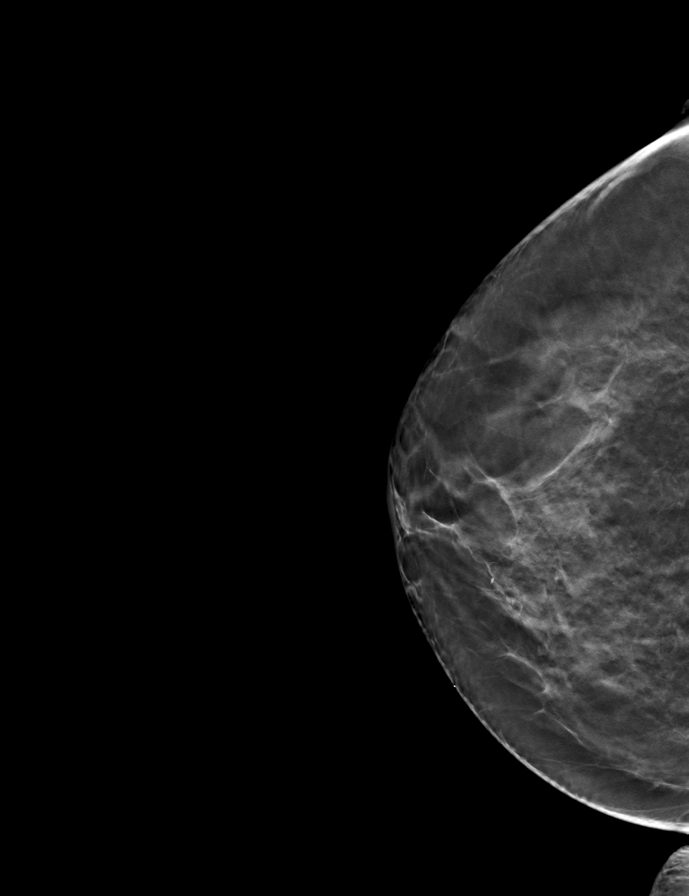

[9 of 24 positions shown; findings below may reference images not displayed]

ACR Breast Density Category c: The breast tissue is heterogeneously
dense, which may obscure small masses.
FINDINGS: There are no findings suspicious for malignancy. Images were
processed with CAD.
IMPRESSION: No mammographic evidence of malignancy. A result letter of this
screening mammogram will be mailed directly to the patient.

RECOMMENDATION:
Screening mammogram in one year. (Code:[5V])

BI-RADS CATEGORY  1: Negative.

## 2020-03-02 ENCOUNTER — Other Ambulatory Visit (INDEPENDENT_AMBULATORY_CARE_PROVIDER_SITE_OTHER): Payer: Self-pay | Admitting: *Deleted

## 2020-03-16 ENCOUNTER — Other Ambulatory Visit (HOSPITAL_COMMUNITY)
Admission: RE | Admit: 2020-03-16 | Discharge: 2020-03-16 | Disposition: A | Discharge status: Home / Self Care | Payer: Medicare HMO | Source: Ambulatory Visit | Attending: Internal Medicine | Admitting: Internal Medicine

## 2020-03-16 ENCOUNTER — Other Ambulatory Visit: Payer: Self-pay

## 2020-03-16 DIAGNOSIS — Z01812 Encounter for preprocedural laboratory examination: Secondary | ICD-10-CM | POA: Insufficient documentation

## 2020-03-16 DIAGNOSIS — Z20822 Contact with and (suspected) exposure to covid-19: Secondary | ICD-10-CM | POA: Diagnosis not present

## 2020-03-17 LAB — SARS CORONAVIRUS 2 (TAT 6-24 HRS): SARS Coronavirus 2: NEGATIVE

## 2020-03-18 ENCOUNTER — Other Ambulatory Visit: Payer: Self-pay

## 2020-03-18 ENCOUNTER — Encounter (HOSPITAL_COMMUNITY): Payer: Self-pay | Admitting: Internal Medicine

## 2020-03-18 ENCOUNTER — Ambulatory Visit (HOSPITAL_COMMUNITY)
Admission: RE | Admit: 2020-03-18 | Discharge: 2020-03-18 | Disposition: A | Discharge status: Home / Self Care | Payer: Medicare HMO | Attending: Internal Medicine | Admitting: Internal Medicine

## 2020-03-18 ENCOUNTER — Encounter (HOSPITAL_COMMUNITY)
Admission: RE | Disposition: A | Discharge status: Home / Self Care | Payer: Self-pay | Source: Home / Self Care | Attending: Internal Medicine

## 2020-03-18 DIAGNOSIS — R0789 Other chest pain: Secondary | ICD-10-CM | POA: Insufficient documentation

## 2020-03-18 DIAGNOSIS — Z881 Allergy status to other antibiotic agents status: Secondary | ICD-10-CM | POA: Diagnosis not present

## 2020-03-18 DIAGNOSIS — Z79899 Other long term (current) drug therapy: Secondary | ICD-10-CM | POA: Diagnosis not present

## 2020-03-18 DIAGNOSIS — Z90710 Acquired absence of both cervix and uterus: Secondary | ICD-10-CM | POA: Insufficient documentation

## 2020-03-18 DIAGNOSIS — K2100 Gastro-esophageal reflux disease with esophagitis, without bleeding: Secondary | ICD-10-CM | POA: Diagnosis not present

## 2020-03-18 DIAGNOSIS — F329 Major depressive disorder, single episode, unspecified: Secondary | ICD-10-CM | POA: Diagnosis not present

## 2020-03-18 DIAGNOSIS — K449 Diaphragmatic hernia without obstruction or gangrene: Secondary | ICD-10-CM | POA: Diagnosis not present

## 2020-03-18 DIAGNOSIS — R1310 Dysphagia, unspecified: Secondary | ICD-10-CM | POA: Insufficient documentation

## 2020-03-18 DIAGNOSIS — Z820 Family history of epilepsy and other diseases of the nervous system: Secondary | ICD-10-CM | POA: Diagnosis not present

## 2020-03-18 DIAGNOSIS — I10 Essential (primary) hypertension: Secondary | ICD-10-CM | POA: Insufficient documentation

## 2020-03-18 DIAGNOSIS — Z8249 Family history of ischemic heart disease and other diseases of the circulatory system: Secondary | ICD-10-CM | POA: Insufficient documentation

## 2020-03-18 DIAGNOSIS — Z9049 Acquired absence of other specified parts of digestive tract: Secondary | ICD-10-CM | POA: Diagnosis not present

## 2020-03-18 DIAGNOSIS — Z841 Family history of disorders of kidney and ureter: Secondary | ICD-10-CM | POA: Diagnosis not present

## 2020-03-18 DIAGNOSIS — K5010 Crohn's disease of large intestine without complications: Secondary | ICD-10-CM | POA: Diagnosis not present

## 2020-03-18 DIAGNOSIS — Z803 Family history of malignant neoplasm of breast: Secondary | ICD-10-CM | POA: Insufficient documentation

## 2020-03-18 DIAGNOSIS — Z888 Allergy status to other drugs, medicaments and biological substances status: Secondary | ICD-10-CM | POA: Insufficient documentation

## 2020-03-18 DIAGNOSIS — K317 Polyp of stomach and duodenum: Secondary | ICD-10-CM | POA: Insufficient documentation

## 2020-03-18 DIAGNOSIS — Z887 Allergy status to serum and vaccine status: Secondary | ICD-10-CM | POA: Diagnosis not present

## 2020-03-18 DIAGNOSIS — Z791 Long term (current) use of non-steroidal anti-inflammatories (NSAID): Secondary | ICD-10-CM | POA: Diagnosis not present

## 2020-03-18 DIAGNOSIS — K219 Gastro-esophageal reflux disease without esophagitis: Secondary | ICD-10-CM

## 2020-03-18 DIAGNOSIS — F419 Anxiety disorder, unspecified: Secondary | ICD-10-CM | POA: Insufficient documentation

## 2020-03-18 HISTORY — PX: ESOPHAGOGASTRODUODENOSCOPY: SHX5428

## 2020-03-18 SURGERY — EGD (ESOPHAGOGASTRODUODENOSCOPY)
Anesthesia: Moderate Sedation

## 2020-03-18 MED ORDER — ESOMEPRAZOLE MAGNESIUM 40 MG PO CPDR: 40.0000 mg | DELAYED_RELEASE_CAPSULE | Freq: Every day | ORAL | 5 refills | Status: DC

## 2020-03-18 MED ORDER — MIDAZOLAM HCL 5 MG/5ML IJ SOLN
INTRAMUSCULAR | Status: AC
  Filled 2020-03-18: qty 10

## 2020-03-18 MED ORDER — MEPERIDINE HCL 50 MG/ML IJ SOLN
INTRAMUSCULAR | Status: DC | PRN
Start: 2020-03-18 — End: 2020-03-18
  Administered 2020-03-18: 10 mg via INTRAVENOUS
  Administered 2020-03-18: 20 mg via INTRAVENOUS

## 2020-03-18 MED ORDER — FAMOTIDINE 20 MG PO TABS: 20.0000 mg | ORAL_TABLET | Freq: Every day | ORAL | Status: DC

## 2020-03-18 MED ORDER — SODIUM CHLORIDE 0.9 % IV SOLN: INTRAVENOUS | Status: DC

## 2020-03-18 MED ORDER — MEPERIDINE HCL 50 MG/ML IJ SOLN
INTRAMUSCULAR | Status: AC
  Filled 2020-03-18: qty 1

## 2020-03-18 MED ORDER — MIDAZOLAM HCL 5 MG/5ML IJ SOLN
INTRAMUSCULAR | Status: DC | PRN
  Administered 2020-03-18: 1 mg via INTRAVENOUS
  Administered 2020-03-18: 2 mg via INTRAVENOUS

## 2020-03-18 MED ORDER — LIDOCAINE VISCOUS HCL 2 % MT SOLN
OROMUCOSAL | Status: AC
  Filled 2020-03-18: qty 15

## 2020-03-18 NOTE — Op Note (Signed)
[facility] Patient Name: Annette Briggs Procedure Date: 03/18/2020 12:02 PM MRN: [id number] Date of Birth: 07-16-1940 Attending MD: Hildred Laser , MD CSN: [id number] Age: 79 Admit Type: Outpatient Procedure:                Upper GI endoscopy Indications:              Chest pain (non cardiac) Providers:                Hildred Laser, MD, Caprice Kluver, Casimer Bilis, Technician Referring MD:             Delphina Cahill, MD Medicines:                Lidocaine spray, Meperidine 30 mg IV, Midazolam 3                            mg IV Complications:            No immediate complications. Estimated Blood Loss:     Estimated blood loss: none. Procedure:                Pre-Anesthesia Assessment:                           - Prior to the procedure, a History and Physical                            was performed, and patient medications and                            allergies were reviewed. The patient's tolerance of                            previous anesthesia was also reviewed. The risks                            and benefits of the procedure and the sedation                            options and risks were discussed with the patient.                            All questions were answered, and informed consent                            was obtained. Prior Anticoagulants: The patient has                            taken no previous anticoagulant or antiplatelet                            agents. ASA Grade Assessment: II - A patient with  mild systemic disease. After reviewing the risks                            and benefits, the patient was deemed in                            satisfactory condition to undergo the procedure.                           After obtaining informed consent, the endoscope was                            passed under direct vision. Throughout the                            procedure, the patient's blood  pressure, pulse, and                            oxygen saturations were monitored continuously. The                            [id number]-[id number] ([id number]) scope was introduced through the                            mouth, and advanced to the second part of duodenum.                            The upper GI endoscopy was accomplished without                            difficulty. The patient tolerated the procedure                            well. Scope In: 12:40:18 PM Scope Out: 12:46:23 PM Total Procedure Duration: 0 hours 6 minutes 5 seconds  Findings:      The hypopharynx was normal.      The examined esophagus was normal ecept focal edema and erythema at GEJ.      The Z-line was regular and was found 30 cm from the incisors.      A 4 cm hiatal hernia was present.      A few diminutive sessile polyps were found in the gastric fundus.      The exam of the stomach was otherwise normal.      The duodenal bulb and second portion of the duodenum were normal. Impression:               - Normal hypopharynx.                           - Mild changes of reflux esophagitis at GEJ.                           - Z-line regular, 30 cm from the incisors.                           -  4 cm hiatal hernia.                           - A few gastric polyps. These were left alone.                           - Normal duodenal bulb and second portion of the                            duodenum.                           - No specimens collected. Moderate Sedation:      Moderate (conscious) sedation was administered by the endoscopy nurse       and supervised by the endoscopist. The following parameters were       monitored: oxygen saturation, heart rate, blood pressure, CO2       capnography and response to care. Total physician intraservice time was       10 minutes. Recommendation:           - Patient has a contact number available for                            emergencies. The signs and symptoms of potential                             delayed complications were discussed with the                            patient. Return to normal activities tomorrow.                            Written discharge instructions were provided to the                            patient.                           - Resume previous diet today.                           - Stop taking Pantoprazole but continue other                            medications.                           - Use Nexium (esomeprazole) 40 mg PO qam.                           - Use Pepcid (famotidine) 20 mg PO qhs.                           - Return to GI clinic in 8 weeks. Procedure Code(s):        --- Professional ---  38101, Esophagogastroduodenoscopy, flexible,                            transoral; diagnostic, including collection of                            specimen(s) by brushing or washing, when performed                            (separate procedure)                           G0500, Moderate sedation services provided by the                            same physician or other qualified health care                            professional performing a gastrointestinal                            endoscopic service that sedation supports,                            requiring the presence of an independent trained                            observer to assist in the monitoring of the                            patient's level of consciousness and physiological                            status; initial 15 minutes of intra-service time;                            patient age 26 years or older (additional time may                            be reported with [phone], as appropriate) Diagnosis Code(s):        --- Professional ---                           K44.9, Diaphragmatic hernia without obstruction or                            gangrene                           K31.7, Polyp of stomach and duodenum                            R07.89, Other chest pain CPT copyright 2019 American Medical Association. All rights reserved. The codes documented in this report are preliminary and upon coder review may  be revised to meet current compliance requirements. [employer],  MD Hildred Laser, MD 03/18/2020 1:01:09 PM This report has been signed electronically. Number of Addenda: 0

## 2020-03-18 NOTE — H&P (Signed)
Annette Briggs is an 79 y.o. female.   Chief Complaint: Patient is here for esophagogastroduodenoscopy and possible esophageal dilation. HPI: Patient is 79 year old Caucasian female who has chronic GERD maintained on PPI who has been experiencing exercise-induced retrosternal pain.  She also has experienced nausea without vomiting.  She had cardiac cath last month.  Pain felt to be noncardiac.  She has occasional dysphagia but nothing bothersome.  She has not had any episode of food impaction.  She says she has gained few pounds since her physical activity has been limited because of Covid pandemic.  She also has noted chest pain when she plays pickle ball. She denies abdominal pain melena or rectal bleeding.  Past Medical History:  Diagnosis Date  . Anxiety   . Crohn's colitis ([place])   . Depression   . Essential hypertension   . GERD (gastroesophageal reflux disease)     Past Surgical History:  Procedure Laterality Date  . APPENDECTOMY    . BLADDER SUSPENSION    . CHOLECYSTECTOMY    . COLONOSCOPY     2008  . COLONOSCOPY N/A 02/13/2013   Procedure: COLONOSCOPY;  Surgeon: [name], MD;  [facility]: [facility];  Service: Endoscopy;  Laterality: N/A;  730  . COLONOSCOPY N/A 01/15/2019   Procedure: COLONOSCOPY;  Surgeon: [name], MD;  Location: [facility];  Service: Endoscopy;  Laterality: N/A;  100  . LEFT HEART CATH AND CORONARY ANGIOGRAPHY N/A 02/02/2020   Procedure: LEFT HEART CATH AND CORONARY ANGIOGRAPHY;  Surgeon: Sherren Mocha, MD;  Location: [facility];  Service: Cardiovascular;  Laterality: N/A;  . TOTAL ABDOMINAL HYSTERECTOMY      Family History  Problem Relation Age of Onset  . Heart failure Mother   . Kidney failure Father   . Breast cancer Sister   . Alzheimer's disease Sister    Social History:  reports that she has never smoked. She has never used smokeless tobacco. She reports that she does not drink alcohol and does not use  drugs.  Allergies:  Allergies  Allergen Reactions  . Ciprofloxacin Other (See Comments)    Patient can't remember reaction.  . Pneumovax [Pneumococcal Polysaccharide Vaccine] Swelling  . Wellbutrin [Bupropion] Anxiety    Anxiety attack    Facility-Administered Medications Prior to Admission  Medication Dose Route Frequency Provider Last Rate Last Admin  . sodium chloride flush (NS) 0.9 % injection 3 mL  3 mL Intravenous Q12H Satira Sark, MD       Medications Prior to Admission  Medication Sig Dispense Refill  . acetaminophen (TYLENOL) 500 MG tablet Take 500-1,000 mg by mouth every 6 (six) hours as needed for moderate pain or headache.     Marland Kitchen atorvastatin (LIPITOR) 10 MG tablet Take 10 mg by mouth at bedtime.    . Cholecalciferol (DIALYVITE VITAMIN D 5000) 125 MCG (5000 UT) capsule Take 5,000 Units by mouth daily.    Marland Kitchen dicyclomine (BENTYL) 20 MG tablet Take 20 mg by mouth 3 (three) times daily as needed for spasms.    . mesalamine (PENTASA) 500 MG CR capsule Take 2 capsules (1,000 mg total) by mouth 3 (three) times daily. (Patient taking differently: Take 1,000-1,500 mg by mouth 3 (three) times daily. Take 1000 mg by mouth in the morning and 1500 mg in the afternoon) 180 capsule 6  . metoprolol succinate (TOPROL-XL) 25 MG 24 hr tablet Take 12.5 mg by mouth at bedtime.    . Multiple Vitamin (MULTIVITAMIN) tablet Take  1 tablet by mouth daily.    . Multiple Vitamins-Minerals (AIRBORNE) CHEW Chew 1 tablet by mouth daily.    Marland Kitchen OVER THE COUNTER MEDICATION Take 1 tablet by mouth daily. cognitex elite otc supplement    . pantoprazole (PROTONIX) 40 MG tablet Take 40 mg by mouth at bedtime.     Marland Kitchen venlafaxine XR (EFFEXOR-XR) 37.5 MG 24 hr capsule Take 37.5 mg by mouth daily.    Marland Kitchen bismuth subsalicylate (PEPTO BISMOL) 262 MG chewable tablet Chew 262 mg by mouth as needed for indigestion. (Patient not taking: Reported on 02/09/2020)    . [place]-Glucosamine-Vit D (OSTEO BI-FLEX ONE PER DAY) TABS  Take 0.5 tablets by mouth daily. (Patient not taking: Reported on 03/11/2020)    . calcium carbonate (TUMS - DOSED IN MG ELEMENTAL CALCIUM) 500 MG chewable tablet Chew 1 tablet by mouth at bedtime as needed for indigestion or heartburn.  (Patient not taking: Reported on 03/11/2020)    . Calcium Carbonate-Vitamin D (CALCIUM 600+D PO) Take 1 tablet by mouth 3 (three) times a week. (Patient not taking: Reported on 03/11/2020)    . diclofenac Sodium (VOLTAREN) 1 % GEL Apply 1 application topically 4 (four) times daily as needed (knee pain). (Patient not taking: Reported on 03/11/2020)    . diphenhydrAMINE (BENADRYL) 25 MG tablet Take 25 mg by mouth daily as needed for allergies. (Patient not taking: Reported on 03/11/2020)    . ibuprofen (ADVIL) 200 MG tablet Take 200-400 mg by mouth every 6 (six) hours as needed for moderate pain. (Patient not taking: Reported on 03/11/2020)    . LORazepam (ATIVAN) 0.5 MG tablet Take 0.5 mg by mouth daily as needed for anxiety. (Patient not taking: Reported on 02/09/2020)    . zinc gluconate 50 MG tablet Take 50 mg by mouth 3 (three) times a week. (Patient not taking: Reported on 03/11/2020)      Results for orders placed or performed during the hospital encounter of 03/16/20 (from the past 48 hour(s))  SARS CORONAVIRUS 2 (TAT 6-24 HRS) Nasopharyngeal Nasopharyngeal Swab     Status: None   Collection Time: 03/16/20  3:00 PM   Specimen: Nasopharyngeal Swab  Result Value Ref Range   SARS Coronavirus 2 NEGATIVE NEGATIVE    Comment: (NOTE) SARS-CoV-2 target nucleic acids are NOT DETECTED.  The SARS-CoV-2 RNA is generally detectable in upper and lower respiratory specimens during the acute phase of infection. Negative results do not preclude SARS-CoV-2 infection, do not rule out co-infections with other pathogens, and should not be used as the sole basis for treatment or other patient management decisions. Negative results must be combined with clinical observations, patient  history, and epidemiological information. The expected result is Negative.  Fact Sheet for Patients: SugarRoll.be  Fact Sheet for Healthcare Providers: https://www.woods-mathews.com/  This test is not yet approved or cleared by the [place] FDA and  has been authorized for detection and/or diagnosis of SARS-CoV-2 by FDA under an Emergency Use Authorization (EUA). This EUA will remain  in effect (meaning this test can be used) for the duration of the COVID-19 declaration under Se ction 564(b)(1) of the Act, 21 U.S.C. section 360bbb-3(b)(1), unless the authorization is terminated or revoked sooner.  Performed at [facility], [place] [address], [place], [place] [address]    No results found.  Review of Systems  Blood pressure (!) 158/77, pulse 75, temperature 98.1 F (36.7 C), temperature source Oral, resp. rate 14, height 5' 1"  (1.549 m), SpO2 95 %. Physical Exam  HENT:     Mouth/Throat:     Mouth: Mucous membranes are moist.     Pharynx: Oropharynx is clear.  Eyes:     General: No scleral icterus.    Conjunctiva/sclera: Conjunctivae normal.  Cardiovascular:     Rate and Rhythm: Normal rate and regular rhythm.     Heart sounds: Normal heart sounds. No murmur heard.   Pulmonary:     Effort: Pulmonary effort is normal.     Breath sounds: Normal breath sounds.  Abdominal:     General: There is no distension.     Palpations: Abdomen is soft. There is no mass.     Tenderness: There is no abdominal tenderness.  Musculoskeletal:        General: No swelling.     Cervical back: Neck supple.  Lymphadenopathy:     Cervical: No cervical adenopathy.  Skin:    General: Skin is warm and dry.  Neurological:     Mental Status: She is alert.      Assessment/Plan  Atypical chest pain. Cardiac source ruled out. History of GERD. Diagnostic esophagogastroduodenoscopy.  Hildred Laser, MD 03/18/2020, 12:31 PM

## 2020-03-18 NOTE — Discharge Instructions (Signed)
Upper Endoscopy, Adult, Care After This sheet gives you information about how to care for yourself after your procedure. Your health care provider may also give you more specific instructions. If you have problems or questions, contact your health care provider. What can I expect after the procedure? After the procedure, it is common to have:  A sore throat.  Mild stomach pain or discomfort.  Bloating.  Nausea. Follow these instructions at home:   Follow instructions from your health care provider about what to eat or drink after your procedure.  Return to your normal activities as told by your health care provider. Ask your health care provider what activities are safe for you.  Take over-the-counter and prescription medicines only as told by your health care provider.  Do not drive for 24 hours if you were given a sedative during your procedure.  Keep all follow-up visits as told by your health care provider. This is important. Contact a health care provider if you have:  A sore throat that lasts longer than one day.  Trouble swallowing. Get help right away if:  You vomit blood or your vomit looks like coffee grounds.  You have: ? A fever. ? Bloody, black, or tarry stools. ? A severe sore throat or you cannot swallow. ? Difficulty breathing. ? Severe pain in your chest or abdomen. Summary  After the procedure, it is common to have a sore throat, mild stomach discomfort, bloating, and nausea.  Do not drive for 24 hours if you were given a sedative during the procedure.  Follow instructions from your health care provider about what to eat or drink after your procedure.  Return to your normal activities as told by your health care provider. This information is not intended to replace advice given to you by your health care provider. Make sure you discuss any questions you have with your health care provider. Document Revised: 12/11/2017 Document Reviewed:  11/19/2017 Elsevier Patient Education  [place] pantoprazole. Resume other medications as before. Take Nexium/esomeprazole 40 mg by mouth 30 minutes before breakfast daily. Take Pepcid/famotidine OTC 20 mg by mouth daily at bedtime. Resume usual diet. Consider doing physical activity or playing pickle ball at least 2 hours after meal or before meal. Keep symptom diary as to timing and duration of chest pain until office visit in 8 weeks. No driving for 24 hours.     Gastric Polyps A gastric polyp, also called a stomach polyp, is a growth on the lining of the stomach. Most polyps are not dangerous, but some can be harmful because of their size, location, or type. Polyps that can become harmful include:  Large polyps. These can turn into sores (ulcers). Ulcers can lead to stomach bleeding.  Polyps that block food from moving from the stomach to the small intestine (gastric outlet obstruction).  A type of polyp called an adenoma. This type of polyp can become cancerous. What are the causes? Gastric polyps form when the lining of the stomach gets inflamed or damaged. Stomach inflammation and damage may be caused by:  A long-lasting stomach condition, such as gastritis.  Certain medicines used to reduce stomach acid.  An inherited condition called familial adenomatous polyposis. What are the signs or symptoms? Usually, this condition does not cause any symptoms. If you do have symptoms, they may include:  Pain or tenderness in the abdomen.  Nausea.  Trouble eating or swallowing.  Blood in the stool.  Anemia. How is this  diagnosed? Gastric polyps are diagnosed with:  A medical procedure called endoscopy.  A lab test in which a part of the polyp is examined. This test is done with a sample of polyp tissue (biopsy) taken during an endoscopy. How is this treated? Treatment depends on the type, location, and size of the polyps. Treatment may  involve:  Having the polyps checked regularly with an endoscopy.  Having the polyps removed with an endoscopy. This may be done if the polyps are harmful or can become harmful. Removing a polyp often prevents problems from developing.  Having the polyps removed with a surgery called a partial gastrectomy. This may be done in rare cases to remove very large polyps.  Treating the underlying condition that caused the polyps. Follow these instructions at home:  Take over-the-counter and prescription medicines only as told by your health care provider.  Keep all follow-up visits as told by your health care provider. This is important. Contact a health care provider if:  You develop new symptoms.  Your symptoms get worse. Get help right away if:  You vomit blood.  You have severe abdominal pain.  You cannot eat or drink.  You have blood in your stool. This information is not intended to replace advice given to you by your health care provider. Make sure you discuss any questions you have with your health care provider. Document Revised: 06/01/2017 Document Reviewed: 07/04/2015 Elsevier Patient Education  2020 [place].  Hiatal Hernia  A hiatal hernia occurs when part of the stomach slides above the muscle that separates the abdomen from the chest (diaphragm). A person can be born with a hiatal hernia (congenital), or it may develop over time. In almost all cases of hiatal hernia, only the top part of the stomach pushes through the diaphragm. Many people have a hiatal hernia with no symptoms. The larger the hernia, the more likely it is that you will have symptoms. In some cases, a hiatal hernia allows stomach acid to flow back into the tube that carries food from your mouth to your stomach (esophagus). This may cause heartburn symptoms. Severe heartburn symptoms may mean that you have developed a condition called gastroesophageal reflux disease (GERD). What are the causes? This  condition is caused by a weakness in the opening (hiatus) where the esophagus passes through the diaphragm to attach to the upper part of the stomach. A person may be born with a weakness in the hiatus, or a weakness can develop over time. What increases the risk? This condition is more likely to develop in:  Older people. Age is a major risk factor for a hiatal hernia, especially if you are over the age of 59.  Pregnant women.  People who are overweight.  People who have frequent constipation. What are the signs or symptoms? Symptoms of this condition usually develop in the form of GERD symptoms. Symptoms include:  Heartburn.  Belching.  Indigestion.  Trouble swallowing.  Coughing or wheezing.  Sore throat.  Hoarseness.  Chest pain.  Nausea and vomiting. How is this diagnosed? This condition may be diagnosed during testing for GERD. Tests that may be done include:  X-rays of your stomach or chest.  An upper gastrointestinal (GI) series. This is an X-ray exam of your GI tract that is taken after you swallow a chalky liquid that shows up clearly on the X-ray.  Endoscopy. This is a procedure to look into your stomach using a thin, flexible tube that has a tiny camera and  light on the end of it. How is this treated? This condition may be treated by:  Dietary and lifestyle changes to help reduce GERD symptoms.  Medicines. These may include: ? Over-the-counter antacids. ? Medicines that make your stomach empty more quickly. ? Medicines that block the production of stomach acid (H2 blockers). ? Stronger medicines to reduce stomach acid (proton pump inhibitors).  Surgery to repair the hernia, if other treatments are not helping. If you have no symptoms, you may not need treatment. Follow these instructions at home: Lifestyle and activity  Do not use any products that contain nicotine or tobacco, such as cigarettes and e-cigarettes. If you need help quitting, ask your  health care provider.  Try to achieve and maintain a healthy body weight.  Avoid putting pressure on your abdomen. Anything that puts pressure on your abdomen increases the amount of acid that may be pushed up into your esophagus. ? Avoid bending over, especially after eating. ? Raise the head of your bed by putting blocks under the legs. This keeps your head and esophagus higher than your stomach. ? Do not wear tight clothing around your chest or stomach. ? Try not to strain when having a bowel movement, when urinating, or when lifting heavy objects. Eating and drinking  Avoid foods that can worsen GERD symptoms. These may include: ? Fatty foods, like fried foods. ? Citrus fruits, like oranges or lemon. ? Other foods and drinks that contain acid, like orange juice or tomatoes. ? Spicy food. ? Chocolate.  Eat frequent small meals instead of three large meals a day. This helps prevent your stomach from getting too full. ? Eat slowly. ? Do not lie down right after eating. ? Do not eat 1-2 hours before bed.  Do not drink beverages with caffeine. These include cola, coffee, cocoa, and tea.  Do not drink alcohol. General instructions  Take over-the-counter and prescription medicines only as told by your health care provider.  Keep all follow-up visits as told by your health care provider. This is important. Contact a health care provider if:  Your symptoms are not controlled with medicines or lifestyle changes.  You are having trouble swallowing.  You have coughing or wheezing that will not go away. Get help right away if:  Your pain is getting worse.  Your pain spreads to your arms, neck, jaw, teeth, or back.  You have shortness of breath.  You sweat for no reason.  You feel sick to your stomach (nauseous) or you vomit.  You vomit blood.  You have bright red blood in your stools.  You have black, tarry stools. This information is not intended to replace advice given  to you by your health care provider. Make sure you discuss any questions you have with your health care provider. Document Revised: 06/01/2017 Document Reviewed: 01/22/2017 Elsevier Patient Education  [place].

## 2020-03-22 ENCOUNTER — Ambulatory Visit (INDEPENDENT_AMBULATORY_CARE_PROVIDER_SITE_OTHER): Payer: Medicare HMO | Admitting: Gastroenterology

## 2020-03-23 ENCOUNTER — Encounter (HOSPITAL_COMMUNITY): Payer: Self-pay | Admitting: Internal Medicine

## 2020-04-14 DIAGNOSIS — E782 Mixed hyperlipidemia: Secondary | ICD-10-CM | POA: Diagnosis not present

## 2020-04-14 DIAGNOSIS — R202 Paresthesia of skin: Secondary | ICD-10-CM | POA: Diagnosis not present

## 2020-04-14 DIAGNOSIS — M25559 Pain in unspecified hip: Secondary | ICD-10-CM | POA: Diagnosis not present

## 2020-04-14 DIAGNOSIS — E7849 Other hyperlipidemia: Secondary | ICD-10-CM | POA: Diagnosis not present

## 2020-04-14 DIAGNOSIS — I10 Essential (primary) hypertension: Secondary | ICD-10-CM | POA: Diagnosis not present

## 2020-04-14 DIAGNOSIS — K5000 Crohn's disease of small intestine without complications: Secondary | ICD-10-CM | POA: Diagnosis not present

## 2020-04-14 DIAGNOSIS — Z23 Encounter for immunization: Secondary | ICD-10-CM | POA: Diagnosis not present

## 2020-05-13 ENCOUNTER — Encounter (INDEPENDENT_AMBULATORY_CARE_PROVIDER_SITE_OTHER): Payer: Self-pay | Admitting: Gastroenterology

## 2020-05-13 ENCOUNTER — Other Ambulatory Visit (INDEPENDENT_AMBULATORY_CARE_PROVIDER_SITE_OTHER): Payer: Self-pay | Admitting: *Deleted

## 2020-05-13 ENCOUNTER — Ambulatory Visit (INDEPENDENT_AMBULATORY_CARE_PROVIDER_SITE_OTHER): Payer: Medicare HMO | Admitting: Gastroenterology

## 2020-05-13 ENCOUNTER — Other Ambulatory Visit: Payer: Self-pay

## 2020-05-13 VITALS — BP 149/81 | HR 75 | Temp 99.5°F | Ht 61.5 in | Wt 141.0 lb

## 2020-05-13 DIAGNOSIS — K5080 Crohn's disease of both small and large intestine without complications: Secondary | ICD-10-CM | POA: Insufficient documentation

## 2020-05-13 DIAGNOSIS — K2100 Gastro-esophageal reflux disease with esophagitis, without bleeding: Secondary | ICD-10-CM | POA: Diagnosis not present

## 2020-05-13 DIAGNOSIS — D510 Vitamin B12 deficiency anemia due to intrinsic factor deficiency: Secondary | ICD-10-CM | POA: Diagnosis not present

## 2020-05-13 DIAGNOSIS — E559 Vitamin D deficiency, unspecified: Secondary | ICD-10-CM | POA: Diagnosis not present

## 2020-05-13 DIAGNOSIS — K50819 Crohn's disease of both small and large intestine with unspecified complications: Secondary | ICD-10-CM

## 2020-05-13 MED ORDER — ESOMEPRAZOLE MAGNESIUM 40 MG PO CPDR: 40.0000 mg | DELAYED_RELEASE_CAPSULE | Freq: Every day | ORAL | 5 refills | Status: DC

## 2020-05-13 NOTE — Progress Notes (Signed)
Annette Briggs, M.D. Gastroenterology & Hepatology [facility] [address] [place], [place] [address]  Primary Care Physician: Celene Squibb, MD [place] [id number]  I will communicate my assessment and recommendations to the referring MD via EMR. "Note: Occasional unusual wording and randomly placed punctuation marks may result from the use of speech recognition technology to transcribe this document"  Problems: 1. Ileocolonic Crohn's disease  History of Present Illness: Annette Briggs is a 79 y.o. female with past medical history of ileocolonic Crohn's disease, anxiety, hypertension, depression and GERD, who presents for follow up of her chronic diarrhea.  The patient was last seen on 8 9 2021. At that time, the patient was presenting a typical chest pain. She was counseled about possibly starting her on higher dose PPI twice daily but she decided to continue taking it once a day. An EGD was performed for evaluation of her pain which showed a 4 cm hiatal hernia and some gastric polyps but no other source for her pain.  Patient comes today for follow-up of her follow-up of her diarrhea. She reports that she has 2-4 watery bowel movements per day chronically since she was diagnosed with Crohn's disease. She reports that sometimes she has fecal soiling, so she has to wear pads. States she has urgency frequently and sometimes has bowel movements during the night. She takes Pentasa daily but she has not noticed any major difference in her bowel movements while taking the medication. She only reports having improvement of her abdominal pain while taking the medicine, which is not present at the moment. The patient denies having any nausea, vomiting, fever, chills, hematochezia, melena, hematemesis, abdominal distention, abdominal pain, jaundice, pruritus or weight loss.  Patient reports that while taking Nexium 40 mg she does  not have any heartburn. States these symptoms are controlled if she takes the medication compliantly. Needs a refill.  Patient only endorses having some L knee pain but no other arthralgias. No EIM.  Last EGD: 03/18/2020 - 4 cm hiatal hernia, polyps in gastric fundus Last Colonoscopy: 01/15/2019 - diverticulosis, presence of purulent discharge at site of diverticula sugestive of diverticulitis. Overall no evidence of active colonic Crohn's disease. However, no mention of TI inspection in report.  Last flu shot: not yet this year Last pneumonia shot: received it in the past but had an allergic reaction Last evaluation by dermatology: waiting to make an appointment Last DEXA scan: 2016 - osteopenia COVID-19 shot: has received one shot of Moderna vaccine  Past Medical History: Past Medical History:  Diagnosis Date  . Anxiety   . Crohn's colitis ([place])   . Depression   . Essential hypertension   . GERD (gastroesophageal reflux disease)     Past Surgical History: Past Surgical History:  Procedure Laterality Date  . APPENDECTOMY    . BLADDER SUSPENSION    . CHOLECYSTECTOMY    . COLONOSCOPY     2008  . COLONOSCOPY N/A 02/13/2013   Procedure: COLONOSCOPY;  Surgeon: [name], MD;  [facility];  Service: Endoscopy;  Laterality: N/A;  730  . COLONOSCOPY N/A 01/15/2019   Procedure: COLONOSCOPY;  Surgeon: [name], MD;  [facility];  Service: Endoscopy;  Laterality: N/A;  100  . ESOPHAGOGASTRODUODENOSCOPY N/A 03/18/2020   Procedure: ESOPHAGOGASTRODUODENOSCOPY (EGD);  Surgeon: [name], MD;  [facility];  Service: Endoscopy;  Laterality: N/A;  225, moved up per office  .  LEFT HEART CATH AND CORONARY ANGIOGRAPHY N/A 02/02/2020   Procedure: LEFT HEART CATH AND CORONARY ANGIOGRAPHY;  Surgeon: Sherren Mocha, MD;  Location: [place];  Service: Cardiovascular;  Laterality: N/A;  . TOTAL ABDOMINAL HYSTERECTOMY      Family  History: Family History  Problem Relation Age of Onset  . Heart failure Mother   . Kidney failure Father   . Breast cancer Sister   . Alzheimer's disease Sister     Social History: Social History   Tobacco Use  Smoking Status Never Smoker  Smokeless Tobacco Never Used   Social History   Substance and Sexual Activity  Alcohol Use No  . Alcohol/week: 0.0 standard drinks   Social History   Substance and Sexual Activity  Drug Use No    Allergies: Allergies  Allergen Reactions  . Ciprofloxacin Other (See Comments)    Patient can't remember reaction.  . Pneumovax [Pneumococcal Polysaccharide Vaccine] Swelling  . Wellbutrin [Bupropion] Anxiety    Anxiety attack    Medications: Current Outpatient Medications  Medication Sig Dispense Refill  . acetaminophen (TYLENOL) 500 MG tablet Take 500-1,000 mg by mouth every 6 (six) hours as needed for moderate pain or headache.     Marland Kitchen atorvastatin (LIPITOR) 10 MG tablet Take 10 mg by mouth at bedtime.    . Cholecalciferol (DIALYVITE VITAMIN D 5000) 125 MCG (5000 UT) capsule Take 5,000 Units by mouth daily.    . famotidine (PEPCID) 20 MG tablet Take 1 tablet (20 mg total) by mouth at bedtime.    . mesalamine (PENTASA) 500 MG CR capsule Take 2 capsules (1,000 mg total) by mouth 3 (three) times daily. (Patient taking differently: Take 1,000-1,500 mg by mouth 3 (three) times daily. Take 1000 mg by mouth in the morning and 1500 mg in the afternoon) 180 capsule 6  . metoprolol succinate (TOPROL-XL) 25 MG 24 hr tablet Take 12.5 mg by mouth at bedtime.    . Misc Natural Products (OSTEO BI-FLEX ADV JOINT SHIELD PO) Take 1 tablet by mouth daily.    . Multiple Vitamin (MULTIVITAMIN) tablet Take 1 tablet by mouth daily.    . Multiple Vitamins-Minerals (AIRBORNE) CHEW Chew 1 tablet by mouth daily.    Marland Kitchen OVER THE COUNTER MEDICATION Take 1 tablet by mouth daily. cognitex elite otc supplement    . venlafaxine XR (EFFEXOR-XR) 37.5 MG 24 hr capsule Take  37.5 mg by mouth daily.    Marland Kitchen dicyclomine (BENTYL) 20 MG tablet Take 0.5 tablets (10 mg total) by mouth 3 (three) times daily as needed for spasms. (Patient not taking: Reported on 05/13/2020)    . esomeprazole (NEXIUM) 40 MG capsule Take 1 capsule (40 mg total) by mouth daily before breakfast. 30 capsule 5   Current Facility-Administered Medications  Medication Dose Route Frequency Provider Last Rate Last Admin  . sodium chloride flush (NS) 0.9 % injection 3 mL  3 mL Intravenous Q12H Satira Sark, MD        Review of Systems: GENERAL: negative for malaise, night sweats HEENT: No changes in hearing or vision, no nose bleeds or other nasal problems. NECK: Negative for lumps, goiter, pain and significant neck swelling RESPIRATORY: Negative for cough, wheezing CARDIOVASCULAR: Negative for chest pain, leg swelling, palpitations, orthopnea GI: SEE HPI MUSCULOSKELETAL: Negative for joint pain or swelling, back pain, and muscle pain. SKIN: Negative for lesions, rash PSYCH: Negative for sleep disturbance, mood disorder and recent psychosocial stressors. HEMATOLOGY Negative for prolonged bleeding, bruising easily, and swollen nodes. ENDOCRINE:  Negative for cold or heat intolerance, polyuria, polydipsia and goiter. NEURO: negative for tremor, gait imbalance, syncope and seizures. The remainder of the review of systems is noncontributory.   Physical Exam: BP (!) 149/81 (BP Location: Right Arm, Patient Position: Sitting, Cuff Size: Large)   Pulse 75   Temp 99.5 F (37.5 C) (Oral)   Ht 5' 1.5" (1.562 m)   Wt 141 lb (64 kg)   BMI 26.21 kg/m  GENERAL: The patient is AO x3, in no acute distress. HEENT: Head is normocephalic and atraumatic. EOMI are intact. Mouth is well hydrated and without lesions. NECK: Supple. No masses LUNGS: Clear to auscultation. No presence of rhonchi/wheezing/rales. Adequate chest expansion HEART: RRR, normal s1 and s2. ABDOMEN: Soft, nontender, no guarding, no  peritoneal signs, and nondistended. BS +. No masses. EXTREMITIES: Without any cyanosis, clubbing, rash, lesions or edema. NEUROLOGIC: AOx3, no focal motor deficit. SKIN: no jaundice, no rashes  Imaging/Labs: as above  I personally reviewed and interpreted the available labs, imaging and endoscopic files.  Impression and Plan: Annette Briggs is a 79 y.o. female with past medical history of ileocolonic Crohn's disease, anxiety, hypertension, depression and GERD, who presents for follow up of her chronic diarrhea. Patient has presented improvement in her abdominal pain after she started Pentasa 3 years ago but is still having persistent episodes of diarrhea, fecal soiling and fecal urgency. She had previous areas of disease activity in her small bowel. Unfortunately, the evaluation of small bowel exactly was not expecting the last colonoscopy. I suspect her symptoms are likely related to some degree of active Crohn's disease in the small bowel, for which we will investigate this further with CT enterography and fecal calprotectin. We will also order blood testing today as part of her Crohn's disease surveillance and will rule out C. difficile as a cause of uncontrolled diarrhea. She should continue for now her Pentasa as prior. Regarding the preventive measures measures measures for IBD, the patient was counseled about the importance of obtaining the second shot for her Covid and also getting her flu shot this year. She will need to follow-up with her dermatologist annually and I will order a DEXA scan ordered a DEXA scan due to history of osteopenia. In terms of her GERD, it has been better controlled with the intake of Nexium which will be refilled today as he has controlled her symptoms adequately.  - Schedule CT enterography with IV contrast - Check CBC, CMP, Zn, B12, Vit D, CRP, TPMT - Check C. Diff and calprotectin - Continue nexium 40 mg daily - Schedule DEXA scan - Patient to follow up with  dermatologist - Schedule second COVID vaccine and flu shot - Continue Pentasa - RTC 3 months  All questions were answered.      Harvel Quale, MD Gastroenterology and Hepatology [facility]

## 2020-05-13 NOTE — Patient Instructions (Addendum)
Schedule CT enterography with IV contrast Perform blood workup Perform stool workup Continue nexium daily Schedule DEXA scan Follow up with dermatologist Schedule second COVID vaccine and flu shot Continue Pentasa

## 2020-05-14 DIAGNOSIS — K50819 Crohn's disease of both small and large intestine with unspecified complications: Secondary | ICD-10-CM | POA: Diagnosis not present

## 2020-05-14 DIAGNOSIS — K2100 Gastro-esophageal reflux disease with esophagitis, without bleeding: Secondary | ICD-10-CM | POA: Diagnosis not present

## 2020-05-19 LAB — CBC WITH DIFFERENTIAL/PLATELET
Absolute Monocytes: 262 cells/uL (ref 200–950)
Basophils Absolute: 41 cells/uL (ref 0–200)
Basophils Relative: 0.9 %
Eosinophils Absolute: 143 cells/uL (ref 15–500)
Eosinophils Relative: 3.1 %
HCT: 38.9 % (ref 35.0–45.0)
Hemoglobin: 13.5 g/dL (ref 11.7–15.5)
Lymphs Abs: 975 cells/uL (ref 850–3900)
MCH: 29.6 pg (ref 27.0–33.0)
MCHC: 34.7 g/dL (ref 32.0–36.0)
MCV: 85.3 fL (ref 80.0–100.0)
MPV: 9 fL (ref 7.5–12.5)
Monocytes Relative: 5.7 %
Neutro Abs: 3179 cells/uL (ref 1500–7800)
Neutrophils Relative %: 69.1 %
Platelets: 202 10*3/uL (ref 140–400)
RBC: 4.56 10*6/uL (ref 3.80–5.10)
RDW: 12.9 % (ref 11.0–15.0)
Total Lymphocyte: 21.2 %
WBC: 4.6 10*3/uL (ref 3.8–10.8)

## 2020-05-19 LAB — VITAMIN D 25 HYDROXY (VIT D DEFICIENCY, FRACTURES): Vit D, 25-Hydroxy: 77 ng/mL (ref 30–100)

## 2020-05-19 LAB — COMPREHENSIVE METABOLIC PANEL
AG Ratio: 1.9 (calc) (ref 1.0–2.5)
ALT: 18 U/L (ref 6–29)
AST: 21 U/L (ref 10–35)
Albumin: 4.2 g/dL (ref 3.6–5.1)
Alkaline phosphatase (APISO): 92 U/L (ref 37–153)
BUN: 14 mg/dL (ref 7–25)
CO2: 25 mmol/L (ref 20–32)
Calcium: 9.3 mg/dL (ref 8.6–10.4)
Chloride: 105 mmol/L (ref 98–110)
Creat: 0.71 mg/dL (ref 0.60–0.88)
Globulin: 2.2 g/dL (calc) (ref 1.9–3.7)
Glucose, Bld: 152 mg/dL — ABNORMAL HIGH (ref 65–139)
Potassium: 3.7 mmol/L (ref 3.5–5.3)
Sodium: 139 mmol/L (ref 135–146)
Total Bilirubin: 0.4 mg/dL (ref 0.2–1.2)
Total Protein: 6.4 g/dL (ref 6.1–8.1)

## 2020-05-19 LAB — C-REACTIVE PROTEIN: CRP: 3.6 mg/L (ref <8.0)

## 2020-05-19 LAB — VITAMIN B12: Vitamin B-12: 293 pg/mL (ref 200–1100)

## 2020-05-19 LAB — ZINC: Zinc: 60 ug/dL (ref 60–130)

## 2020-05-19 LAB — THIOPURINE METHYLTRANSFERASE (TPMT), RBC: Thiopurine Methyltransferase, RBC: 14 nmol/hr/mL RBC

## 2020-05-20 LAB — C. DIFFICILE GDH AND TOXIN A/B
GDH ANTIGEN: NOT DETECTED
MICRO NUMBER:: 11198386
SPECIMEN QUALITY:: ADEQUATE
TOXIN A AND B: NOT DETECTED

## 2020-05-20 LAB — CALPROTECTIN: Calprotectin: 67 mcg/g

## 2020-05-24 ENCOUNTER — Encounter (INDEPENDENT_AMBULATORY_CARE_PROVIDER_SITE_OTHER): Payer: Self-pay

## 2020-05-31 ENCOUNTER — Other Ambulatory Visit: Payer: Self-pay

## 2020-05-31 ENCOUNTER — Ambulatory Visit (HOSPITAL_COMMUNITY)
Admission: RE | Admit: 2020-05-31 | Discharge: 2020-05-31 | Disposition: A | Discharge status: Home / Self Care | Payer: Medicare HMO | Source: Ambulatory Visit | Attending: Gastroenterology | Admitting: Gastroenterology

## 2020-05-31 DIAGNOSIS — M818 Other osteoporosis without current pathological fracture: Secondary | ICD-10-CM | POA: Insufficient documentation

## 2020-05-31 DIAGNOSIS — Z13820 Encounter for screening for osteoporosis: Secondary | ICD-10-CM | POA: Diagnosis not present

## 2020-05-31 DIAGNOSIS — Z780 Asymptomatic menopausal state: Secondary | ICD-10-CM | POA: Insufficient documentation

## 2020-05-31 DIAGNOSIS — M8589 Other specified disorders of bone density and structure, multiple sites: Secondary | ICD-10-CM | POA: Diagnosis not present

## 2020-05-31 DIAGNOSIS — K50819 Crohn's disease of both small and large intestine with unspecified complications: Secondary | ICD-10-CM | POA: Insufficient documentation

## 2020-05-31 DIAGNOSIS — R29890 Loss of height: Secondary | ICD-10-CM | POA: Diagnosis not present

## 2020-06-02 ENCOUNTER — Telehealth (INDEPENDENT_AMBULATORY_CARE_PROVIDER_SITE_OTHER): Payer: Self-pay | Admitting: Gastroenterology

## 2020-06-02 NOTE — Telephone Encounter (Signed)
Patient aware of all lab results and aware we are awaiting results of her CT scan once that is done.

## 2020-06-02 NOTE — Telephone Encounter (Signed)
Patient left message regarding lab results - please advise - ph# [phone]

## 2020-06-08 DIAGNOSIS — Z1283 Encounter for screening for malignant neoplasm of skin: Secondary | ICD-10-CM | POA: Diagnosis not present

## 2020-06-08 DIAGNOSIS — X32XXXA Exposure to sunlight, initial encounter: Secondary | ICD-10-CM | POA: Diagnosis not present

## 2020-06-08 DIAGNOSIS — D225 Melanocytic nevi of trunk: Secondary | ICD-10-CM | POA: Diagnosis not present

## 2020-06-08 DIAGNOSIS — L570 Actinic keratosis: Secondary | ICD-10-CM | POA: Diagnosis not present

## 2020-06-08 DIAGNOSIS — L820 Inflamed seborrheic keratosis: Secondary | ICD-10-CM | POA: Diagnosis not present

## 2020-06-09 DIAGNOSIS — I10 Essential (primary) hypertension: Secondary | ICD-10-CM | POA: Diagnosis not present

## 2020-06-09 DIAGNOSIS — E7849 Other hyperlipidemia: Secondary | ICD-10-CM | POA: Diagnosis not present

## 2020-06-09 DIAGNOSIS — R202 Paresthesia of skin: Secondary | ICD-10-CM | POA: Diagnosis not present

## 2020-06-09 DIAGNOSIS — K5000 Crohn's disease of small intestine without complications: Secondary | ICD-10-CM | POA: Diagnosis not present

## 2020-06-09 DIAGNOSIS — E782 Mixed hyperlipidemia: Secondary | ICD-10-CM | POA: Diagnosis not present

## 2020-06-09 DIAGNOSIS — Z23 Encounter for immunization: Secondary | ICD-10-CM | POA: Diagnosis not present

## 2020-06-09 DIAGNOSIS — M25559 Pain in unspecified hip: Secondary | ICD-10-CM | POA: Diagnosis not present

## 2020-06-14 ENCOUNTER — Ambulatory Visit (HOSPITAL_COMMUNITY)
Admission: RE | Admit: 2020-06-14 | Discharge: 2020-06-14 | Disposition: A | Discharge status: Home / Self Care | Payer: Medicare HMO | Source: Ambulatory Visit | Attending: Gastroenterology | Admitting: Gastroenterology

## 2020-06-14 ENCOUNTER — Other Ambulatory Visit: Payer: Self-pay

## 2020-06-14 DIAGNOSIS — Z90710 Acquired absence of both cervix and uterus: Secondary | ICD-10-CM | POA: Diagnosis not present

## 2020-06-14 DIAGNOSIS — K2100 Gastro-esophageal reflux disease with esophagitis, without bleeding: Secondary | ICD-10-CM | POA: Insufficient documentation

## 2020-06-14 DIAGNOSIS — N8110 Cystocele, unspecified: Secondary | ICD-10-CM | POA: Insufficient documentation

## 2020-06-14 DIAGNOSIS — Z8719 Personal history of other diseases of the digestive system: Secondary | ICD-10-CM | POA: Insufficient documentation

## 2020-06-14 DIAGNOSIS — N8189 Other female genital prolapse: Secondary | ICD-10-CM | POA: Insufficient documentation

## 2020-06-14 DIAGNOSIS — R197 Diarrhea, unspecified: Secondary | ICD-10-CM | POA: Diagnosis not present

## 2020-06-14 DIAGNOSIS — K5730 Diverticulosis of large intestine without perforation or abscess without bleeding: Secondary | ICD-10-CM | POA: Insufficient documentation

## 2020-06-14 DIAGNOSIS — R152 Fecal urgency: Secondary | ICD-10-CM | POA: Diagnosis not present

## 2020-06-14 DIAGNOSIS — I700 Atherosclerosis of aorta: Secondary | ICD-10-CM | POA: Insufficient documentation

## 2020-06-14 DIAGNOSIS — K6389 Other specified diseases of intestine: Secondary | ICD-10-CM | POA: Diagnosis not present

## 2020-06-14 LAB — POCT I-STAT CREATININE: Creatinine, Ser: 0.9 mg/dL (ref 0.44–1.00)

## 2020-06-14 IMAGING — CT CT ENTEROGRAPHY (ABD-PELV W/ CM)
2 of 10 series · 15 of 46 positions shown, 17 images · IV contrast (omnipaque)
Comparison: None.

CLINICAL DATA: Crohn's disease, diarrhea and fecal urgency

EXAM:
CT ABDOMEN AND PELVIS WITH CONTRAST (ENTEROGRAPHY)
TECHNIQUE: Multidetector CT of the abdomen and pelvis during bolus
administration of intravenous contrast. Negative oral contrast was
given.
CONTRAST:  100mL OMNIPAQUE IOHEXOL 300 MG/ML SOLN, additional
negative oral enteric contrast

[Series 5: entero thins · axial · 0.78mm/px · z∈[+1040,+1384]mm · 12 of 200 slices shown, 14 images]
[im 14/200  soft-tissue]
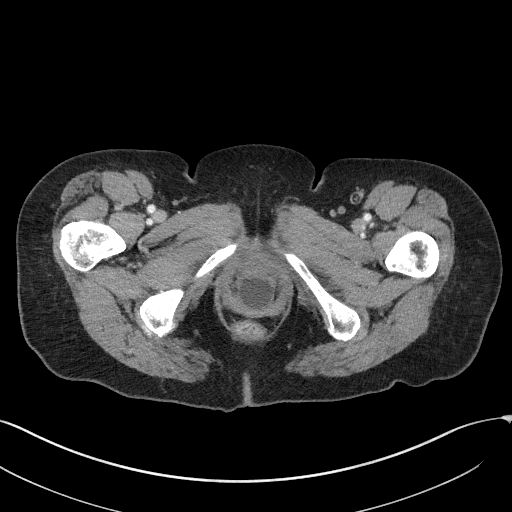
[im 14/200  bone]
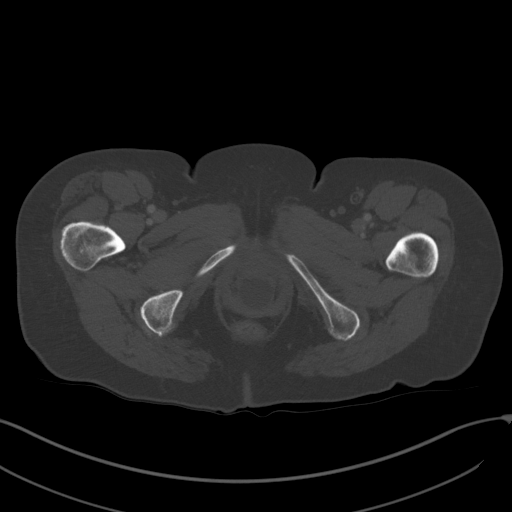
[im 27/200  soft-tissue]
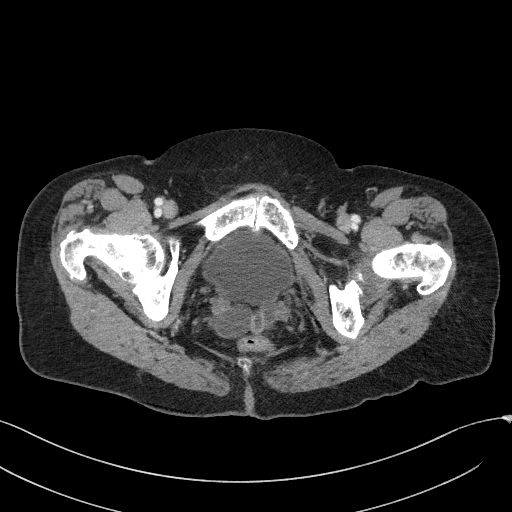
[im 40/200  soft-tissue]
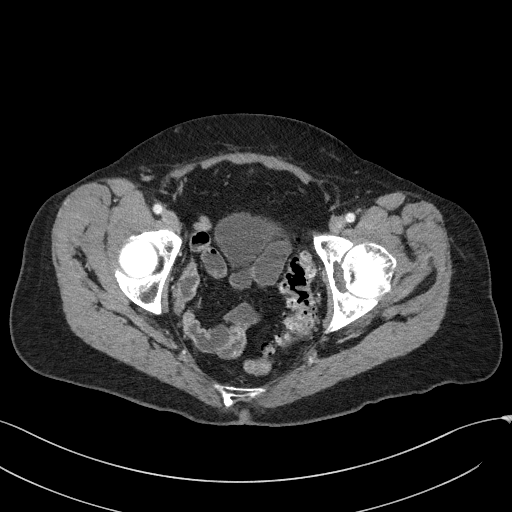
[im 67/200  soft-tissue]
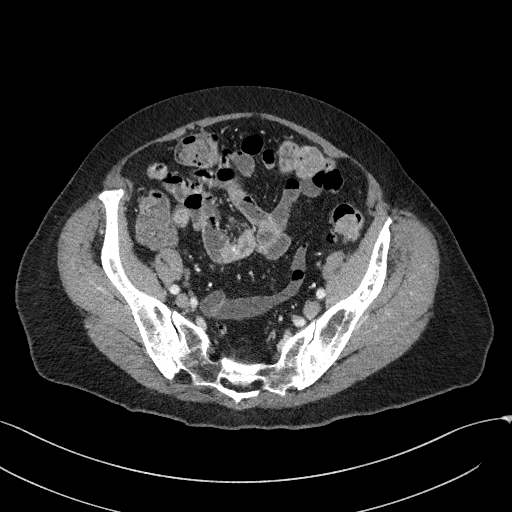
[im 80/200  soft-tissue]
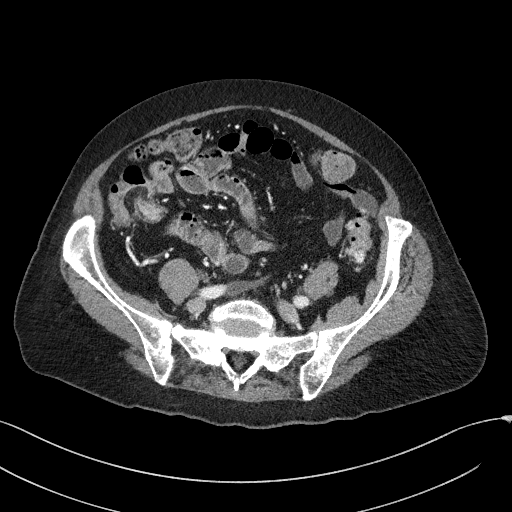
[im 93/200  soft-tissue]
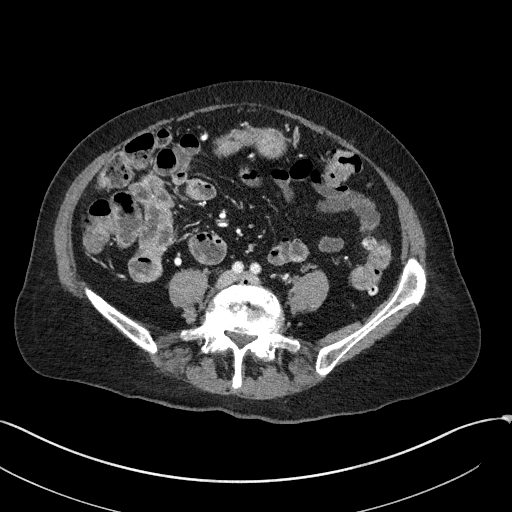
[im 107/200  soft-tissue]
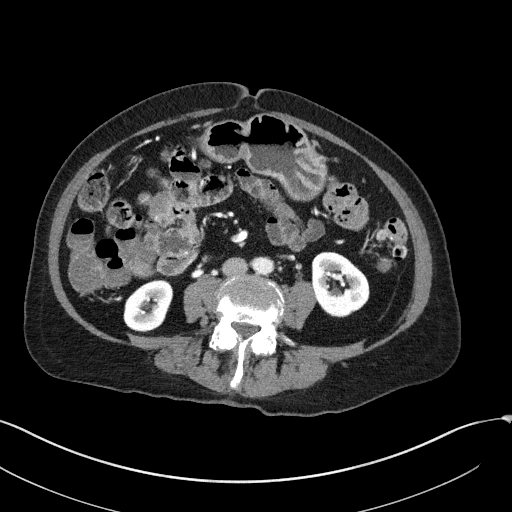
[im 120/200  soft-tissue]
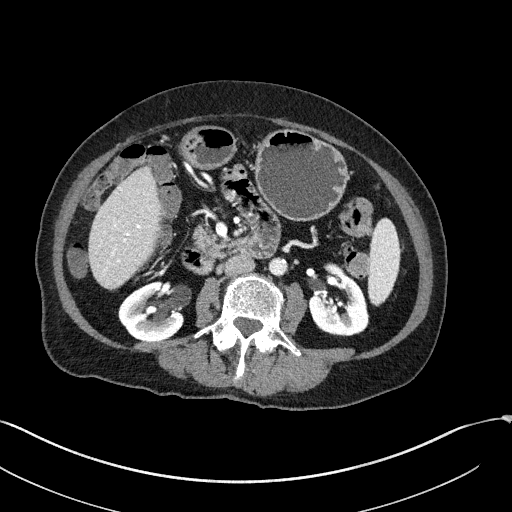
[im 133/200  soft-tissue]
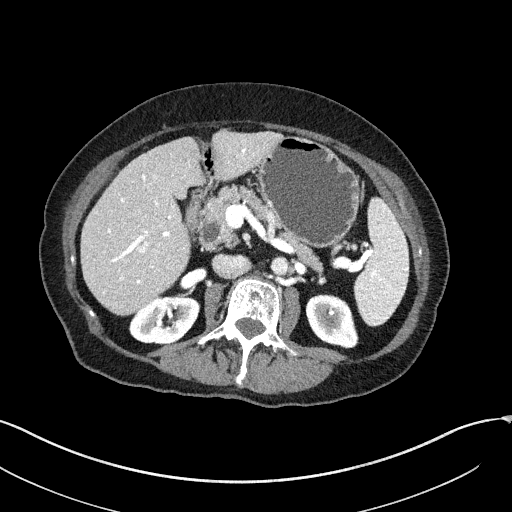
[im 133/200  bone]
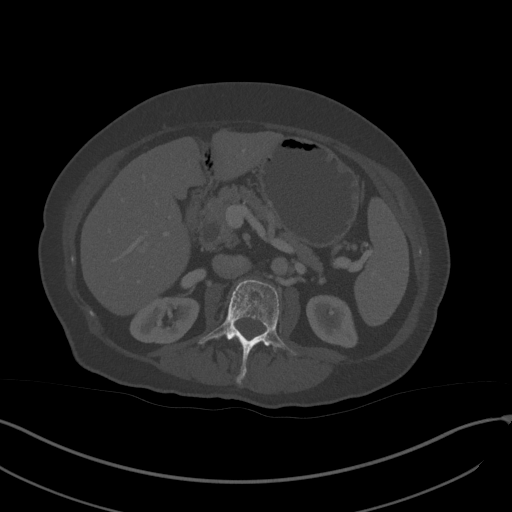
[im 160/200  soft-tissue]
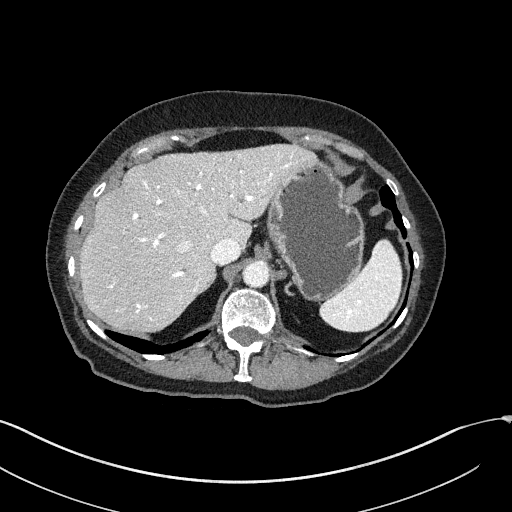
[im 173/200  soft-tissue]
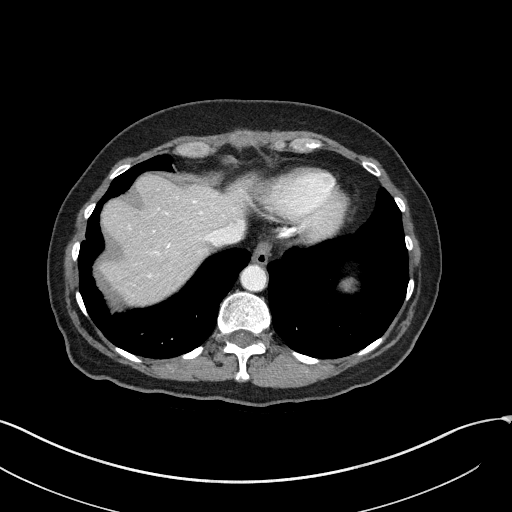
[im 186/200  soft-tissue]
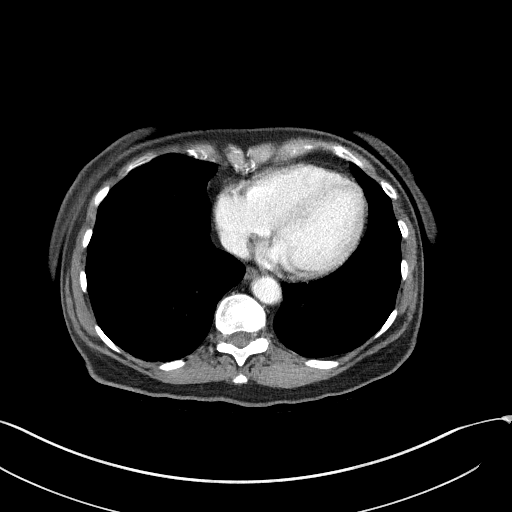

[Series 8: coronal · coronal · 0.64mm/px · 3 of 93 slices shown]
[im 24/93  soft-tissue]
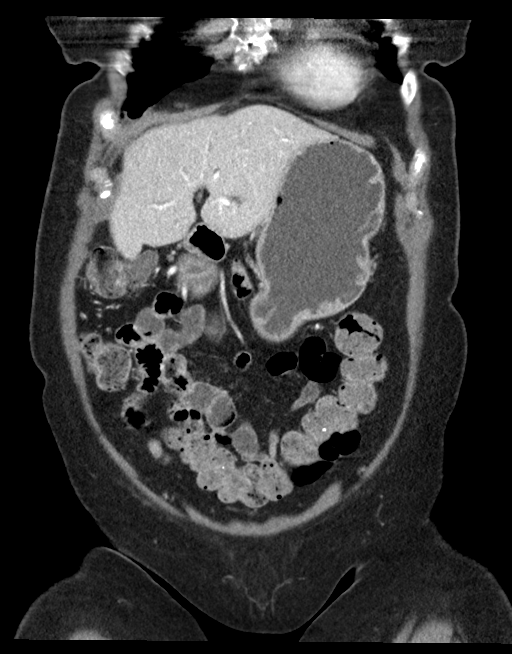
[im 47/93  soft-tissue]
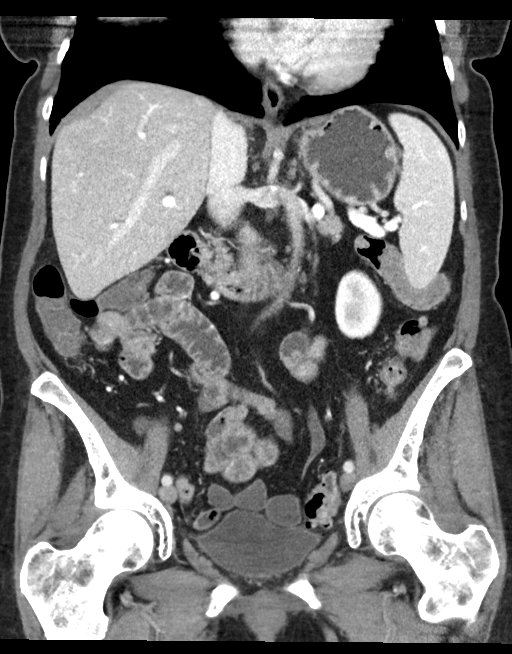
[im 70/93  soft-tissue]
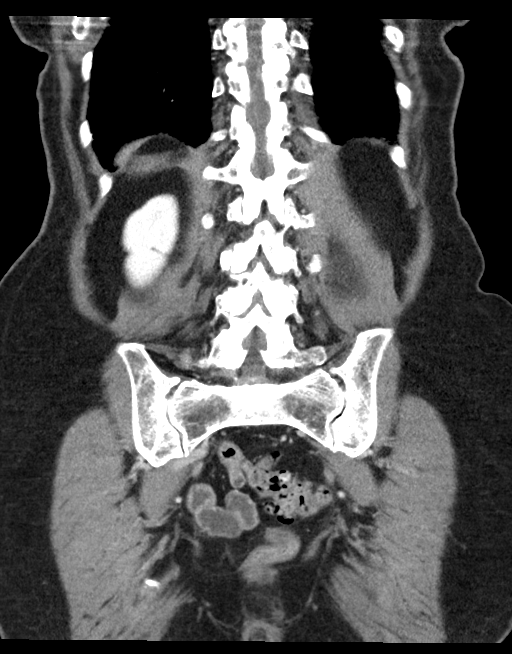

[15 of 46 positions shown; findings below may reference images not displayed]

FINDINGS: Lower chest: No acute abnormality.

Hepatobiliary: No solid liver abnormality is seen. No gallstones,
gallbladder wall thickening, or biliary dilatation.

Pancreas: Unremarkable. No pancreatic ductal dilatation or
surrounding inflammatory changes.

Spleen: Normal in size without significant abnormality.

Adrenals/Urinary Tract: Adrenal glands are unremarkable. Kidneys are
normal, without renal calculi, solid lesion, or hydronephrosis.
Pelvic floor prolapse with significant cystocele (series 9, image
66)

Stomach/Bowel: Stomach is within normal limits. There is wall
thickening, mild hyperenhancement, and fibrofatty mural
stratification of the terminal ileum, a segment approximately 5 cm
in length (series 8, image 37). Descending and sigmoid
diverticulosis. There are multiple somewhat tethered appearing loops
of distal small bowel in the central abdomen and low pelvis without
distended bowel to suggest stricture (series 2, image 52, 67). The
appendix is not clearly visualized and may be surgically absent.
Severe descending and sigmoid diverticulosis without evidence of
acute diverticulitis.

Vascular/Lymphatic: Aortic atherosclerosis. No enlarged abdominal or
pelvic lymph nodes.

Reproductive: Status post hysterectomy.

Other: No abdominal wall hernia or abnormality. No abdominopelvic
ascites.

Musculoskeletal: No acute or significant osseous findings.
IMPRESSION: 1. There is wall thickening, mild hyperenhancement, and fibrofatty
mural stratification of the terminal ileum, a segment approximately
5 cm in length. Findings are consistent with history of Crohn's
disease.
2. There are multiple somewhat tethered appearing loops of distal
small bowel in the central abdomen and low pelvis, generally
suggesting adhesion, without distended bowel to suggest stricture or
other evidence of complication.
3. Descending and sigmoid diverticulosis without evidence of acute
diverticulitis.
4. Pelvic floor prolapse status post hysterectomy with significant
cystocele.

Aortic Atherosclerosis ([QX]-[QX]).

## 2020-06-14 MED ORDER — IOHEXOL 300 MG/ML  SOLN
100.0000 mL | Freq: Once | INTRAMUSCULAR | Status: AC | PRN
  Administered 2020-06-14: 12:00:00 100 mL via INTRAVENOUS

## 2020-07-02 DIAGNOSIS — R69 Illness, unspecified: Secondary | ICD-10-CM | POA: Diagnosis not present

## 2020-07-15 ENCOUNTER — Other Ambulatory Visit: Payer: Self-pay

## 2020-07-15 ENCOUNTER — Telehealth (INDEPENDENT_AMBULATORY_CARE_PROVIDER_SITE_OTHER): Payer: Medicare HMO | Admitting: Internal Medicine

## 2020-07-15 ENCOUNTER — Encounter (INDEPENDENT_AMBULATORY_CARE_PROVIDER_SITE_OTHER): Payer: Self-pay | Admitting: Internal Medicine

## 2020-07-15 DIAGNOSIS — K50919 Crohn's disease, unspecified, with unspecified complications: Secondary | ICD-10-CM | POA: Diagnosis not present

## 2020-07-15 DIAGNOSIS — K219 Gastro-esophageal reflux disease without esophagitis: Secondary | ICD-10-CM

## 2020-07-15 MED ORDER — BUDESONIDE 3 MG PO CPEP: 9.0000 mg | ORAL_CAPSULE | Freq: Every day | ORAL | 1 refills | Status: DC

## 2020-07-15 NOTE — Progress Notes (Addendum)
Virtual Visit via Telephone Note  I connected with Annette Briggs on 07/15/20 at  3:30 PM EST by telephone and verified that I am speaking with the correct person using two identifiers.  Location: Patient: home Provider: office   I discussed the limitations, risks, security and privacy concerns of performing an evaluation and management service by telephone and the availability of in person appointments. I also discussed with the patient that there may be a patient responsible charge related to this service. The patient expressed understanding and agreed to proceed.   History of Present Illness:  Patient is 80 year old Caucasian female who was diagnosed with ileal Crohn's disease back in October 2008 when she had colonoscopy by Dr. Gale Journey in [facility].  She had mild to no symptoms and therefore was not on any therapy when we first saw her in August 2014.  She was not having any symptoms diarrhea abdominal pain or rectal bleeding to suggest active disease. She underwent surveillance colonoscopy by me in August 2014. She was found to have ulcers involving terminal ileum as well as sigmoid colon.  She also had diverticula in the descending and sigmoid colon and 2 small tubular adenomas were removed from ascending colon.  Patient was treated with 6-week course of budesonide and begun on Pentasa which she has continued. She underwent surveillance colonoscopy in July 2020.  TI could not be intubated.  She had sigmoid diverticulitis but no evidence of active Crohn's disease involving the colon.  She was treated with antibiotics and maintained on Pentasa. She also had EGD for atypical chest pain in August 2021 revealing 4 cm sliding hiatal hernia and few gastric polyps.  Patient presented to the office 2 months ago with diarrhea and episodes of fecal soiling.  She was seen by Dr. Jenetta Downer.  She denied abdominal pain melena rectal bleeding fever chills nausea or vomiting.  Her weight has  been stable. Work-up included CBC and comprehensive chemistry panel, zinc, vitamin D, B12 levels as well as C-reactive protein fecal calprotectin and C. difficile antigen and titer as well as TPMT assay.  Results are reviewed below. She also had normal bone density. CT enterography wall thickening mild hyperenhancement and fibrofatty mural stratification involving 5 cm of terminal ileum.  She also had diverticuli descending and sigmoid colon without evidence of diverticulitis.  She did not have dilation of small bowel proximal to the segment.  These findings were felt to be consistent with Crohn's disease.  S respiration symptoms are concerned she has 3-4 stools per day and they almost always occur before noon.  She has been using Pepto-Bismol which helps.  If she takes Pepto-Bismol tablet more than 3 to 4 days she becomes constipated.  She therefore has been using Pepto-Bismol tablet on as-needed basis.  She denies abdominal pain melena or rectal bleeding.  Her appetite is good and her weight has been stable.  She has taken loperamide in the past but she does not remember if it worked or if she had any side effects. She says her heartburn is well controlled with PPI in the morning and famotidine at bedtime.    Observations/Objective:  Weight reported to be 141 pounds.  WBC 4.6, H&H 13.5 and 38.9.  Platelet count 202K. Sodium 137, potassium 3.7, chloride 105, CO2 25 Glucose 152, BUN 14, creatinine 0.71 Serum calcium 9.3 Bilirubin 0.4, AP 92, AST 41, ALT 18, total protein 6.4 and albumin 4.2.  CRP 3.6 which is normal Vitamin D level 77 Serum  B12 293 Serum zinc 60  C. difficile stool antigen and toxins negative.  Fecal calprotectin 67(normal less than 50)  TPMT assay 14 which is normal  Assessment and Plan:  #1.  Crohn's disease..  She has been well documented to have ileal and colonic Crohn's disease in the past.  Last colonoscopy was in July 2020 revealing no active colonic disease  but TI could not be intubated.  CT enterography shows localized disease and terminal ileum without stricture as there is no small bowel dilation upstream.  Her CRP is normal and fecal calprotectin is not in an abnormal range. Her symptom includes diarrhea which has responded to low-dose Pepto-Bismol which would make me wonder her diarrhea may be due to reasons other than small bowel Crohn's disease. At any rate she appears to have indolent Crohn's disease.  She certainly is not a candidate for biologic therapy.  TPMT assay is normal.  Low-dose immunomodulator would be reasonable. First we will treat her with budesonide and go from there.  Patient will start budesonide 9 mg daily for 2 weeks and then drop dose to 6 mg daily.  She will call office with progress report in 4 weeks. Patient advised to take Pepto-Bismol daily.  She can take half a tablet as 1 tablet makes her constipated.  She may consider taking it at bedtime since most of her bowel movements occur in the morning.  #2.  GERD.  She is doing well with therapy.  Follow Up Instructions:  Patient will call with progress report in 4 weeks at which time we will determine changes to budesonide dosing and timing of her next office visit.  I discussed the assessment and treatment plan with the patient. The patient was provided an opportunity to ask questions and all were answered. The patient agreed with the plan and demonstrated an understanding of the instructions.   The patient was advised to call back or seek an in-person evaluation if the symptoms worsen or if the condition fails to improve as anticipated.  I provided 17 minutes of non-face-to-face time during this encounter.   Hildred Laser, MD

## 2020-07-21 ENCOUNTER — Telehealth (INDEPENDENT_AMBULATORY_CARE_PROVIDER_SITE_OTHER): Payer: Self-pay | Admitting: *Deleted

## 2020-07-21 NOTE — Telephone Encounter (Signed)
The patient's prescription was called as per Bernita Buffy to be called on January 18, for her Pentasa. Patient was called and a message was left on her voicemail that she should be getting this delivered by [employer].

## 2020-07-28 DIAGNOSIS — Z23 Encounter for immunization: Secondary | ICD-10-CM | POA: Diagnosis not present

## 2020-07-28 DIAGNOSIS — H9202 Otalgia, left ear: Secondary | ICD-10-CM | POA: Diagnosis not present

## 2020-08-12 ENCOUNTER — Ambulatory Visit (INDEPENDENT_AMBULATORY_CARE_PROVIDER_SITE_OTHER): Payer: Medicare HMO | Admitting: Gastroenterology

## 2020-08-24 ENCOUNTER — Telehealth (INDEPENDENT_AMBULATORY_CARE_PROVIDER_SITE_OTHER): Payer: Self-pay | Admitting: Internal Medicine

## 2020-08-24 NOTE — Telephone Encounter (Signed)
Patient left message stating to let you know she has finished taking the medication - please advise - ph# [phone]

## 2020-08-26 NOTE — Telephone Encounter (Signed)
Patient called again but no answer.

## 2020-08-27 NOTE — Telephone Encounter (Signed)
Patient called the office states she has finished taking the budesonide - states she needs to know what to do about her medication - please advise - ph# [phone]

## 2020-08-30 DIAGNOSIS — I10 Essential (primary) hypertension: Secondary | ICD-10-CM | POA: Diagnosis not present

## 2020-08-30 DIAGNOSIS — K5090 Crohn's disease, unspecified, without complications: Secondary | ICD-10-CM | POA: Diagnosis not present

## 2020-08-30 DIAGNOSIS — E782 Mixed hyperlipidemia: Secondary | ICD-10-CM | POA: Diagnosis not present

## 2020-08-30 DIAGNOSIS — H814 Vertigo of central origin: Secondary | ICD-10-CM | POA: Diagnosis not present

## 2020-08-30 NOTE — Telephone Encounter (Signed)
Patient was called back, a message was left. I ask that she call the office back so that we may find out how she is feeling since taking the Budesonide.

## 2020-09-02 ENCOUNTER — Telehealth (INDEPENDENT_AMBULATORY_CARE_PROVIDER_SITE_OTHER): Payer: Self-pay | Admitting: *Deleted

## 2020-09-02 NOTE — Telephone Encounter (Signed)
Patient states that she is feeling so much better , just feels so good. She completed the Budesonide about 2 weeks ago . The first week after completing the the medication she states that her intestines hurt. This got better . She is having a normal bowel movement every day sometime she will have a BM twice a day. She is now taking a Imodium 1/2 in the morning and 1/2 in the evening. If she becomes constipated she will only take 1/2 in the morning.   She ask if this was a one time thing taking the Budesonide or will she have to take this longer? Patient advised that this would be addressed with Dr.Rehman and I would call her back later today or in the morning. She states that it would be fine to leave a message.

## 2020-09-02 NOTE — Telephone Encounter (Signed)
Patient states that she is feeling so much better , just feels so good. She completed the Budesonide about 2 weeks ago . The first week after completing the the medication she states that her intestines hurt. This got better . She is having a normal bowel movement every day sometime she will have a BM twice a day. She is now taking a Imodium 1/2 in the morning and 1/2 in the evening. If she becomes constipated she will only take 1/2 in the morning.  She ask if this was a one time thing taking the Budesonide or will she have to take this longer? Patient advised that this would be addressed with Dr.Rehman and I would call her back later today or in the morning. She states that it would be fine to leave a message.

## 2020-09-02 NOTE — Telephone Encounter (Signed)
Forwarded to Dr. Laural Golden - he is going to call the patient.     [phone]

## 2020-09-03 NOTE — Telephone Encounter (Signed)
No answer

## 2020-09-23 NOTE — Telephone Encounter (Signed)
Patient completed budesonide over 4 weeks ago. She says she is doing well.  She is taking Imodium 1 mg twice a day and having soft stools.  She may occasionally have incontinence. She is not having any cramping.  She feels budesonide did help her. Since patient is doing well will hold off immunomodulator or other therapies for Crohn's disease. We will plan to see patient in the office in 3 months. Patient will call if starts having abdominal cramping or Imodium not helping anymore.

## 2020-09-29 DIAGNOSIS — H814 Vertigo of central origin: Secondary | ICD-10-CM | POA: Diagnosis not present

## 2020-09-29 DIAGNOSIS — E782 Mixed hyperlipidemia: Secondary | ICD-10-CM | POA: Diagnosis not present

## 2020-09-29 DIAGNOSIS — I10 Essential (primary) hypertension: Secondary | ICD-10-CM | POA: Diagnosis not present

## 2020-09-29 DIAGNOSIS — K5090 Crohn's disease, unspecified, without complications: Secondary | ICD-10-CM | POA: Diagnosis not present

## 2020-09-30 ENCOUNTER — Other Ambulatory Visit (HOSPITAL_COMMUNITY): Payer: Self-pay | Admitting: Family Medicine

## 2020-09-30 ENCOUNTER — Ambulatory Visit (HOSPITAL_COMMUNITY)
Admission: RE | Admit: 2020-09-30 | Discharge: 2020-09-30 | Disposition: A | Discharge status: Home / Self Care | Payer: Medicare HMO | Source: Ambulatory Visit | Attending: Family Medicine | Admitting: Family Medicine

## 2020-09-30 ENCOUNTER — Other Ambulatory Visit: Payer: Self-pay | Admitting: Family Medicine

## 2020-09-30 ENCOUNTER — Other Ambulatory Visit: Payer: Self-pay

## 2020-09-30 DIAGNOSIS — R519 Headache, unspecified: Secondary | ICD-10-CM

## 2020-09-30 DIAGNOSIS — H539 Unspecified visual disturbance: Secondary | ICD-10-CM | POA: Insufficient documentation

## 2020-09-30 IMAGING — CT CT HEAD W/O CM
3 series · 16 of 47 positions shown, 19 images · non-contrast
Comparison: [DATE]

CLINICAL DATA: Intermittent headache for 1 week.

EXAM:
CT HEAD WITHOUT CONTRAST
TECHNIQUE: Contiguous axial images were obtained from the base of the skull
through the vertex without intravenous contrast.

[Series 2: head w o · axial · 0.47mm/px · z∈[+1221,+1346]mm · 10 of 31 slices shown, 13 images]
[im 3/31  brain]
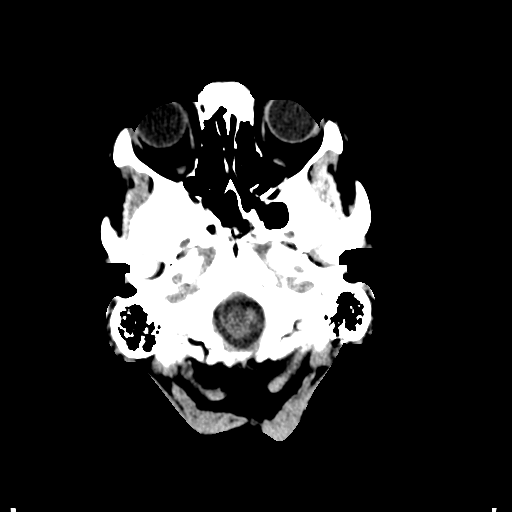
[im 3/31  bone]
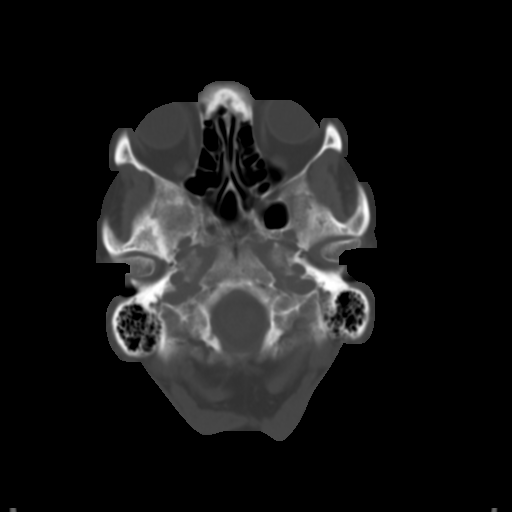
[im 6/31  brain]
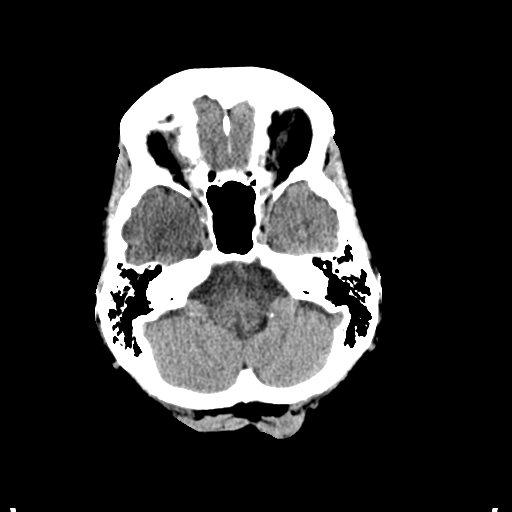
[im 9/31  brain]
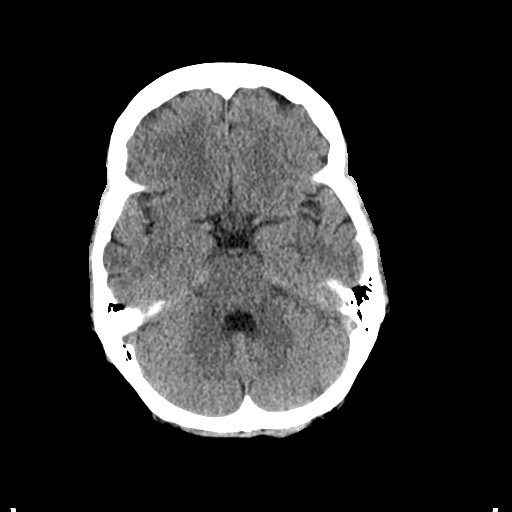
[im 11/31  brain]
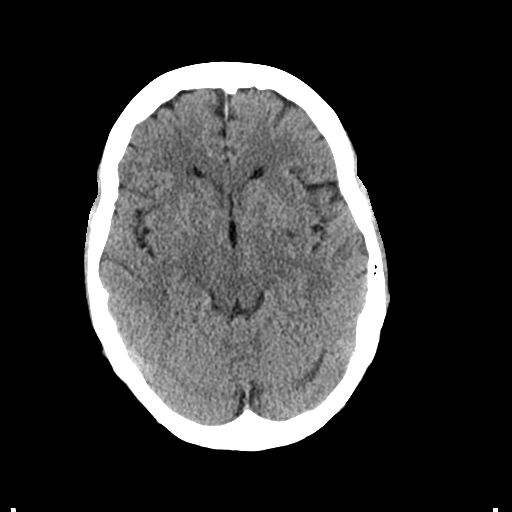
[im 14/31  brain]
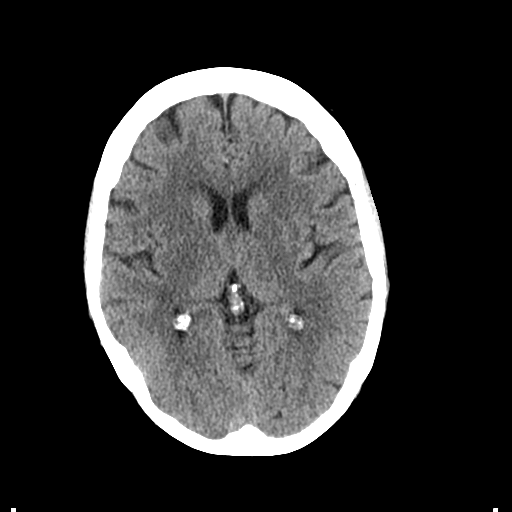
[im 14/31  bone]
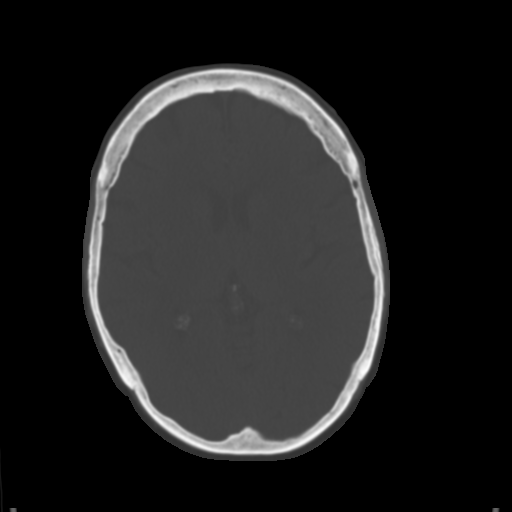
[im 17/31  brain]
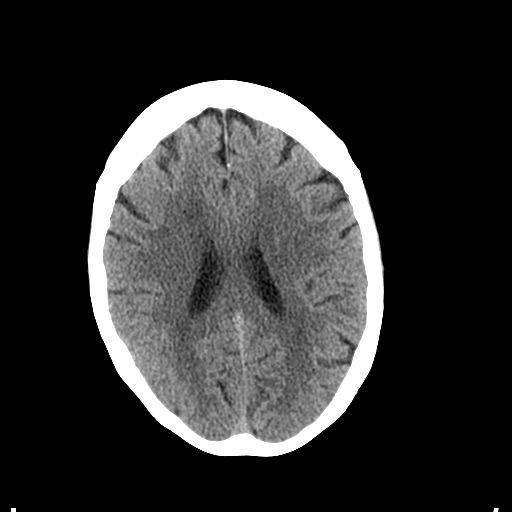
[im 20/31  brain]
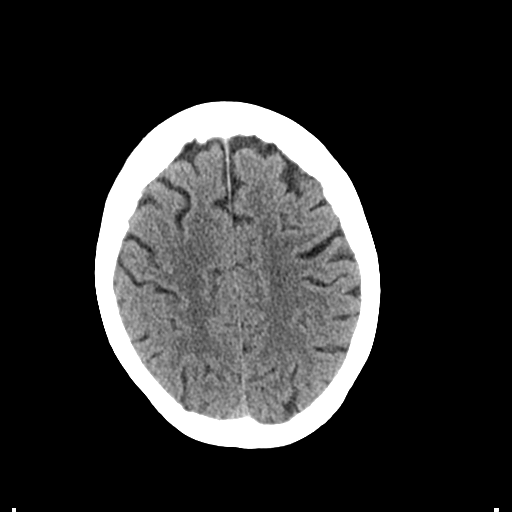
[im 23/31  brain]
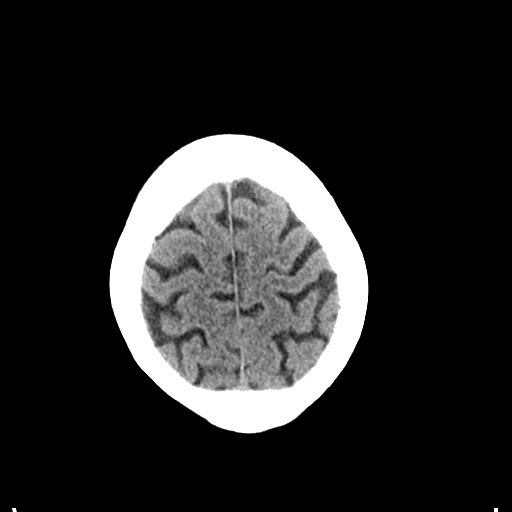
[im 25/31  brain]
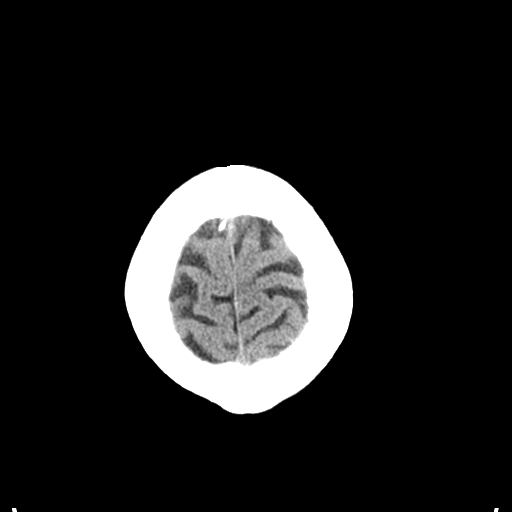
[im 25/31  bone]
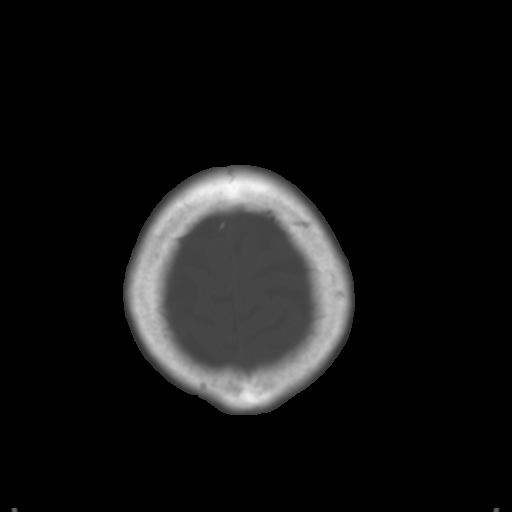
[im 28/31  brain]
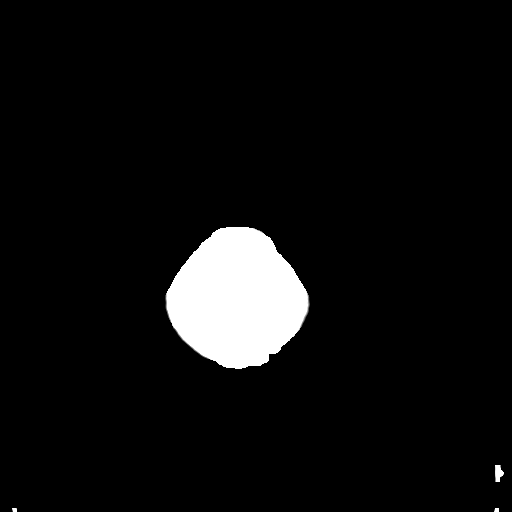

[Series 4: coronal soft · coronal · 0.33mm/px · 3 of 69 slices shown]
[im 23/69  brain]
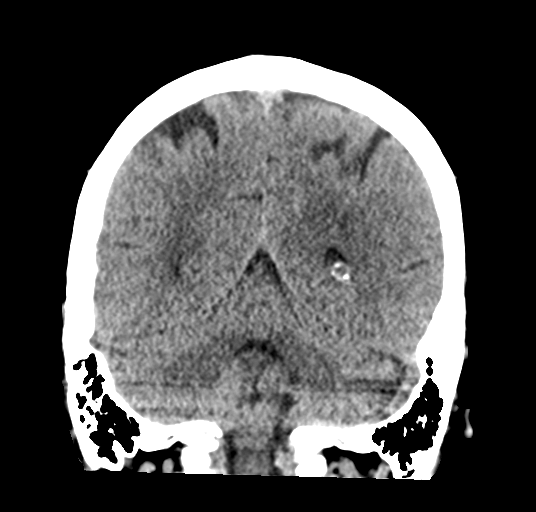
[im 31/69  brain]
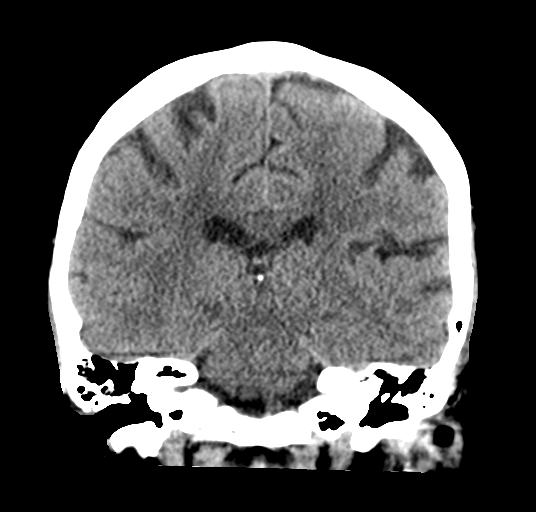
[im 38/69  brain]
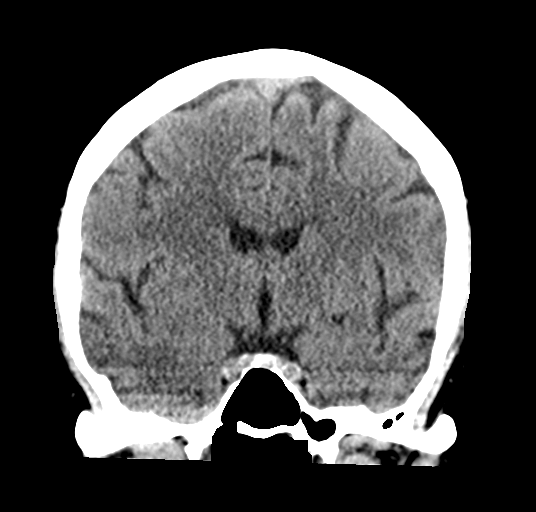

[Series 5: sagittal soft · sagittal · 0.36mm/px · 3 of 57 slices shown]
[im 19/57  brain]
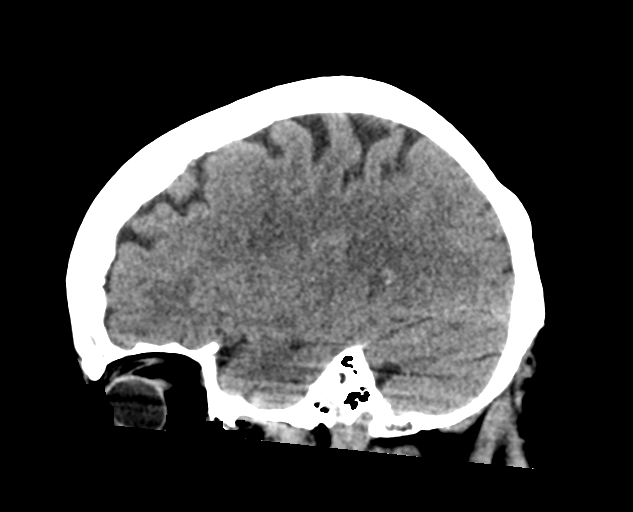
[im 29/57  brain]
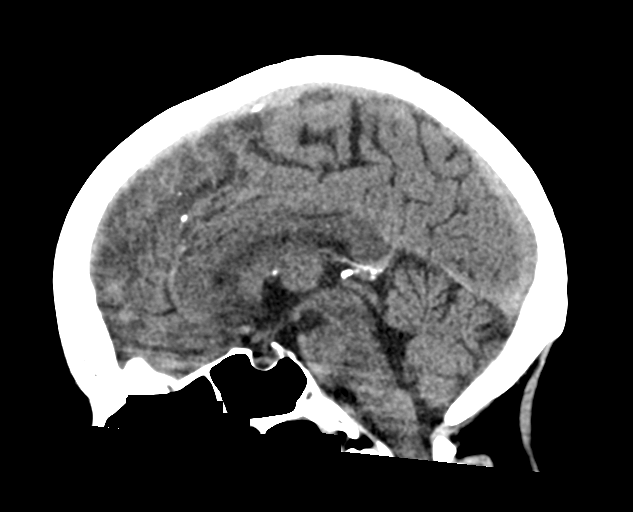
[im 38/57  brain]
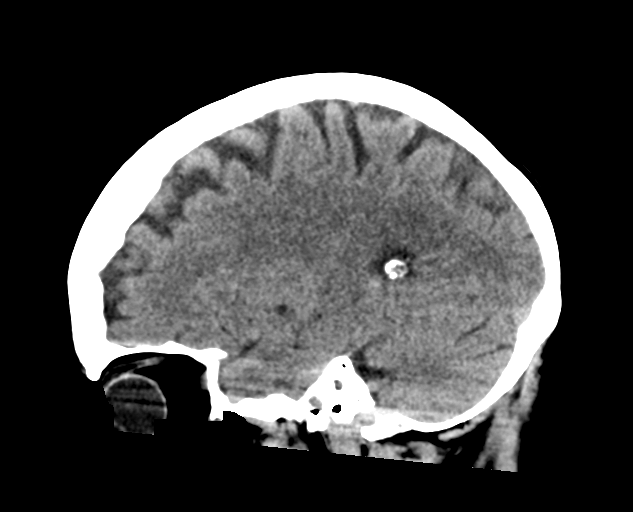

[16 of 47 positions shown; findings below may reference images not displayed]

FINDINGS: Brain: No evidence of acute infarction, hemorrhage, hydrocephalus,
extra-axial collection or mass lesion/mass effect. Remote lacunar
infarcts identified within the basal ganglia bilaterally. There is
mild diffuse low-attenuation within the subcortical and
periventricular white matter compatible with chronic microvascular
disease.

Vascular: No hyperdense vessel or unexpected calcification.

Skull: Normal. Negative for fracture or focal lesion.

Sinuses/Orbits: No acute finding.

Other: None
IMPRESSION: 1. No acute intracranial abnormalities.
2. Chronic small vessel ischemic change.

## 2020-10-22 ENCOUNTER — Other Ambulatory Visit: Payer: Self-pay

## 2020-10-22 ENCOUNTER — Encounter (HOSPITAL_COMMUNITY): Payer: Self-pay

## 2020-10-22 ENCOUNTER — Emergency Department (HOSPITAL_COMMUNITY): Payer: Medicare HMO

## 2020-10-22 ENCOUNTER — Emergency Department (HOSPITAL_COMMUNITY)
Admission: EM | Admit: 2020-10-22 | Discharge: 2020-10-22 | Disposition: A | Discharge status: Home / Self Care | Payer: Medicare HMO | Attending: Emergency Medicine | Admitting: Emergency Medicine

## 2020-10-22 DIAGNOSIS — Z20822 Contact with and (suspected) exposure to covid-19: Secondary | ICD-10-CM | POA: Insufficient documentation

## 2020-10-22 DIAGNOSIS — Z79899 Other long term (current) drug therapy: Secondary | ICD-10-CM | POA: Insufficient documentation

## 2020-10-22 DIAGNOSIS — R42 Dizziness and giddiness: Secondary | ICD-10-CM | POA: Insufficient documentation

## 2020-10-22 DIAGNOSIS — I10 Essential (primary) hypertension: Secondary | ICD-10-CM | POA: Insufficient documentation

## 2020-10-22 DIAGNOSIS — G43909 Migraine, unspecified, not intractable, without status migrainosus: Secondary | ICD-10-CM | POA: Diagnosis not present

## 2020-10-22 DIAGNOSIS — G43009 Migraine without aura, not intractable, without status migrainosus: Secondary | ICD-10-CM

## 2020-10-22 DIAGNOSIS — Z7982 Long term (current) use of aspirin: Secondary | ICD-10-CM | POA: Diagnosis not present

## 2020-10-22 DIAGNOSIS — M5021 Other cervical disc displacement,  high cervical region: Secondary | ICD-10-CM | POA: Diagnosis not present

## 2020-10-22 DIAGNOSIS — M50222 Other cervical disc displacement at C5-C6 level: Secondary | ICD-10-CM | POA: Diagnosis not present

## 2020-10-22 DIAGNOSIS — M4802 Spinal stenosis, cervical region: Secondary | ICD-10-CM | POA: Diagnosis not present

## 2020-10-22 DIAGNOSIS — R519 Headache, unspecified: Secondary | ICD-10-CM | POA: Diagnosis not present

## 2020-10-22 LAB — COMPREHENSIVE METABOLIC PANEL
ALT: 19 U/L (ref 0–44)
AST: 22 U/L (ref 15–41)
Albumin: 3.9 g/dL (ref 3.5–5.0)
Alkaline Phosphatase: 84 U/L (ref 38–126)
Anion gap: 9 (ref 5–15)
BUN: 16 mg/dL (ref 8–23)
CO2: 25 mmol/L (ref 22–32)
Calcium: 8.9 mg/dL (ref 8.9–10.3)
Chloride: 106 mmol/L (ref 98–111)
Creatinine, Ser: 0.99 mg/dL (ref 0.44–1.00)
GFR, Estimated: 57 mL/min — ABNORMAL LOW (ref >60)
Glucose, Bld: 130 mg/dL — ABNORMAL HIGH (ref 70–99)
Potassium: 4.1 mmol/L (ref 3.5–5.1)
Sodium: 140 mmol/L (ref 135–145)
Total Bilirubin: 0.5 mg/dL (ref 0.3–1.2)
Total Protein: 6.8 g/dL (ref 6.5–8.1)

## 2020-10-22 LAB — RAPID URINE DRUG SCREEN, HOSP PERFORMED
Amphetamines: NOT DETECTED
Barbiturates: NOT DETECTED
Benzodiazepines: NOT DETECTED
Cocaine: NOT DETECTED
Opiates: NOT DETECTED
Tetrahydrocannabinol: NOT DETECTED

## 2020-10-22 LAB — DIFFERENTIAL
Abs Immature Granulocytes: 0.03 10*3/uL (ref 0.00–0.07)
Basophils Absolute: 0 10*3/uL (ref 0.0–0.1)
Basophils Relative: 1 %
Eosinophils Absolute: 0.1 10*3/uL (ref 0.0–0.5)
Eosinophils Relative: 2 %
Immature Granulocytes: 1 %
Lymphocytes Relative: 24 %
Lymphs Abs: 1.2 10*3/uL (ref 0.7–4.0)
Monocytes Absolute: 0.4 10*3/uL (ref 0.1–1.0)
Monocytes Relative: 7 %
Neutro Abs: 3.4 10*3/uL (ref 1.7–7.7)
Neutrophils Relative %: 65 %

## 2020-10-22 LAB — CBC
HCT: 41.3 % (ref 36.0–46.0)
Hemoglobin: 13.3 g/dL (ref 12.0–15.0)
MCH: 29.6 pg (ref 26.0–34.0)
MCHC: 32.2 g/dL (ref 30.0–36.0)
MCV: 91.8 fL (ref 80.0–100.0)
Platelets: 193 10*3/uL (ref 150–400)
RBC: 4.5 MIL/uL (ref 3.87–5.11)
RDW: 13.5 % (ref 11.5–15.5)
WBC: 5.2 10*3/uL (ref 4.0–10.5)
nRBC: 0 % (ref 0.0–0.2)

## 2020-10-22 LAB — URINALYSIS, ROUTINE W REFLEX MICROSCOPIC
Bacteria, UA: NONE SEEN
Bilirubin Urine: NEGATIVE
Glucose, UA: NEGATIVE mg/dL
Hgb urine dipstick: NEGATIVE
Ketones, ur: NEGATIVE mg/dL
Nitrite: NEGATIVE
Protein, ur: NEGATIVE mg/dL
Specific Gravity, Urine: 1.01 (ref 1.005–1.030)
pH: 5 (ref 5.0–8.0)

## 2020-10-22 LAB — RESP PANEL BY RT-PCR (FLU A&B, COVID) ARPGX2
Influenza A by PCR: NEGATIVE
Influenza B by PCR: NEGATIVE
SARS Coronavirus 2 by RT PCR: NEGATIVE

## 2020-10-22 LAB — SEDIMENTATION RATE: Sed Rate: 9 mm/hr (ref 0–22)

## 2020-10-22 LAB — APTT: aPTT: 28 seconds (ref 24–36)

## 2020-10-22 LAB — PROTIME-INR
INR: 1.1 (ref 0.8–1.2)
Prothrombin Time: 13.7 seconds (ref 11.4–15.2)

## 2020-10-22 LAB — ETHANOL: Alcohol, Ethyl (B): <10 mg/dL (ref <10)

## 2020-10-22 IMAGING — CT CT HEAD W/O CM
3 series · 16 of 47 positions shown, 19 images · non-contrast
Comparison: [DATE]

CLINICAL DATA: Dizziness, blurred vision, headache

EXAM:
CT HEAD WITHOUT CONTRAST
TECHNIQUE: Contiguous axial images were obtained from the base of the skull
through the vertex without intravenous contrast.

[Series 2: head w o · axial · 0.45mm/px · z∈[+1066,+1191]mm · 10 of 31 slices shown, 13 images]
[im 3/31  brain]
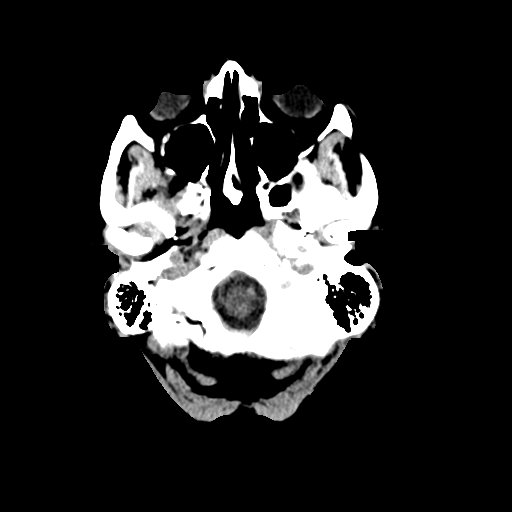
[im 3/31  bone]
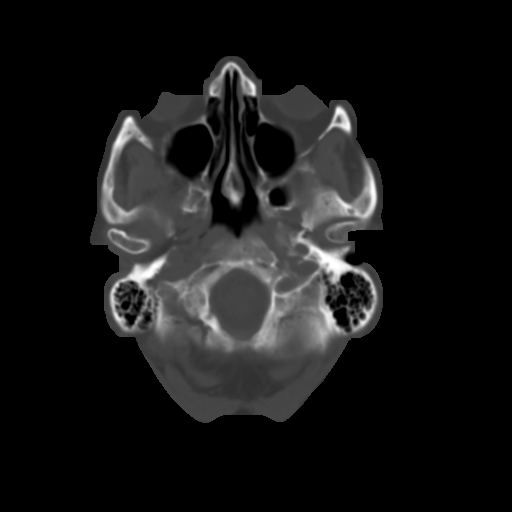
[im 6/31  brain]
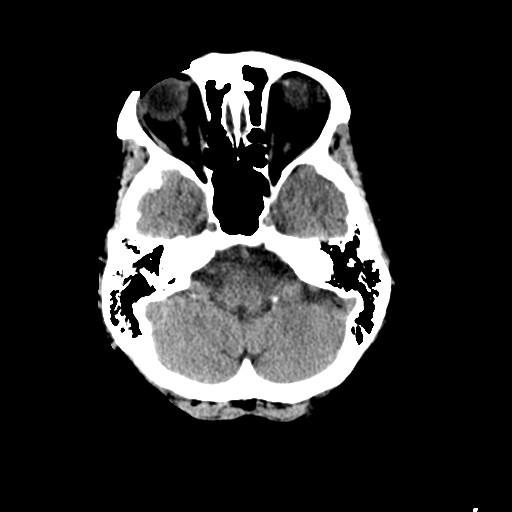
[im 9/31  brain]
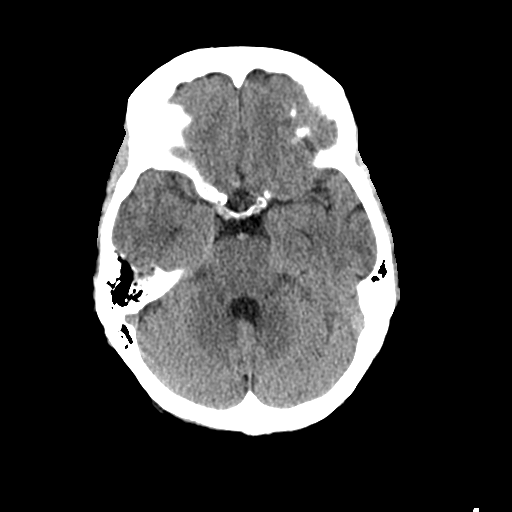
[im 11/31  brain]
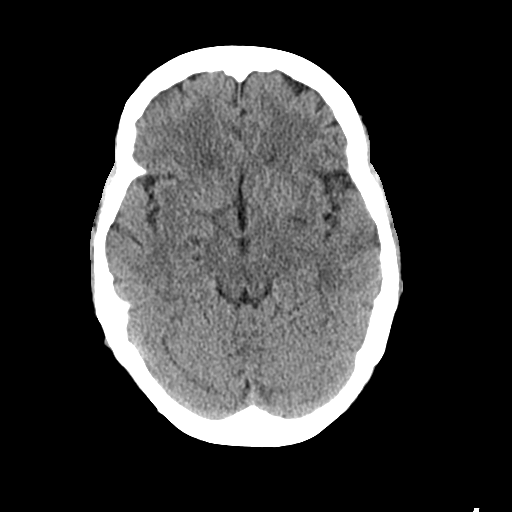
[im 14/31  brain]
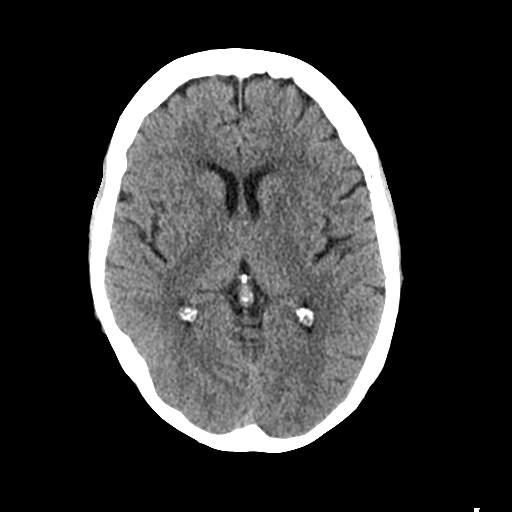
[im 14/31  bone]
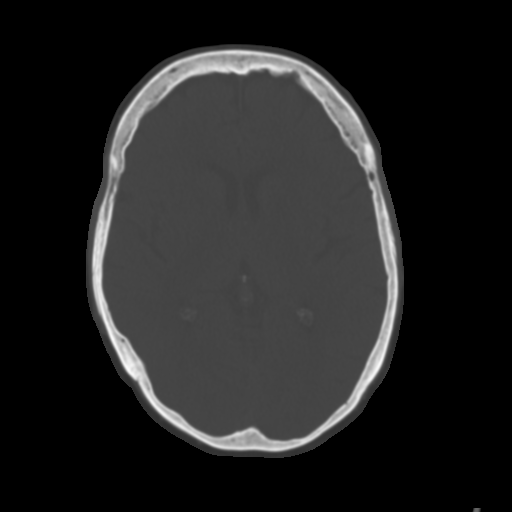
[im 17/31  brain]
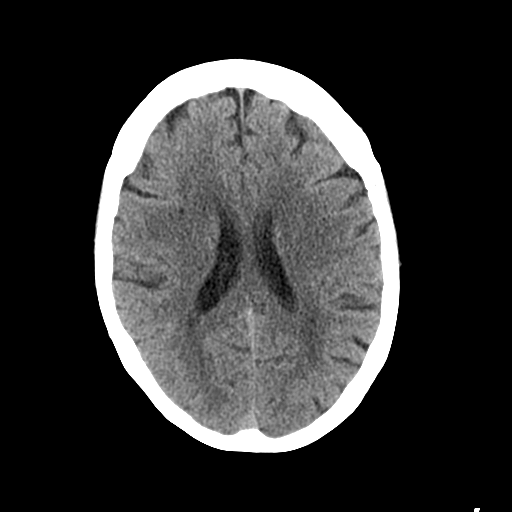
[im 20/31  brain]
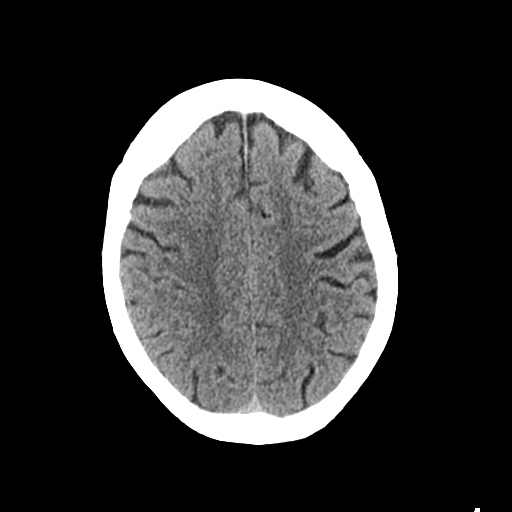
[im 23/31  brain]
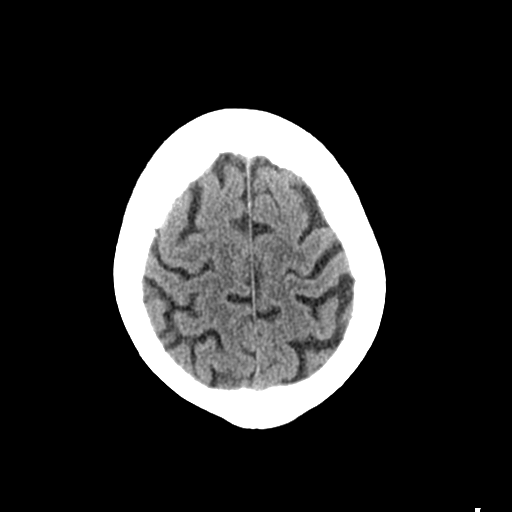
[im 25/31  brain]
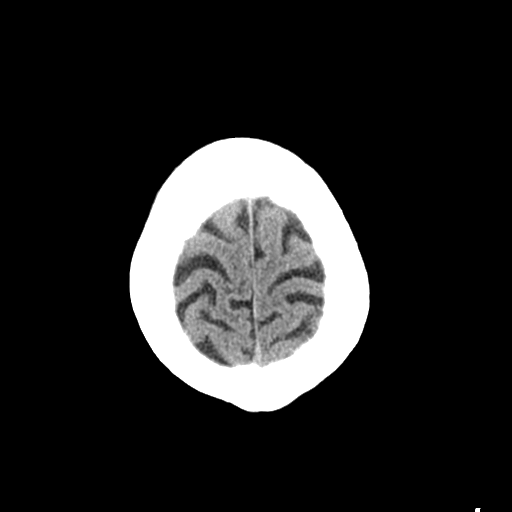
[im 25/31  bone]
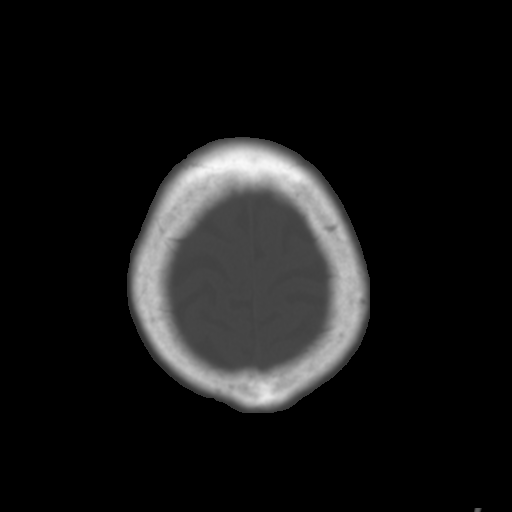
[im 28/31  brain]
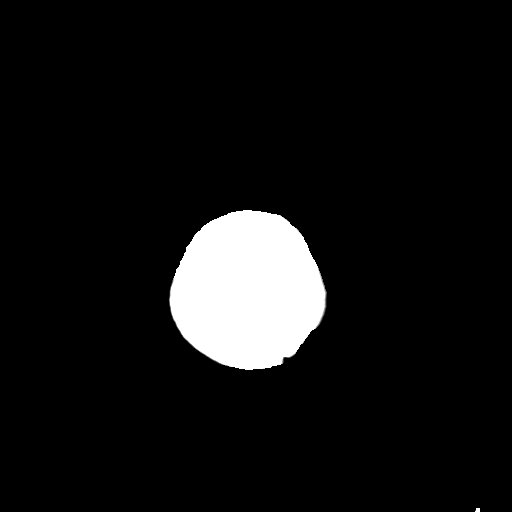

[Series 4: coronal soft · coronal · 0.33mm/px · 3 of 67 slices shown]
[im 23/67  brain]
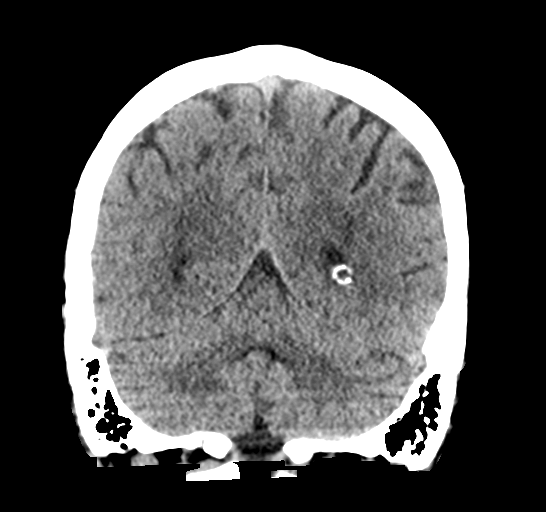
[im 30/67  brain]
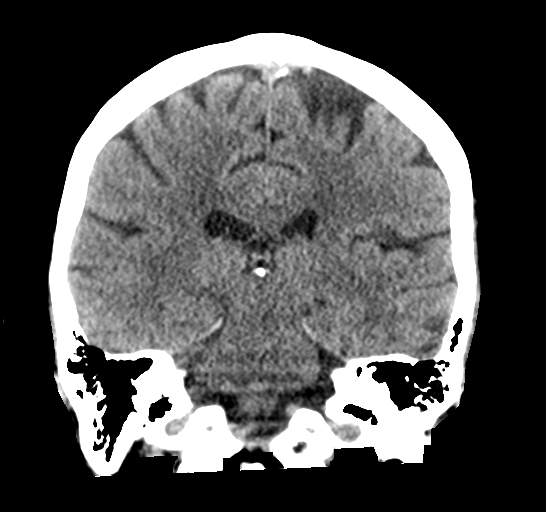
[im 37/67  brain]
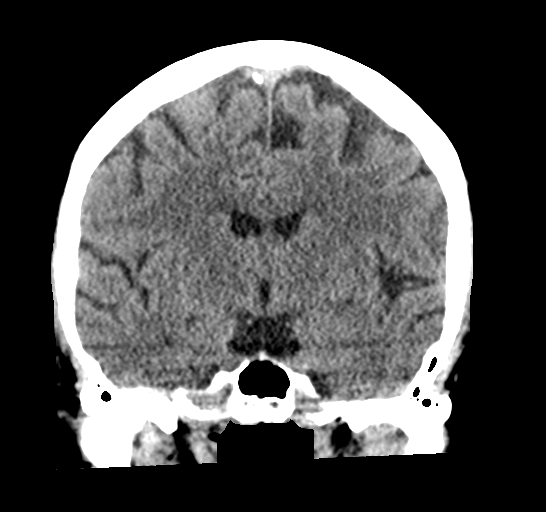

[Series 5: sagittal soft · sagittal · 0.34mm/px · 3 of 59 slices shown]
[im 20/59  brain]
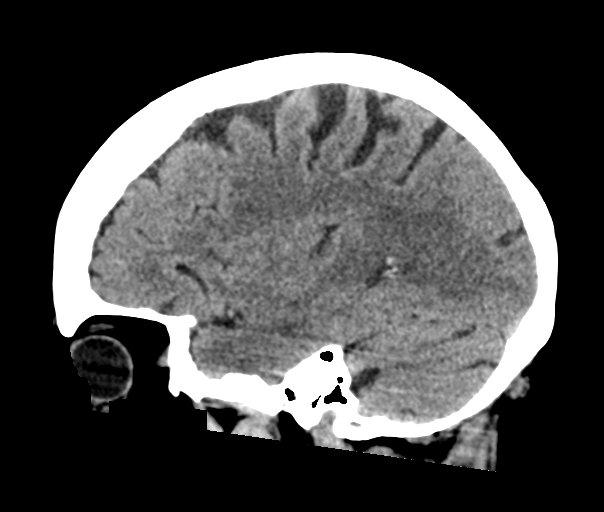
[im 30/59  brain]
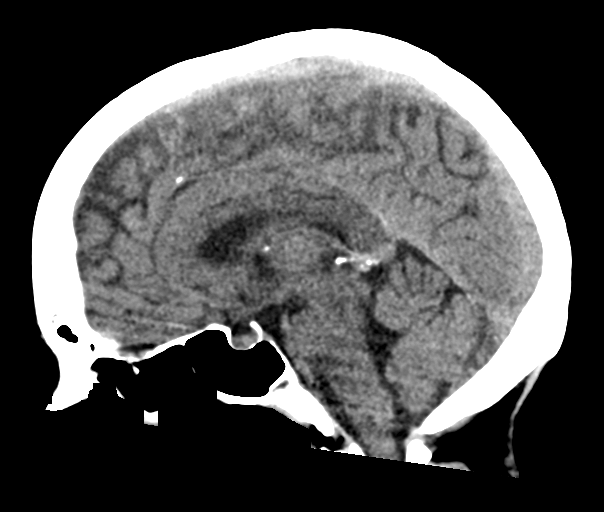
[im 39/59  brain]
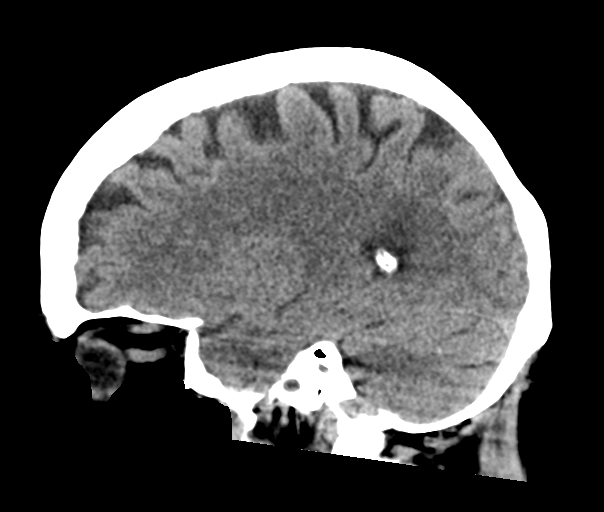

[16 of 47 positions shown; findings below may reference images not displayed]

FINDINGS: Brain: No evidence of acute infarction, hemorrhage, hydrocephalus,
extra-axial collection or mass lesion/mass effect. Scattered
low-density changes within the periventricular and subcortical white
matter compatible with chronic microvascular ischemic change. Mild
diffuse cerebral volume loss.

Vascular: Atherosclerotic calcifications involving the large vessels
of the skull base. No unexpected hyperdense vessel.

Skull: Normal. Negative for fracture or focal lesion.

Sinuses/Orbits: No acute finding.

Other: None.
IMPRESSION: 1. No acute intracranial findings.
2. Mild chronic microvascular ischemic change and cerebral volume
loss.

## 2020-10-22 IMAGING — MR MR HEAD WO/W CM
12 of 14 series · 38 of 48 positions shown · IV contrast (6 ml Gadavist)
Comparison: CT head without contrast [DATE]

CLINICAL DATA: Dizziness.

EXAM:
MRI HEAD WITHOUT AND WITH CONTRAST
TECHNIQUE: Multiplanar, multiecho pulse sequences of the brain and surrounding
structures were obtained without and with intravenous contrast.
CONTRAST:  6mL GADAVIST GADOBUTROL 1 MMOL/ML IV SOLN

[Series 5: DWI · coronal · 5.0mm · 0.88mm/px · 3 of 30 slices shown (1 of 6)]
[im 1/30]
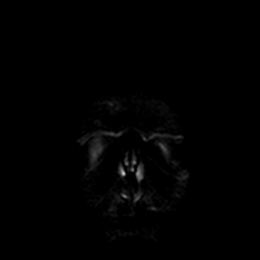
[im 15/30]
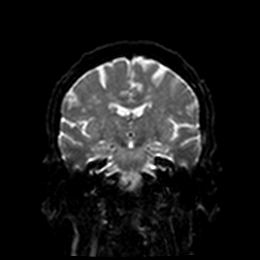
[im 30/30]
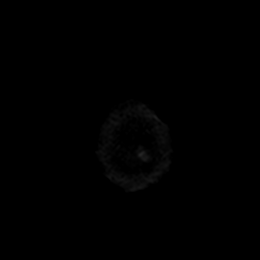

[Series 5: DWI · coronal · 5.0mm · 0.88mm/px · 3 of 30 slices shown (2 of 6)]
[im 1/30]
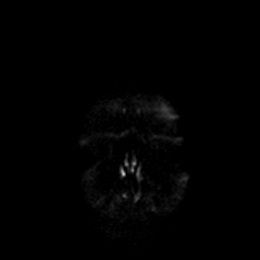
[im 15/30]
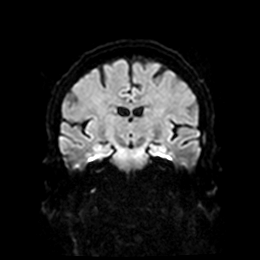
[im 30/30]
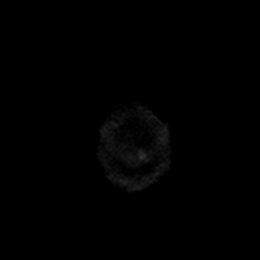

[Series 6: DWI · coronal · 5.0mm · 0.88mm/px · 3 of 30 slices shown (3 of 6)]
[im 1/30]
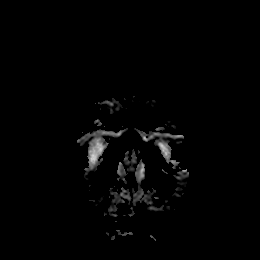
[im 15/30]
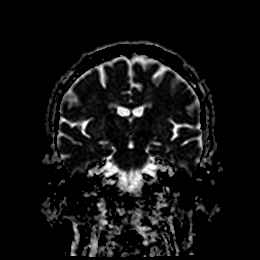
[im 30/30]
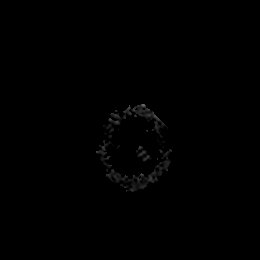

[Series 7: DWI · axial · 4.0mm · 0.85mm/px · z∈[-100,+40]mm · 4 of 36 slices shown (4 of 6)]
[im 1/36]
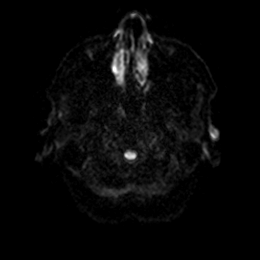
[im 12/36]
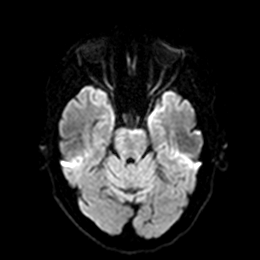
[im 24/36]
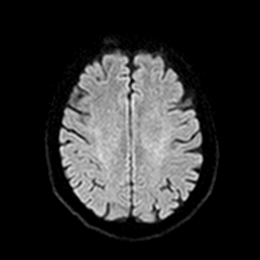
[im 36/36]
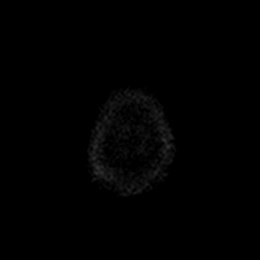

[Series 7: DWI · axial · 4.0mm · 0.85mm/px · z∈[-100,+40]mm · 4 of 36 slices shown (5 of 6)]
[im 1/36]
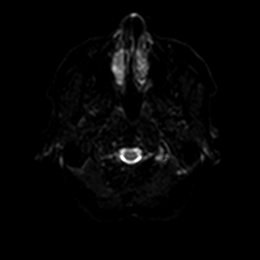
[im 12/36]
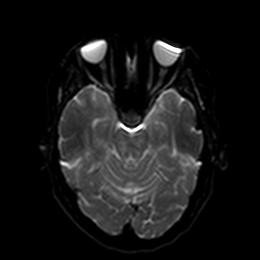
[im 24/36]
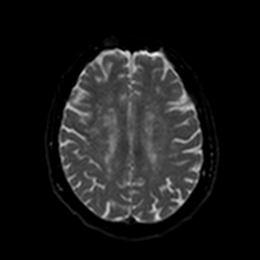
[im 36/36]
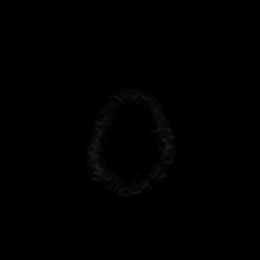

[Series 8: DWI · axial · 4.0mm · 0.85mm/px · z∈[-100,+40]mm · 4 of 36 slices shown (6 of 6)]
[im 1/36]
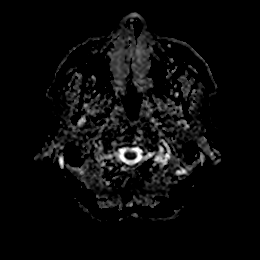
[im 12/36]
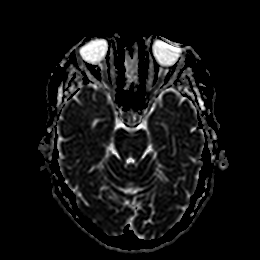
[im 24/36]
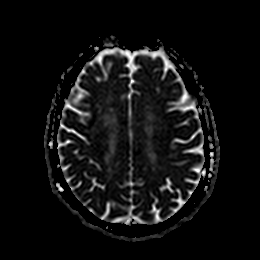
[im 36/36]
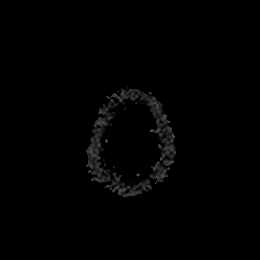

[Series 9: T1 · sagittal · 5.0mm · 0.94mm/px · 2 of 21 slices shown]
[im 1/21]
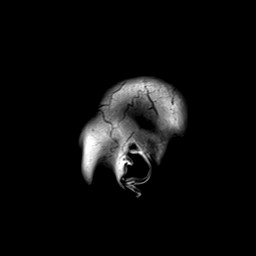
[im 21/21]
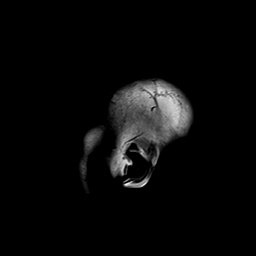

[Series 10: T2 · axial · 5.0mm · 0.72mm/px · z∈[-96,+37]mm · 2 of 20 slices shown (1 of 2)]
[im 1/20]
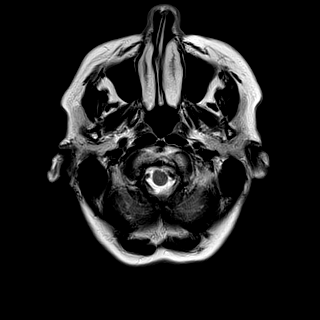
[im 20/20]
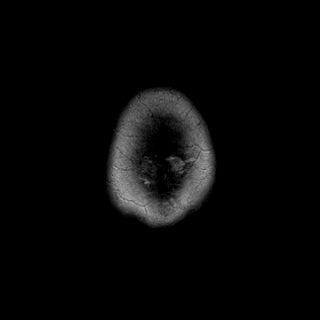

[Series 11: ax hemo · axial · 5.0mm · 0.86mm/px · z∈[-101,+42]mm · 3 of 25 slices shown]
[im 1/25]
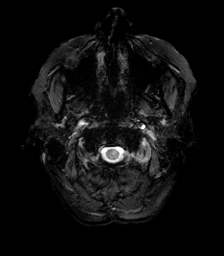
[im 13/25]
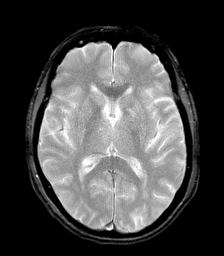
[im 25/25]
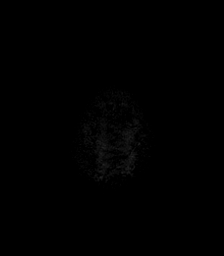

[Series 12: FLAIR · axial · 4.0mm · 0.43mm/px · z∈[-99,+40]mm · 4 of 36 slices shown]
[im 1/36]
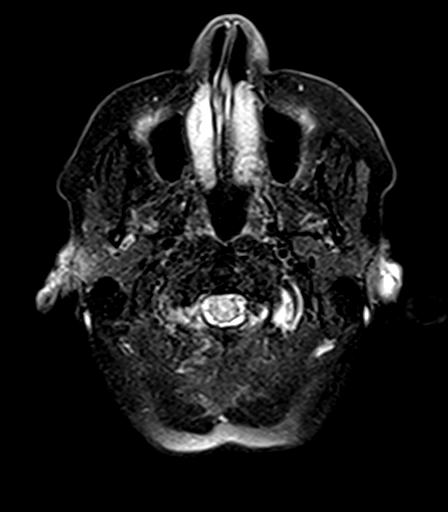
[im 12/36]
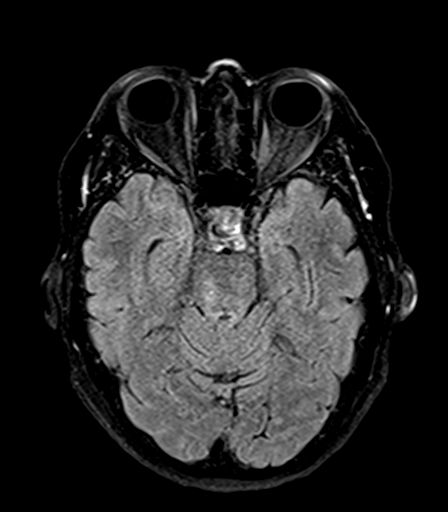
[im 24/36]
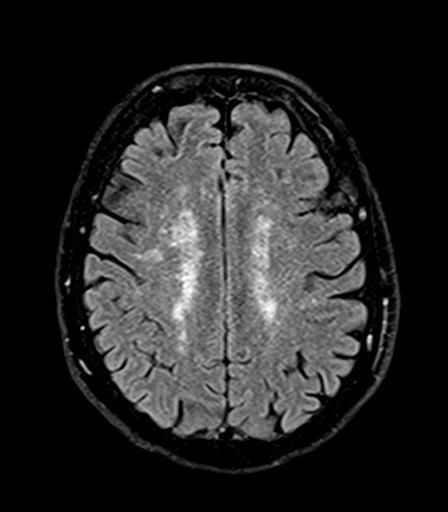
[im 36/36]
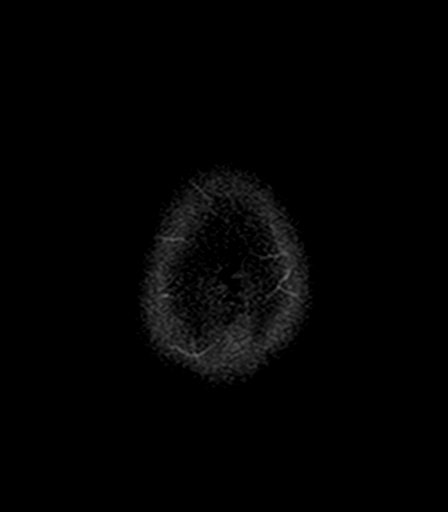

[Series 14: T2 · coronal · 5.0mm · 0.72mm/px · 3 of 28 slices shown (2 of 2)]
[im 1/28]
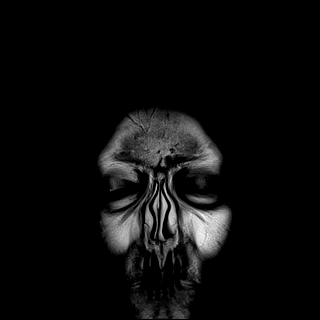
[im 14/28]
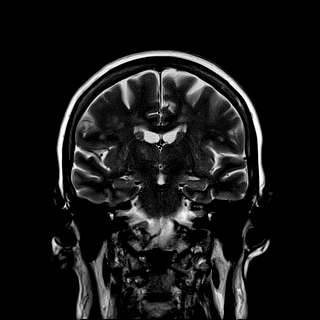
[im 28/28]
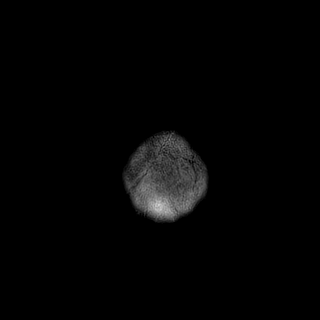

[Series 16: T1 post-contrast · coronal · 5.0mm · 0.34mm/px · 3 of 31 slices shown]
[im 1/31]
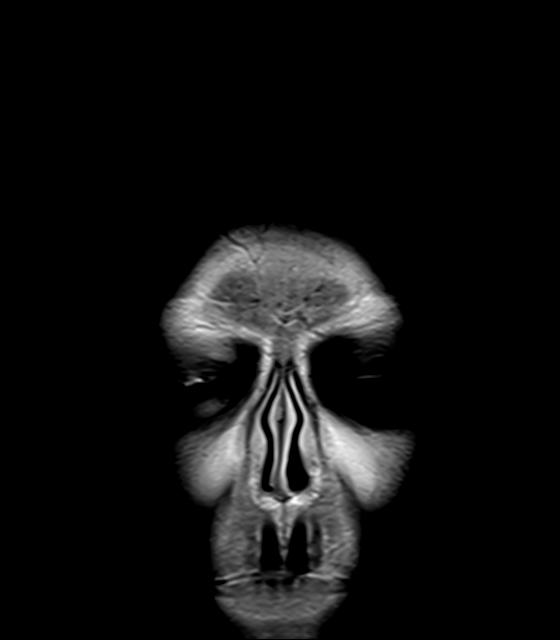
[im 16/31]
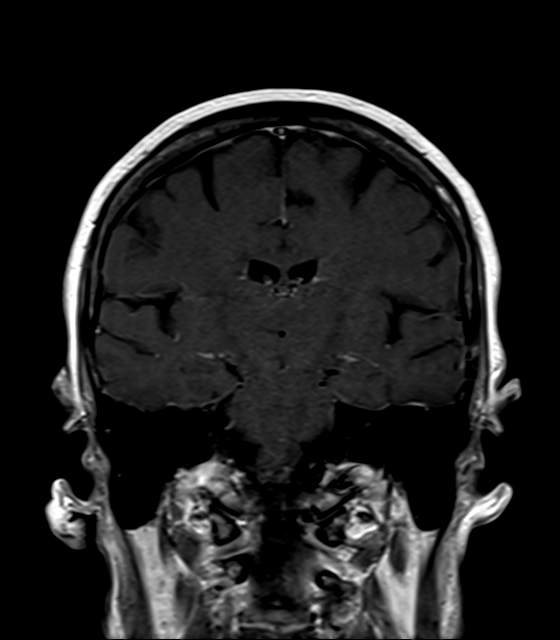
[im 31/31]
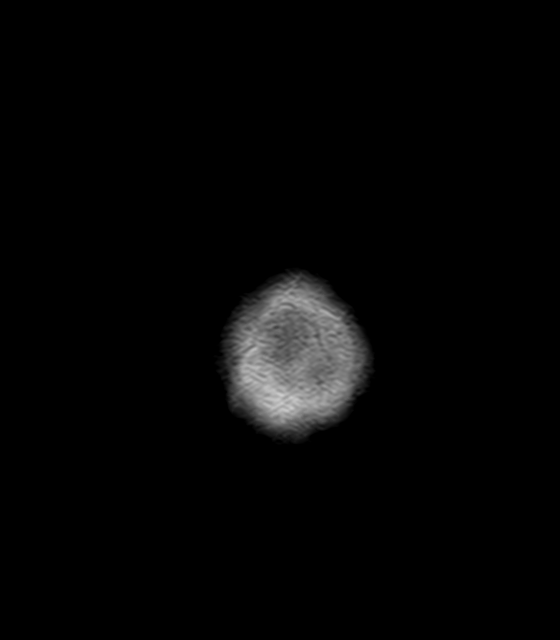

[38 of 48 positions shown; findings below may reference images not displayed]

FINDINGS: Brain: Moderate periventricular and subcortical T2 hyperintensities
are present bilaterally. No acute infarct, hemorrhage, or mass
lesion is present. The ventricles are of normal size. No significant
extraaxial fluid collection is present.

Dilated perivascular spaces are present in the basal ganglia. White
matter changes extend into the brainstem.

The internal auditory canals are within normal limits. Cerebellum is
unremarkable.

Vascular: Flow is present in the major intracranial arteries.

Skull and upper cervical spine: Craniocervical junction is normal.
Upper cervical disc disease results in central canal stenosis at
C3-4. Marrow signal is otherwise normal.

Sinuses/Orbits: The paranasal sinuses and mastoid air cells are
clear. Bilateral lens replacements are noted. Globes and orbits are
otherwise unremarkable.
IMPRESSION: 1. No acute intracranial abnormality.
2. Moderate periventricular and subcortical T2 hyperintensities
bilaterally are moderately advanced for age. The finding is
nonspecific but can be seen in the setting of chronic microvascular
ischemia, a demyelinating process such as multiple sclerosis,
vasculitis, complicated migraine headaches, or as the sequelae of a
prior infectious or inflammatory process.
3. Cervical disc disease with central canal narrowing at C3-4.

## 2020-10-22 IMAGING — MR MR CERVICAL SPINE WO/W CM
6 of 8 series · 30 of 48 positions shown · IV contrast (6 ml Gadavist)
Comparison: None.

CLINICAL DATA: Recurrent dizziness.

EXAM:
MRI CERVICAL SPINE WITHOUT AND WITH CONTRAST
TECHNIQUE: Multiplanar and multiecho pulse sequences of the cervical spine, to
include the craniocervical junction and cervicothoracic junction,
were obtained without and with intravenous contrast.
CONTRAST:  6mL GADAVIST GADOBUTROL 1 MMOL/ML IV SOLN

[Series 9: T1 · sagittal · 3.0mm · 0.86mm/px · 3 of 15 slices shown (1 of 2)]
[im 1/15]
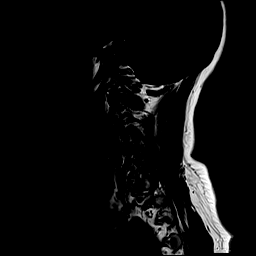
[im 8/15]
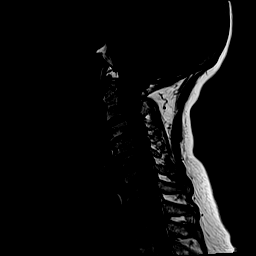
[im 15/15]
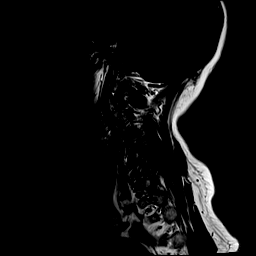

[Series 10: STIR · sagittal · 3.0mm · 0.86mm/px · 3 of 15 slices shown]
[im 1/15]
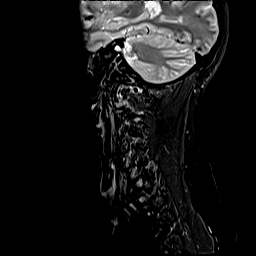
[im 8/15]
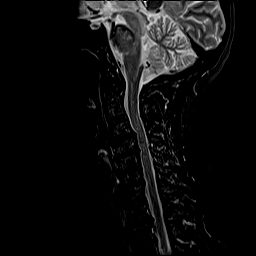
[im 15/15]
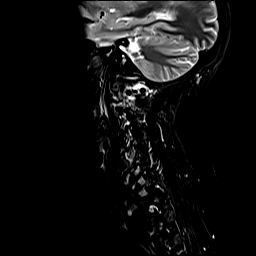

[Series 11: T2 · axial · 3.0mm · 0.70mm/px · z∈[-242,-126]mm · 9 of 38 slices shown (1 of 2)]
[im 1/38]
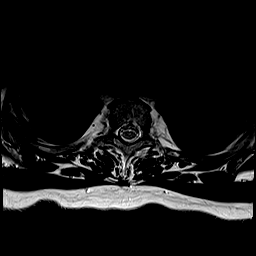
[im 5/38]
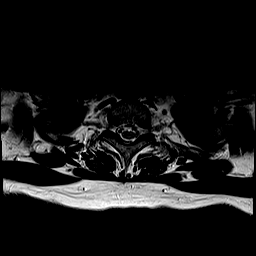
[im 10/38]
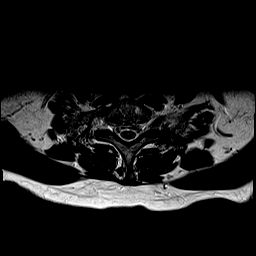
[im 14/38]
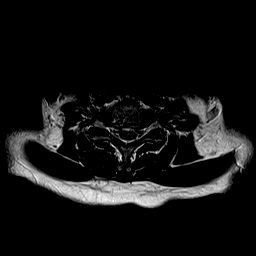
[im 19/38]
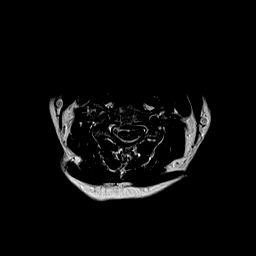
[im 24/38]
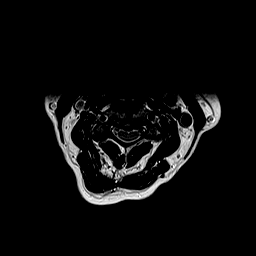
[im 28/38]
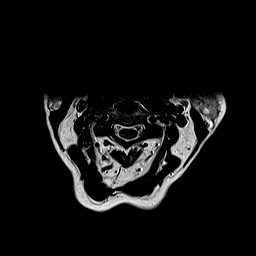
[im 33/38]
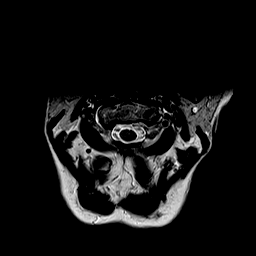
[im 38/38]
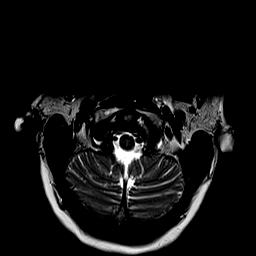

[Series 13: T1 · axial · non-contrast · 3.0mm · 0.35mm/px · z∈[-239,-129]mm · 9 of 38 slices shown (2 of 2)]
[im 1/38]
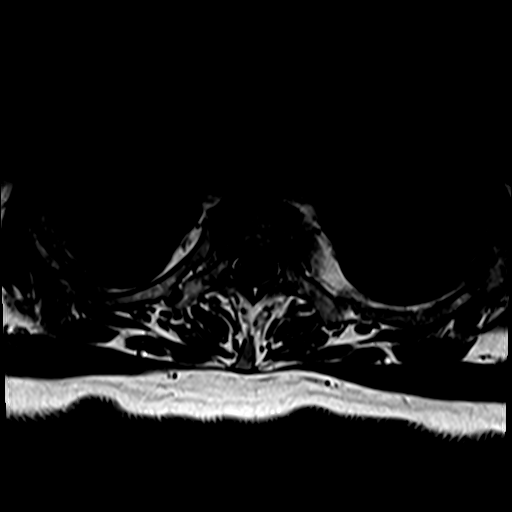
[im 5/38]
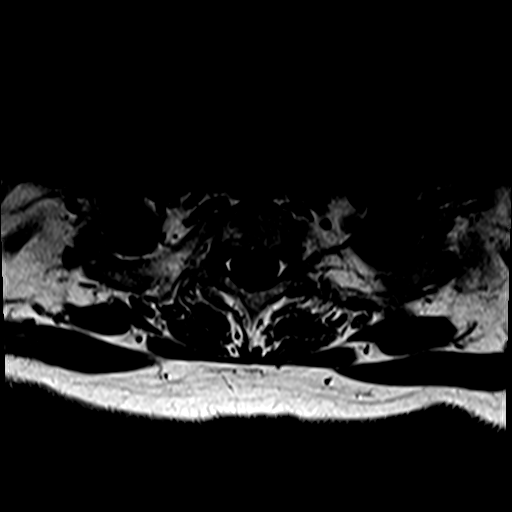
[im 10/38]
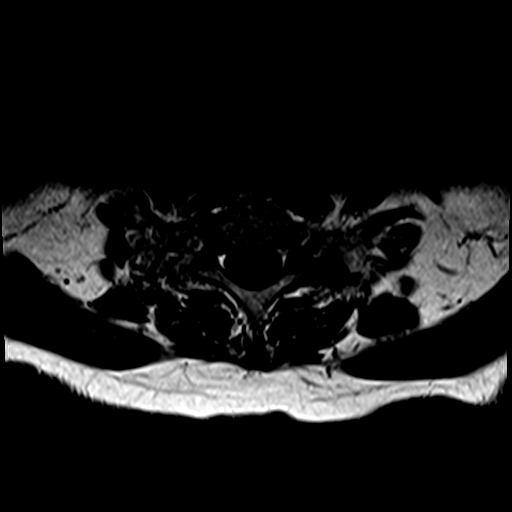
[im 14/38]
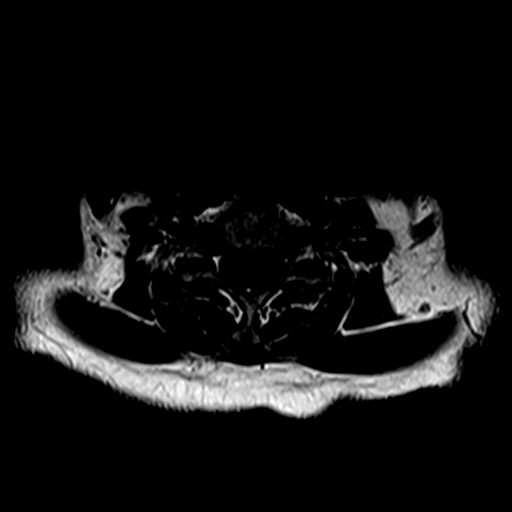
[im 19/38]
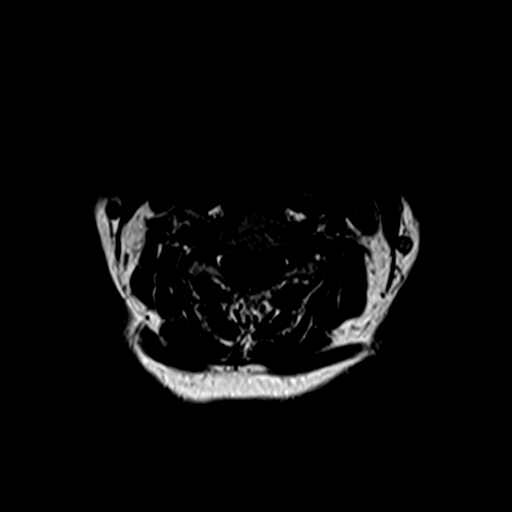
[im 24/38]
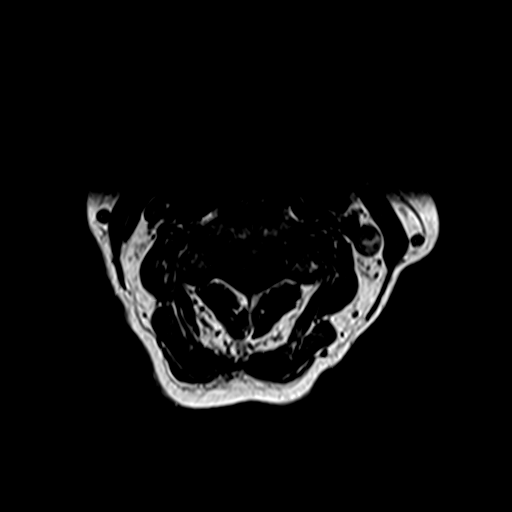
[im 28/38]
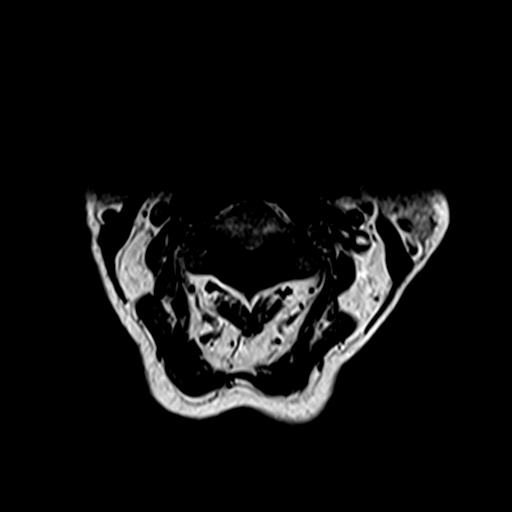
[im 33/38]
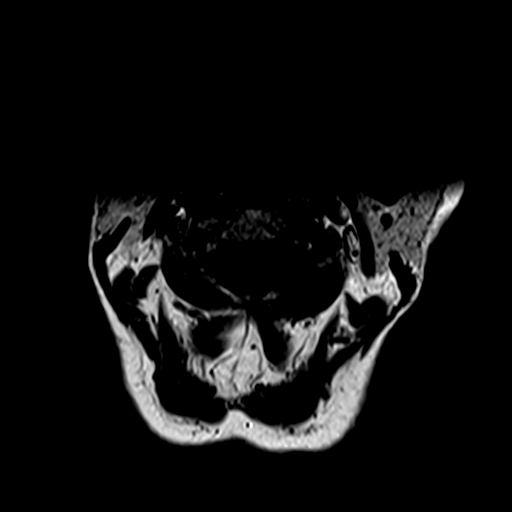
[im 38/38]
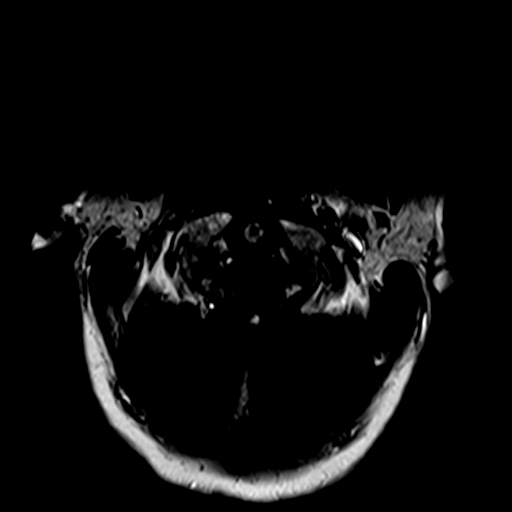

[Series 14: T2 · sagittal · 3.0mm · 0.69mm/px · 3 of 15 slices shown (2 of 2)]
[im 1/15]
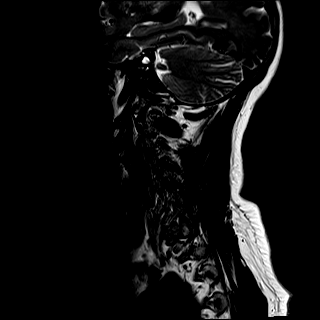
[im 8/15]
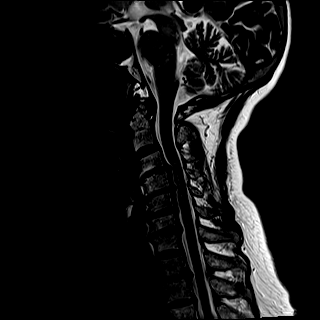
[im 15/15]
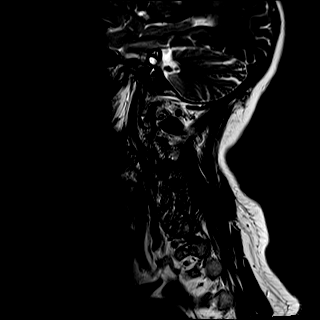

[Series 15: T1 post-contrast · sagittal · 3.0mm · 0.43mm/px · 3 of 15 slices shown]
[im 1/15]
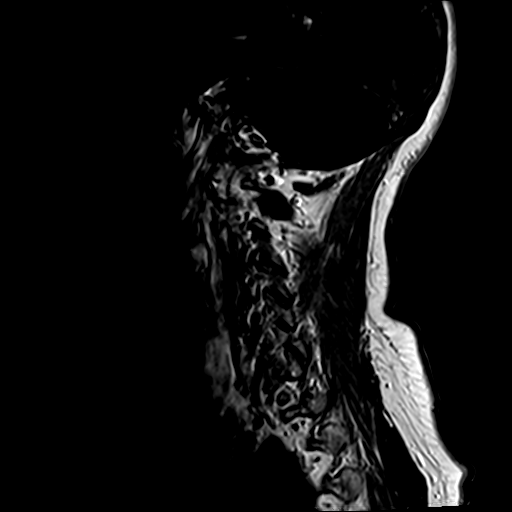
[im 8/15]
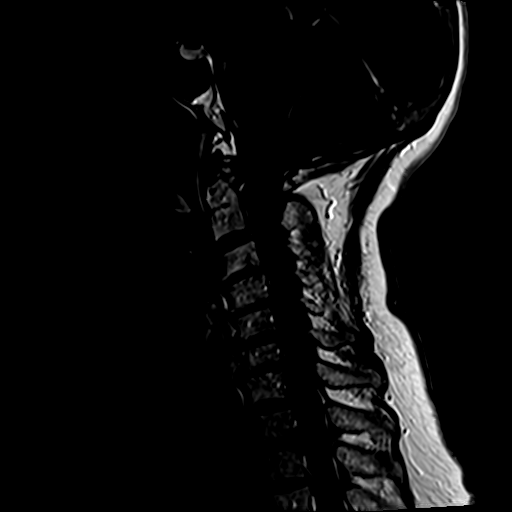
[im 15/15]
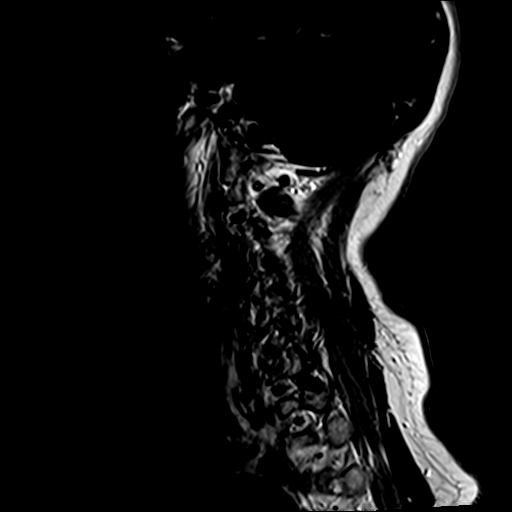

[30 of 48 positions shown; findings below may reference images not displayed]

FINDINGS: Alignment: Slight degenerative anterolisthesis is present at C2-3.
No other significant listhesis is present. There is straightening of
the normal cervical lordosis.

Vertebrae: Chronic endplate marrow changes are most pronounced at
C3-4. More mild changes are present at C4-5 are C5-6, and C6-7. No
focal lesions are evident. No pathologic enhancement is present.

Cord: Normal signal and morphology is present in the cervical spinal
cord to the lowest imaged level, T2-3. No pathologic enhancement is
present.

Posterior Fossa, vertebral arteries, paraspinal tissues:
Craniocervical junction is normal. Moderate degenerative changes are
noted at C1-2. Flow is present in the vertebral arteries
bilaterally. Left vertebral artery is dominant.

Disc levels:

C2-3: Negative.

C3-4: A central disc protrusion contacts and slightly distorts the
ventral surface of the cord. No abnormal signal is present.
Uncovertebral spurring leads to moderate right and mild left
foraminal stenosis.

C4-5: A broad-based disc osteophyte complex effaces the ventral CSF.
Moderate foraminal narrowing is worse on the right.

C5-6: Mild disc bulging and uncovertebral spurring is present.
Central canal is patent. Moderate right and mild left foraminal
narrowing is present.

C6-7: A broad-based disc osteophyte complex partially effaces the
ventral CSF. Moderate right mild left foraminal narrowing is
present.

C7-T1: Asymmetric left-sided facet hypertrophy is present. No
significant disc protrusion or stenosis is present.
IMPRESSION: 1. Moderate central canal stenosis at C3-4 without abnormal cord
signal.
2. Mild central canal narrowing at C4-5 and C6-7.
3. Moderate right and mild left foraminal stenosis at C3-4, C4-5,
and C6-7.
4. Moderate foraminal narrowing bilaterally at C4-5 is worse on the
right.
5. Moderate right and mild left foraminal narrowing at C5-6 and
C6-7.
6. No pathologic enhancement.

## 2020-10-22 MED ORDER — GADOBUTROL 1 MMOL/ML IV SOLN
6.0000 mL | Freq: Once | INTRAVENOUS | Status: AC | PRN
  Administered 2020-10-22: 6 mL via INTRAVENOUS

## 2020-10-22 MED ORDER — MECLIZINE HCL 25 MG PO TABS: 25.0000 mg | ORAL_TABLET | Freq: Three times a day (TID) | ORAL | 0 refills | Status: DC | PRN

## 2020-10-22 NOTE — ED Provider Notes (Signed)
MSE was initiated and I personally evaluated the patient and placed orders (if any) at  2:04 PM on October 22, 2020.   Pt presents to the ED today with dizziness.  The pt has had periodic spells of dizziness.  PCP did a head CT on 3/31 for the same.  Pt feels off balance when she walks.  She is moving everything well.  No aphasia.  Pt feels waves of dots going over her eyes when she is dizzy in all visual fields.  No visual field deficits.  This is not a code stroke; however, stroke orders placed.    The patient appears stable so that the remainder of the MSE may be completed by another provider.   Isla Pence, MD 10/22/20 1407

## 2020-10-22 NOTE — Discharge Instructions (Addendum)
Take the medication as directed at first sign of dizziness and/or headache along with 1 extra strength Tylenol.  As discussed, call your primary care provider on Monday to arrange a follow-up appointment.  I have also listed the information for the local neurologist so that you may arrange follow-up.  Please return to the emergency department if you develop any new or worsening symptoms.

## 2020-10-22 NOTE — ED Triage Notes (Signed)
Pt presents to ED with complaints of onset of sudden dizziness, blurred vision, and headache started this morning at 0945. LKW [id number]. Dr Gilford Raid in triage to see pt. Pt states she feels off balance with walking.

## 2020-10-22 NOTE — ED Notes (Signed)
Dr Gilford Raid at bedside prior to triage complete.

## 2020-10-22 NOTE — ED Notes (Signed)
Pt going to MRI

## 2020-10-24 LAB — URINE CULTURE

## 2020-10-31 DIAGNOSIS — I10 Essential (primary) hypertension: Secondary | ICD-10-CM | POA: Diagnosis not present

## 2020-10-31 DIAGNOSIS — K219 Gastro-esophageal reflux disease without esophagitis: Secondary | ICD-10-CM | POA: Diagnosis not present

## 2020-10-31 DIAGNOSIS — U071 COVID-19: Secondary | ICD-10-CM | POA: Diagnosis not present

## 2020-10-31 DIAGNOSIS — J029 Acute pharyngitis, unspecified: Secondary | ICD-10-CM | POA: Diagnosis not present

## 2020-11-03 DIAGNOSIS — U071 COVID-19: Secondary | ICD-10-CM | POA: Diagnosis not present

## 2020-11-03 DIAGNOSIS — J029 Acute pharyngitis, unspecified: Secondary | ICD-10-CM | POA: Diagnosis not present

## 2020-11-09 NOTE — ED Provider Notes (Signed)
[facility] Provider Note   CSN: [id number] Arrival date & time: 10/22/20  1320     History Chief Complaint  Patient presents with  . Dizziness    Annette Briggs is a 80 y.o. female.  HPI      Annette Briggs is a 80 y.o. female with past medical history of Crohn's, anxiety, hypertension and GERD who presents to the Emergency Department complaining of sudden onset of dizziness blurred vision and headache that started at 945 on the morning of arrival.  She describes feeling "off balance" with walking and a sense of movement with position change.  She describes the headache as throbbing sensation across her forehead into both temples.  Some nausea, she denies vomiting, abdominal pain or diarrhea, chest pain or shortness of breath.  No recent illness, fever or chills.    Past Medical History:  Diagnosis Date  . Anxiety   . Crohn's colitis ([place])   . Depression   . Essential hypertension   . GERD (gastroesophageal reflux disease)     Patient Active Problem List   Diagnosis Date Noted  . Crohn's disease of both small and large intestine ([place]) 05/13/2020  . Angina pectoris ([place]) 02/02/2020  . GERD (gastroesophageal reflux disease) 07/22/2013  . Essential hypertension, benign 04/21/2013  . Depression 04/21/2013  . Crohn disease ([place]) 02/05/2013    Past Surgical History:  Procedure Laterality Date  . APPENDECTOMY    . BLADDER SUSPENSION    . CHOLECYSTECTOMY    . COLONOSCOPY     2008  . COLONOSCOPY N/A 02/13/2013   Procedure: COLONOSCOPY;  Surgeon: [name], MD;  Location: [facility];  Service: Endoscopy;  Laterality: N/A;  730  . COLONOSCOPY N/A 01/15/2019   Procedure: COLONOSCOPY;  Surgeon: [name], MD;  Location: [facility];  Service: Endoscopy;  Laterality: N/A;  100  . ESOPHAGOGASTRODUODENOSCOPY N/A 03/18/2020   Procedure: ESOPHAGOGASTRODUODENOSCOPY (EGD);  Surgeon: [name], MD;  Location: [facility];  Service:  Endoscopy;  Laterality: N/A;  225, moved up per office  . LEFT HEART CATH AND CORONARY ANGIOGRAPHY N/A 02/02/2020   Procedure: LEFT HEART CATH AND CORONARY ANGIOGRAPHY;  Surgeon: Sherren Mocha, MD;  Location: [facility];  Service: Cardiovascular;  Laterality: N/A;  . TOTAL ABDOMINAL HYSTERECTOMY       OB History   No obstetric history on file.     Family History  Problem Relation Age of Onset  . Heart failure Mother   . Kidney failure Father   . Breast cancer Sister   . Alzheimer's disease Sister     Social History   Tobacco Use  . Smoking status: Never Smoker  . Smokeless tobacco: Never Used  Vaping Use  . Vaping Use: Never used  Substance Use Topics  . Alcohol use: No    Alcohol/week: 0.0 standard drinks  . Drug use: No    Home Medications Prior to Admission medications   Medication Sig Start Date End Date Taking? Authorizing Provider  acetaminophen (TYLENOL) 500 MG tablet Take 500-1,000 mg by mouth every 6 (six) hours as needed for moderate pain or headache.    Yes [provider]  aspirin EC 81 MG tablet Take 81 mg by mouth daily. Swallow whole.   Yes [provider]  atorvastatin (LIPITOR) 10 MG tablet Take 10 mg by mouth at bedtime.   Yes [provider]  Cholecalciferol (DIALYVITE VITAMIN D 5000) 125 MCG (5000 UT) capsule Take  5,000 Units by mouth daily.   Yes [provider]  famotidine (PEPCID) 20 MG tablet Take 1 tablet (20 mg total) by mouth at bedtime. 03/18/20  Yes Rehman, Mechele Dawley, MD  meclizine (ANTIVERT) 25 MG tablet Take 1 tablet (25 mg total) by mouth 3 (three) times daily as needed for dizziness. May cause drowsiness 10/22/20  Yes Madilynne Mullan, PA-C  mesalamine (PENTASA) 500 MG CR capsule Take 2 capsules (1,000 mg total) by mouth 3 (three) times daily. Patient taking differently: Take 1,000-1,500 mg by mouth 3 (three) times daily. Take 1072m by mouth in the morning and 15037min the afternoon, 01/26/14  Yes  Rehman, NaMechele DawleyMD  metoprolol succinate (TOPROL-XL) 25 MG 24 hr tablet Take 12.5 mg by mouth at bedtime.   Yes [provider]  Misc Natural Products (OSTEO BI-FLEX ADV JOINT SHIELD PO) Take 1 tablet by mouth daily. Take 1/2 tablet   Yes [provider]  Multiple Vitamin (MULTIVITAMIN) tablet Take 1 tablet by mouth daily.   Yes [provider]  Multiple Vitamins-Minerals (AIRBORNE) CHEW Chew 1 tablet by mouth daily.   Yes [provider]  OVER THE COUNTER MEDICATION Take 1 tablet by mouth daily. cognitex elite otc supplement   Yes [provider]  venlafaxine XR (EFFEXOR-XR) 37.5 MG 24 hr capsule Take 37.5 mg by mouth daily.   Yes [provider]  bismuth subsalicylate (PEPTO BISMOL) 262 MG chewable tablet Chew 524 mg by mouth as needed. As needed for diarrhea Patient not taking: No sig reported    [provider]  budesonide (ENTOCORT EC) 3 MG 24 hr capsule Take 3 capsules (9 mg total) by mouth daily. Patient not taking: No sig reported 07/15/20   ReRogene HoustonMD  esomeprazole (NEXIUM) 40 MG capsule Take 1 capsule (40 mg total) by mouth daily before breakfast. Patient not taking: Reported on 10/22/2020 05/13/20 06/12/20  CaHarvel QualeMD    Allergies    Ciprofloxacin, Pneumovax [pneumococcal polysaccharide vaccine], and Wellbutrin [bupropion]  Review of Systems   Review of Systems  Constitutional: Negative for chills, fatigue and fever.  HENT: Negative for trouble swallowing.   Eyes: Positive for visual disturbance.  Respiratory: Negative for shortness of breath.   Cardiovascular: Negative for chest pain.  Gastrointestinal: Negative for abdominal pain, nausea and vomiting.  Genitourinary: Negative for dysuria, flank pain and hematuria.  Musculoskeletal: Negative for arthralgias, back pain, myalgias, neck pain and neck stiffness.  Skin: Negative for rash.  Neurological: Positive for dizziness,  light-headedness and headaches. Negative for syncope, speech difficulty, weakness and numbness.  Hematological: Does not bruise/bleed easily.  Psychiatric/Behavioral: Negative for confusion.    Physical Exam Updated Vital Signs BP (!) 172/71   Pulse 64   Temp 98.9 F (37.2 C) (Oral)   Resp 16   Ht 5' 2"  (1.575 m)   Wt 62.6 kg   SpO2 97%   BMI 25.24 kg/m   Physical Exam Vitals and nursing note reviewed.  Constitutional:      Appearance: Normal appearance.  HENT:     Head: Normocephalic.     Right Ear: Tympanic membrane and ear canal normal.     Left Ear: Tympanic membrane and ear canal normal.     Mouth/Throat:     Mouth: Mucous membranes are moist.  Eyes:     Extraocular Movements: Extraocular movements intact.     Conjunctiva/sclera: Conjunctivae normal.     Pupils: Pupils are equal, round, and reactive to light.  Neck:     Thyroid: No thyromegaly.     Meningeal: Kernig's sign absent.  Cardiovascular:     Rate and Rhythm: Normal rate and regular rhythm.     Pulses: Normal pulses.  Pulmonary:     Effort: Pulmonary effort is normal.     Breath sounds: Normal breath sounds. No wheezing.  Abdominal:     Palpations: Abdomen is soft.     Tenderness: There is no abdominal tenderness. There is no guarding or rebound.  Musculoskeletal:        General: Normal range of motion.     Cervical back: Normal range of motion and neck supple.     Right lower leg: No edema.     Left lower leg: No edema.  Skin:    General: Skin is warm.     Capillary Refill: Capillary refill takes less than 2 seconds.     Findings: No rash.  Neurological:     General: No focal deficit present.     Mental Status: She is alert and oriented to person, place, and time.     GCS: GCS eye subscore is 4. GCS verbal subscore is 5. GCS motor subscore is 6.     Sensory: Sensation is intact. No sensory deficit.     Motor: Motor function is intact. No weakness.     Coordination: Coordination is intact.      Comments: CN II-XII intact.  Speech clear.  No pronator drift.  nml finger nose testing.       ED Results / Procedures / Treatments   Labs (all labs ordered are listed, but only abnormal results are displayed) Labs Reviewed  URINE CULTURE - Abnormal; Notable for the following components:      Result Value   Culture MULTIPLE SPECIES PRESENT, SUGGEST RECOLLECTION (*)    All other components within normal limits  COMPREHENSIVE METABOLIC PANEL - Abnormal; Notable for the following components:   Glucose, Bld 130 (*)    GFR, Estimated 57 (*)    All other components within normal limits  URINALYSIS, ROUTINE W REFLEX MICROSCOPIC - Abnormal; Notable for the following components:   Color, Urine STRAW (*)    Leukocytes,Ua MODERATE (*)    All other components within normal limits  RESP PANEL BY RT-PCR (FLU A&B, COVID) ARPGX2  ETHANOL  PROTIME-INR  APTT  CBC  DIFFERENTIAL  RAPID URINE DRUG SCREEN, HOSP PERFORMED  SEDIMENTATION RATE    EKG EKG Interpretation  Date/Time:  Friday October 22 2020 14:39:36 EDT Ventricular Rate:  64 PR Interval:  196 QRS Duration: 95 QT Interval:  391 QTC Calculation: 404 R Axis:   21 Text Interpretation: Sinus rhythm Borderline ST depression, diffuse leads No significant change since last tracing Confirmed by Isla Pence [phone]) on 10/22/2020 3:17:23 PM   Radiology  CT HEAD WO CONTRAST  Result Date: 10/22/2020 CLINICAL DATA:  Dizziness, blurred vision, headache EXAM: CT HEAD WITHOUT CONTRAST TECHNIQUE: Contiguous axial images were obtained from the base of the skull through the vertex without intravenous contrast. COMPARISON:  09/30/2020 FINDINGS: Brain: No evidence of acute infarction, hemorrhage, hydrocephalus, extra-axial collection or mass lesion/mass effect. Scattered low-density changes within the periventricular and subcortical white matter compatible with chronic microvascular ischemic change. Mild diffuse cerebral volume loss. Vascular:  Atherosclerotic calcifications involving the large vessels of the skull base. No unexpected hyperdense vessel. Skull: Normal. Negative for fracture or focal lesion. Sinuses/Orbits: No acute finding. Other: None. IMPRESSION: 1. No acute intracranial findings. 2. Mild chronic microvascular ischemic  change and cerebral volume loss. Electronically Signed   By: Davina Poke D.O.   On: 10/22/2020 15:20   MR Brain W and Wo Contrast  Result Date: 10/22/2020 CLINICAL DATA:  Dizziness. EXAM: MRI HEAD WITHOUT AND WITH CONTRAST TECHNIQUE: Multiplanar, multiecho pulse sequences of the brain and surrounding structures were obtained without and with intravenous contrast. CONTRAST:  58m GADAVIST GADOBUTROL 1 MMOL/ML IV SOLN COMPARISON:  CT head without contrast 09/21/2020 FINDINGS: Brain: Moderate periventricular and subcortical T2 hyperintensities are present bilaterally. No acute infarct, hemorrhage, or mass lesion is present. The ventricles are of normal size. No significant extraaxial fluid collection is present. Dilated perivascular spaces are present in the basal ganglia. White matter changes extend into the brainstem. The internal auditory canals are within normal limits. Cerebellum is unremarkable. Vascular: Flow is present in the major intracranial arteries. Skull and upper cervical spine: Craniocervical junction is normal. Upper cervical disc disease results in central canal stenosis at C3-4. Marrow signal is otherwise normal. Sinuses/Orbits: The paranasal sinuses and mastoid air cells are clear. Bilateral lens replacements are noted. Globes and orbits are otherwise unremarkable. IMPRESSION: 1. No acute intracranial abnormality. 2. Moderate periventricular and subcortical T2 hyperintensities bilaterally are moderately advanced for age. The finding is nonspecific but can be seen in the setting of chronic microvascular ischemia, a demyelinating process such as multiple sclerosis, vasculitis, complicated migraine  headaches, or as the sequelae of a prior infectious or inflammatory process. 3. 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 Electronically Signed   By: CSan MorelleM.D.   On: 10/22/2020 17:41   MR Cervical Spine W or Wo Contrast  Result Date: 10/22/2020 CLINICAL DATA:  Recurrent dizziness. EXAM: MRI CERVICAL SPINE WITHOUT AND WITH CONTRAST TECHNIQUE: Multiplanar and multiecho pulse sequences of the cervical spine, to include the craniocervical junction and cervicothoracic junction, were obtained without and with intravenous contrast. CONTRAST:  634mGADAVIST GADOBUTROL 1 MMOL/ML IV SOLN COMPARISON:  None. FINDINGS: Alignment: Slight degenerative anterolisthesis is present at C2-3. No other significant listhesis is present. There is straightening of the normal cervical lordosis. Vertebrae: Chronic endplate marrow changes are most pronounced at C3-4. More mild changes are present at C4-5 are C5-6, and C6-7. No focal lesions are evident. No pathologic enhancement is present. Cord: Normal signal and morphology is present in the cervical spinal cord to the lowest imaged level, T2-3. No pathologic enhancement is present. Posterior Fossa, vertebral arteries, paraspinal tissues: Craniocervical junction is normal. Moderate degenerative changes are noted at C1-2. Flow is present in the vertebral arteries bilaterally. Left vertebral artery is dominant. Disc levels: C2-3: Negative. C3-4: A central disc protrusion contacts and slightly distorts the ventral surface of the cord. No abnormal signal is present. Uncovertebral spurring leads to moderate right and mild left foraminal stenosis. C4-5: A broad-based disc osteophyte complex effaces the ventral CSF. Moderate foraminal narrowing is worse on the right. C5-6: Mild disc bulging and uncovertebral spurring is present. Central canal is patent. Moderate right and mild left foraminal narrowing is present. C6-7: A broad-based disc osteophyte complex  partially effaces the ventral CSF. Moderate right mild left foraminal narrowing is present. C7-T1: Asymmetric left-sided facet hypertrophy is present. No significant disc protrusion or stenosis is present. IMPRESSION: 1. Moderate central canal stenosis at C3-4 without abnormal cord signal. 2. Mild central canal narrowing at C4-5 and C6-7. 3. Moderate right and mild left foraminal stenosis at C3-4, C4-5, and C6-7. 4. Moderate foraminal narrowing bilaterally at C4-5 is worse on the right. 5. Moderate  right and mild left foraminal narrowing at C5-6 and C6-7. 6. No pathologic enhancement. Electronically Signed   By: San Morelle M.D.   On: 10/22/2020 17:46     Procedures Procedures   Medications Ordered in ED Medications  gadobutrol (GADAVIST) 1 MMOL/ML injection 6 mL (6 mLs Intravenous Contrast Given 10/22/20 1736)    ED Course  I have reviewed the triage vital signs and the nursing notes.  Pertinent labs & imaging results that were available during my care of the patient were reviewed by me and considered in my medical decision making (see chart for details).    MDM Rules/Calculators/A&P                          Patient here with last known well 4 hours prior to arrival.  Complaint of dizziness, frontal headache and unsteady gait.  No history of seizures or TIAs.  No focal neurodeficits on exam.  Stroke work-up initiated.  On recheck, patient resting comfortably.  No focal neurodeficits on recheck.  Labs unremarkable.  CT head without evidence of intracranial bleed.  We will proceed with MRI of brain and C-spine.  MRI brain without acute intracranial abnormality.  MR C-spine with central canal narrowing at C3-4 Headache has improved.  Patient ambulatory and gait is steady.  No focal neurodeficits on recheck.  Meclizine ordered and prescription given.  Symptoms felt to be related to atypical migraine with vertigo.  I feel that patient is appropriate for discharge at this time, she  agrees to close follow-up with PCP and strict return precautions were also discussed.   Final Clinical Impression(s) / ED Diagnoses Final diagnoses:  Atypical migraine  Vertigo    Rx / DC Orders ED Discharge Orders         Ordered    meclizine (ANTIVERT) 25 MG tablet  3 times daily PRN        10/22/20 1900           Kem Parkinson, PA-C 11/09/20 1026    Isla Pence, MD 11/09/20 1223

## 2020-11-11 DIAGNOSIS — G43909 Migraine, unspecified, not intractable, without status migrainosus: Secondary | ICD-10-CM | POA: Diagnosis not present

## 2020-12-21 DIAGNOSIS — I10 Essential (primary) hypertension: Secondary | ICD-10-CM | POA: Diagnosis not present

## 2020-12-21 DIAGNOSIS — G43119 Migraine with aura, intractable, without status migrainosus: Secondary | ICD-10-CM | POA: Diagnosis not present

## 2020-12-21 DIAGNOSIS — R42 Dizziness and giddiness: Secondary | ICD-10-CM | POA: Diagnosis not present

## 2020-12-21 DIAGNOSIS — I6782 Cerebral ischemia: Secondary | ICD-10-CM | POA: Diagnosis not present

## 2020-12-28 ENCOUNTER — Ambulatory Visit (INDEPENDENT_AMBULATORY_CARE_PROVIDER_SITE_OTHER): Payer: Medicare HMO | Admitting: Internal Medicine

## 2020-12-28 ENCOUNTER — Other Ambulatory Visit: Payer: Self-pay

## 2020-12-28 ENCOUNTER — Encounter (INDEPENDENT_AMBULATORY_CARE_PROVIDER_SITE_OTHER): Payer: Self-pay | Admitting: Internal Medicine

## 2020-12-28 VITALS — BP 137/70 | HR 71 | Temp 98.8°F | Ht 61.5 in | Wt 138.0 lb

## 2020-12-28 DIAGNOSIS — R143 Flatulence: Secondary | ICD-10-CM | POA: Insufficient documentation

## 2020-12-28 DIAGNOSIS — K5080 Crohn's disease of both small and large intestine without complications: Secondary | ICD-10-CM

## 2020-12-28 DIAGNOSIS — K219 Gastro-esophageal reflux disease without esophagitis: Secondary | ICD-10-CM | POA: Diagnosis not present

## 2020-12-28 MED ORDER — SIMETHICONE 180 MG PO CAPS: 180.0000 mg | ORAL_CAPSULE | Freq: Two times a day (BID) | ORAL | 0 refills | Status: DC | PRN

## 2020-12-28 NOTE — Progress Notes (Signed)
Presenting complaint;  Follow-up for Crohn's disease.  Database and subjective:  Patient is 80 year old Caucasian female who has history of small and large bowel Crohn's disease dating back to October 2008.  She has been maintained on oral mesalamine.  Last surveillance colonoscopy was in July 2020.  Terminal ileum could not be intubated.  She had sigmoid diverticulitis but no evidence of active Crohn's disease.  She was felt to have flareup back in January 2022 and treated with 6 weeks course of budesonide.  Fecal calprotectin in November last year was 67. She also has chronic GERD.  Last EGD was in August 21 revealing for centimeters sliding hiatal hernia and few hyperplastic appearing gastric polyps. Last visit was a televisit on 07/15/2020.  Patient states she is doing well.  She is having 1-2 formed stools daily.  She denies abdominal pain melena or rectal bleeding.  She is not having incontinence anymore.  She does complain of flatulence which at times is uncontrollable but not associated with pain or nausea.  She is watching her diet and trying to lose weight.  She has lost 3 pounds since her last visit.  She is still having breakthrough heartburn in the evening and on some days she takes famotidine twice.  She does not have any problems during the daytime. She remains very active.  She plays pickle ball. She was seen in emergency room on 10/22/2020 for blurred vision dizziness and headache.  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  She is on low-dose venlafaxine. She does not take OTC NSAIDs.   Current Medications: Outpatient Encounter Medications as of 12/28/2020  Medication Sig   acetaminophen (TYLENOL) 500 MG tablet Take 500-1,000 mg by mouth every 6 (six) hours as needed for moderate pain or headache.    atorvastatin (LIPITOR) 10 MG tablet Take 10 mg by mouth at bedtime.   Cholecalciferol (DIALYVITE VITAMIN D 5000) 125 MCG (5000 UT) capsule Take  5,000 Units by mouth daily.   esomeprazole (NEXIUM) 40 MG capsule Take 1 capsule (40 mg total) by mouth daily before breakfast.   famotidine (PEPCID) 20 MG tablet Take 1 tablet (20 mg total) by mouth at bedtime.   meclizine (ANTIVERT) 25 MG tablet Take 1 tablet (25 mg total) by mouth 3 (three) times daily as needed for dizziness. May cause drowsiness   mesalamine (PENTASA) 500 MG CR capsule Take 2 capsules (1,000 mg total) by mouth 3 (three) times daily. (Patient taking differently: Take 500 mg by mouth 3 (three) times daily. Two in the am, one at noon, two at bed time.)   metoprolol succinate (TOPROL-XL) 25 MG 24 hr tablet Take 12.5 mg by mouth at bedtime.   Misc Natural Products (OSTEO BI-FLEX ADV JOINT SHIELD PO) Take 1 tablet by mouth daily. Take 1/2 tablet   Multiple Vitamin (MULTIVITAMIN) tablet Take 1 tablet by mouth daily.   Multiple Vitamins-Minerals (AIRBORNE) CHEW Chew 1 tablet by mouth daily.   OVER THE COUNTER MEDICATION Take 1 tablet by mouth daily. cognitex elite otc supplement   venlafaxine XR (EFFEXOR-XR) 37.5 MG 24 hr capsule Take 37.5 mg by mouth daily.   budesonide (ENTOCORT EC) 3 MG 24 hr capsule Take 3 capsules (9 mg total) by mouth daily. (Patient not taking: No sig reported)   [DISCONTINUED] aspirin EC 81 MG tablet Take 81 mg by mouth daily. Swallow whole.   [DISCONTINUED] bismuth subsalicylate (PEPTO BISMOL) 262 MG chewable tablet Chew 524 mg by mouth as needed. As needed  for diarrhea (Patient not taking: No sig reported)   Facility-Administered Encounter Medications as of 12/28/2020  Medication   sodium chloride flush (NS) 0.9 % injection 3 mL     Objective: Blood pressure 137/70, pulse 71, temperature 98.8 F (37.1 C), temperature source Oral, height 5' 1.5" (1.562 m), weight 138 lb (62.6 kg). Patient is alert and in no acute distress. She is wearing a mask. Conjunctiva is pink. Sclera is nonicteric Oropharyngeal mucosa is normal. No neck masses or thyromegaly  noted. Cardiac exam with regular rhythm normal S1 and S2. No murmur or gallop noted. Lungs are clear to auscultation. Abdomen is symmetrical.  Long right subcostal scar.  Bowel sounds are normal.  On palpation abdomen is soft and nontender with organomegaly or masses. No LE edema or clubbing noted.  Labs/studies Results:   CBC Latest Ref Rng & Units 10/22/2020 05/13/2020 01/28/2020  WBC 4.0 - 10.5 K/uL 5.2 4.6 5.3  Hemoglobin 12.0 - 15.0 g/dL 13.3 13.5 13.3  Hematocrit 36.0 - 46.0 % 41.3 38.9 40.9  Platelets 150 - 400 K/uL 193 202 204    CMP Latest Ref Rng & Units 10/22/2020 06/14/2020 05/13/2020  Glucose 70 - 99 mg/dL 130(H) - 152(H)  BUN 8 - 23 mg/dL 16 - 14  Creatinine 0.44 - 1.00 mg/dL 0.99 0.90 0.71  Sodium 135 - 145 mmol/L 140 - 139  Potassium 3.5 - 5.1 mmol/L 4.1 - 3.7  Chloride 98 - 111 mmol/L 106 - 105  CO2 22 - 32 mmol/L 25 - 25  Calcium 8.9 - 10.3 mg/dL 8.9 - 9.3  Total Protein 6.5 - 8.1 g/dL 6.8 - 6.4  Total Bilirubin 0.3 - 1.2 mg/dL 0.5 - 0.4  Alkaline Phos 38 - 126 U/L 84 - -  AST 15 - 41 U/L 22 - 21  ALT 0 - 44 U/L 19 - 18    Sed rate 9  Blood work reviewed with patient. Blood work was done in the emergency room when she presented with headache dizziness and blurred vision.   Assessment:  #1.  Crohn's disease of small and large bowel.  She was treated with budesonide for 6 weeks starting in January 2022.  She is having formed stools just 1 mg of loperamide daily.  No alarm symptoms.  She appears to be in remission.  #2.  GERD.  She is on both PPI and histamine 2 blocker but still having intermittent heartburn usually in the evening.  Since she is not having any breakthrough symptoms during the daytime would like to see if she can be maintained on lower PPI dose.  #3.  Flatulence.  Patient advised to cut back on intake of carbohydrates and starches and she will use simethicone on an as-needed basis.  Plan:  Phazyme 180 mg p.o. twice daily as needed. Continue  Pentasa at current dose of 1 g by mouth 3 times a day. Continue famotidine 20 to 40 mg every evening but try Nexium OTC 20 mg in the morning and see if it works otherwise can stay on 40 mg daily. Will not change prescription until she tells [name] that 20 mg is working. Once again patient reminded not to take NSAIDs. Office visit in 6 months.

## 2020-12-28 NOTE — Patient Instructions (Signed)
Can take simethicone or Phazyme 180 mg twice daily as needed for flatulence. Consider trying Nexium OTC 20 mg instead of 40 mg every morning but continue Pepcid/famotidine as before. Please call office if you have abdominal pain

## 2020-12-30 DIAGNOSIS — K219 Gastro-esophageal reflux disease without esophagitis: Secondary | ICD-10-CM | POA: Diagnosis not present

## 2020-12-30 DIAGNOSIS — I10 Essential (primary) hypertension: Secondary | ICD-10-CM | POA: Diagnosis not present

## 2021-01-07 DIAGNOSIS — F418 Other specified anxiety disorders: Secondary | ICD-10-CM | POA: Diagnosis not present

## 2021-03-29 DIAGNOSIS — E785 Hyperlipidemia, unspecified: Secondary | ICD-10-CM | POA: Diagnosis not present

## 2021-04-12 DIAGNOSIS — Z23 Encounter for immunization: Secondary | ICD-10-CM | POA: Diagnosis not present

## 2021-04-12 DIAGNOSIS — I10 Essential (primary) hypertension: Secondary | ICD-10-CM | POA: Diagnosis not present

## 2021-04-12 DIAGNOSIS — K219 Gastro-esophageal reflux disease without esophagitis: Secondary | ICD-10-CM | POA: Diagnosis not present

## 2021-04-12 DIAGNOSIS — F331 Major depressive disorder, recurrent, moderate: Secondary | ICD-10-CM | POA: Diagnosis not present

## 2021-04-12 DIAGNOSIS — R079 Chest pain, unspecified: Secondary | ICD-10-CM | POA: Diagnosis not present

## 2021-04-12 DIAGNOSIS — K529 Noninfective gastroenteritis and colitis, unspecified: Secondary | ICD-10-CM | POA: Diagnosis not present

## 2021-04-12 DIAGNOSIS — Z0001 Encounter for general adult medical examination with abnormal findings: Secondary | ICD-10-CM | POA: Diagnosis not present

## 2021-04-12 DIAGNOSIS — E782 Mixed hyperlipidemia: Secondary | ICD-10-CM | POA: Diagnosis not present

## 2021-04-12 DIAGNOSIS — G43909 Migraine, unspecified, not intractable, without status migrainosus: Secondary | ICD-10-CM | POA: Diagnosis not present

## 2021-06-28 ENCOUNTER — Ambulatory Visit (INDEPENDENT_AMBULATORY_CARE_PROVIDER_SITE_OTHER): Payer: Medicare HMO | Admitting: Internal Medicine

## 2021-06-28 ENCOUNTER — Encounter (INDEPENDENT_AMBULATORY_CARE_PROVIDER_SITE_OTHER): Payer: Self-pay | Admitting: Internal Medicine

## 2021-06-28 ENCOUNTER — Other Ambulatory Visit: Payer: Self-pay

## 2021-06-28 VITALS — BP 153/77 | HR 78 | Temp 98.9°F | Ht 61.5 in | Wt 140.2 lb

## 2021-06-28 DIAGNOSIS — K2100 Gastro-esophageal reflux disease with esophagitis, without bleeding: Secondary | ICD-10-CM

## 2021-06-28 DIAGNOSIS — K5080 Crohn's disease of both small and large intestine without complications: Secondary | ICD-10-CM | POA: Diagnosis not present

## 2021-06-28 DIAGNOSIS — R151 Fecal smearing: Secondary | ICD-10-CM

## 2021-06-28 DIAGNOSIS — R159 Full incontinence of feces: Secondary | ICD-10-CM | POA: Insufficient documentation

## 2021-06-28 MED ORDER — METAMUCIL SMOOTH TEXTURE 58.6 % PO POWD: 1.0000 | Freq: Every day | ORAL | Status: DC

## 2021-06-28 NOTE — Progress Notes (Signed)
Presenting complaint;  Follow-up for Crohn's disease and chronic GERD.  Database and subjective:  Patient is 80 year old Caucasian female who is here for scheduled visit.  She has a history of ileocolonic Crohn's disease with indolent course who has done well with oral mesalamine which she has been taking for 16 years.  She says she is doing very well.  Her appetite is good.  She generally has 1 bowel movement per day.  She continues to complain of fecal smearing or leakage.  Therefore she is using a pad.  It happens almost daily and its small in amount.  No melena or rectal bleeding reported.  She has occasional cramping which resolved spontaneously.  She says heartburn is well controlled with therapy.  She is watching her diet.  She has occasional dysphagia with bread but she has not had any episode of food impaction. She says she had flu shot in October this year and she had fever and myalgias which lasted 3 days. She remains very active.  She plays pickle ball couple of times a week.  Prior GI studies as follows  Colonoscopy in October 2008 in [facility] reveals Multiple diverticula in sigmoid and descending colon. Ulcerated terminal ileum and biopsy revealed active chronic ileitis findings compatible with diagnosis of Crohn's disease.  Colonoscopy in August 2014 revealed Scattered small ulcers involving terminal ileum.  Biopsy revealed ileitis Multiple diverticula at descending and sigmoid colon. Small tubular adenoma removed from ascending colon Two ulcerated areas in the distal sigmoid colon and biopsy revealed mildly active colitis with crypt distortion.  Colonoscopy in July 2020 revealed left-sided diverticulosis with sigmoid diverticulitis but no colitis.  TI could not be examined.  CT enterography in December 2021 revealed wall thickening to 5 cm of terminal ileum.  Left-sided diverticulosis.  She had esophagogastroduodenoscopy in September last year for noncardiac  chest pain.study revealed mild changes of reflux esophagitis at GE junction and for centimeter hiatal hernia.  Current Medications: Outpatient Encounter Medications as of 06/28/2021  Medication Sig   acetaminophen (TYLENOL) 500 MG tablet Take 500-1,000 mg by mouth every 6 (six) hours as needed for moderate pain or headache.    atorvastatin (LIPITOR) 10 MG tablet Take 10 mg by mouth at bedtime.   Cholecalciferol (DIALYVITE VITAMIN D 5000) 125 MCG (5000 UT) capsule Take 5,000 Units by mouth daily.   esomeprazole (NEXIUM) 20 MG capsule Take 20 mg by mouth. OTC - one qam   famotidine (PEPCID) 20 MG tablet Take 1 tablet (20 mg total) by mouth at bedtime.   Loperamide HCl (IMODIUM PO) Take by mouth. One half daily   meclizine (ANTIVERT) 25 MG tablet Take 1 tablet (25 mg total) by mouth 3 (three) times daily as needed for dizziness. May cause drowsiness   mesalamine (PENTASA) 500 MG CR capsule Take 2 capsules (1,000 mg total) by mouth 3 (three) times daily. (Patient taking differently: Take 500 mg by mouth 3 (three) times daily. Two in the am, one at noon, two at bed time.)   metoprolol succinate (TOPROL-XL) 25 MG 24 hr tablet Take 12.5 mg by mouth at bedtime.   Misc Natural Products (OSTEO BI-FLEX ADV JOINT SHIELD PO) Take by mouth daily. Take 1/2 tablet   Multiple Vitamin (MULTIVITAMIN) tablet Take 1 tablet by mouth daily.   Multiple Vitamins-Minerals (AIRBORNE) CHEW Chew 1 tablet by mouth daily.   OVER THE COUNTER MEDICATION Take 1 tablet by mouth daily. cognitex elite otc supplement   OVER THE COUNTER MEDICATION OTC [employer] brand  allergy relief one daily   venlafaxine XR (EFFEXOR-XR) 37.5 MG 24 hr capsule Take 37.5 mg by mouth daily.   [DISCONTINUED] Simethicone 180 MG CAPS Take 1 capsule (180 mg total) by mouth 2 (two) times daily as needed.   [DISCONTINUED] esomeprazole (NEXIUM) 40 MG capsule Take 1 capsule (40 mg total) by mouth daily before breakfast.   Facility-Administered Encounter  Medications as of 06/28/2021  Medication   sodium chloride flush (NS) 0.9 % injection 3 mL     Objective: Blood pressure (!) 153/77, pulse 78, temperature 98.9 F (37.2 C), temperature source Oral, height 5' 1.5" (1.562 m), weight 140 lb 3.2 oz (63.6 kg). Patient is alert and in no acute distress. Conjunctiva is pink. Sclera is nonicteric Oropharyngeal mucosa is normal. No neck masses or thyromegaly noted. Cardiac exam with regular rhythm normal S1 and S2. No murmur or gallop noted. Lungs are clear to auscultation. Abdomen is symmetrical.  She has long right subcostal and Pfannenstiel scars.  On palpation abdomen is soft and nontender with organomegaly or masses. No LE edema or clubbing noted.  Labs/studies Results:   CBC Latest Ref Rng & Units 10/22/2020 05/13/2020 01/28/2020  WBC 4.0 - 10.5 K/uL 5.2 4.6 5.3  Hemoglobin 12.0 - 15.0 g/dL 13.3 13.5 13.3  Hematocrit 36.0 - 46.0 % 41.3 38.9 40.9  Platelets 150 - 400 K/uL 193 202 204    CMP Latest Ref Rng & Units 10/22/2020 06/14/2020 05/13/2020  Glucose 70 - 99 mg/dL 130(H) - 152(H)  BUN 8 - 23 mg/dL 16 - 14  Creatinine 0.44 - 1.00 mg/dL 0.99 0.90 0.71  Sodium 135 - 145 mmol/L 140 - 139  Potassium 3.5 - 5.1 mmol/L 4.1 - 3.7  Chloride 98 - 111 mmol/L 106 - 105  CO2 22 - 32 mmol/L 25 - 25  Calcium 8.9 - 10.3 mg/dL 8.9 - 9.3  Total Protein 6.5 - 8.1 g/dL 6.8 - 6.4  Total Bilirubin 0.3 - 1.2 mg/dL 0.5 - 0.4  Alkaline Phos 38 - 126 U/L 84 - -  AST 15 - 41 U/L 22 - 21  ALT 0 - 44 U/L 19 - 18    Hepatic Function Latest Ref Rng & Units 10/22/2020 05/13/2020  Total Protein 6.5 - 8.1 g/dL 6.8 6.4  Albumin 3.5 - 5.0 g/dL 3.9 -  AST 15 - 41 U/L 22 21  ALT 0 - 44 U/L 19 18  Alk Phosphatase 38 - 126 U/L 84 -  Total Bilirubin 0.3 - 1.2 mg/dL 0.5 0.4    Lab Results  Component Value Date   CRP 3.6 05/13/2020      Assessment:  #1.  Chronic GERD.  She is doing well with therapy.  EGD in September last year revealed mild changes of  reflux esophagitis and 4 cm size sliding hiatal hernia.  Will continue current therapy.  #2.  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  She is doing very well with oral mesalamine.  No indication for stepping up her therapy at this time.  #3.  Fecal smearing.  She would benefit by adding fiber supplement to her diet.  She does not have any alarm symptoms.  No indication or need for anorectal manometry at this time.   Plan:  Medication list updated. Metamucil 2 g by mouth daily at bedtime for 2 weeks and thereafter increase the dose to 4 g daily. Once again patient reminded not to use OTC NSAIDs. Patient will notify if dysphagia becomes more frequent or if she  has an episode of food impaction. Office visit in 6 months.

## 2021-06-28 NOTE — Patient Instructions (Signed)
Metamucil 2 to 4 g by mouth daily at bedtime.  Start with 2 g daily for 2 weeks and thereafter increase to 4 g unless you experience abdominal bloating or cramping. No NSAIDs i.e. ibuprofen Aleve or similar medications. Notify if swallowing difficulty becomes more frequent.

## 2021-07-13 ENCOUNTER — Encounter (HOSPITAL_COMMUNITY): Payer: Self-pay

## 2021-07-13 ENCOUNTER — Other Ambulatory Visit: Payer: Self-pay

## 2021-07-13 ENCOUNTER — Ambulatory Visit (HOSPITAL_COMMUNITY)
Admission: RE | Admit: 2021-07-13 | Discharge: 2021-07-13 | Disposition: A | Discharge status: Home / Self Care | Payer: Medicare HMO | Source: Ambulatory Visit | Attending: Family Medicine | Admitting: Family Medicine

## 2021-07-13 ENCOUNTER — Other Ambulatory Visit (HOSPITAL_COMMUNITY): Payer: Self-pay | Admitting: Family Medicine

## 2021-07-13 ENCOUNTER — Other Ambulatory Visit: Payer: Self-pay | Admitting: Family Medicine

## 2021-07-13 DIAGNOSIS — R42 Dizziness and giddiness: Secondary | ICD-10-CM

## 2021-07-13 IMAGING — CT CT HEAD W/O CM
3 series · 15 of 47 positions shown, 18 images · non-contrast
Comparison: Head CT and brain MRI [DATE]

CLINICAL DATA: Dizziness for 2 days.  Vertigo.



[Series 2: head w o · axial · 0.43mm/px · z∈[+78,+203]mm · 9 of 30 slices shown, 12 images]
[im 3/30  brain]
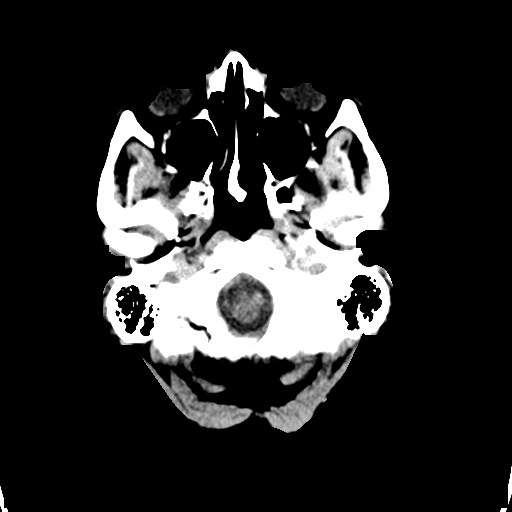
[im 3/30  bone]
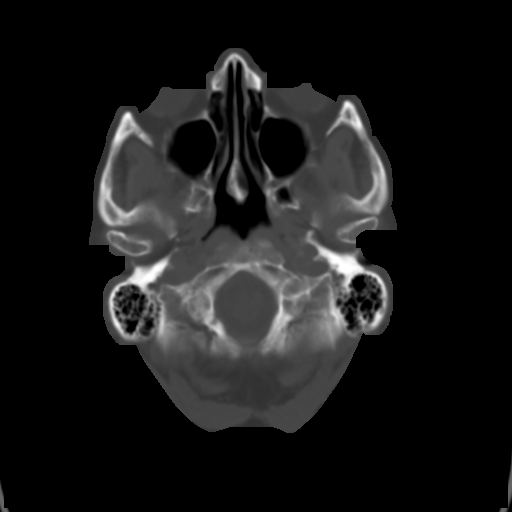
[im 6/30  brain]
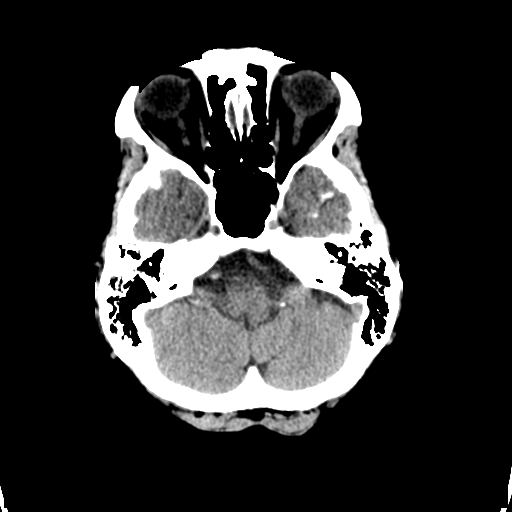
[im 9/30  brain]
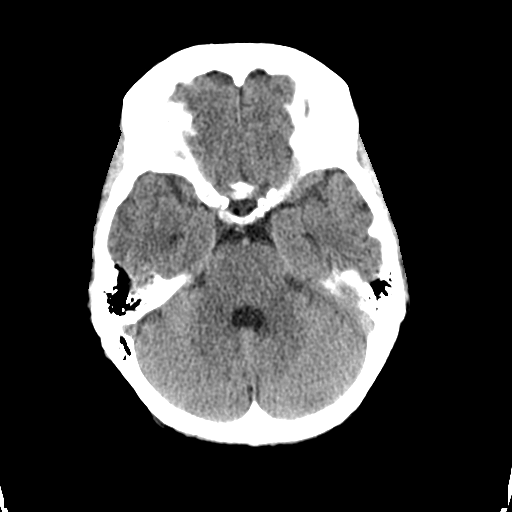
[im 12/30  brain]
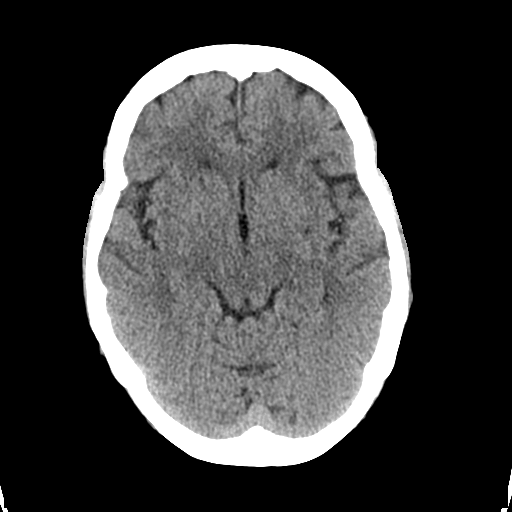
[im 16/30  brain]
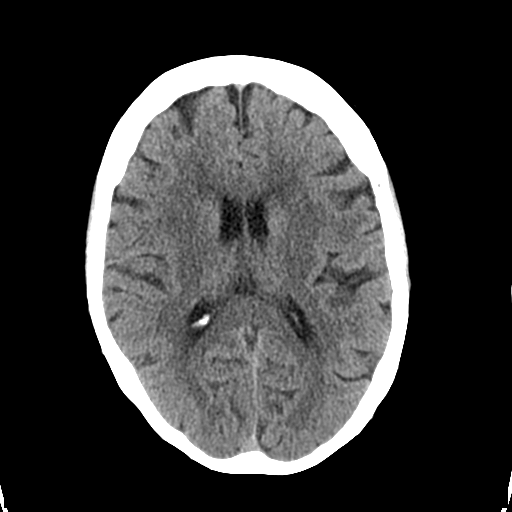
[im 16/30  bone]
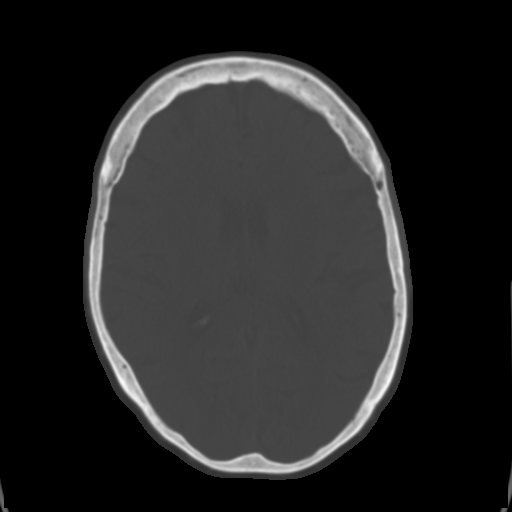
[im 19/30  brain]
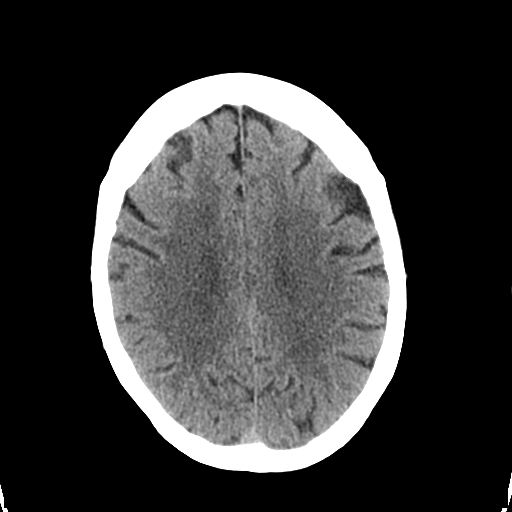
[im 22/30  brain]
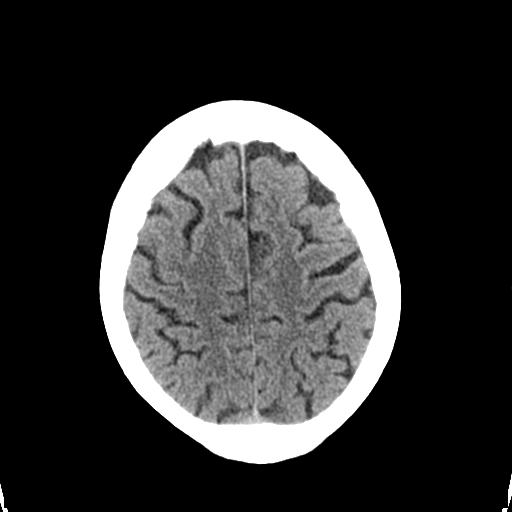
[im 25/30  brain]
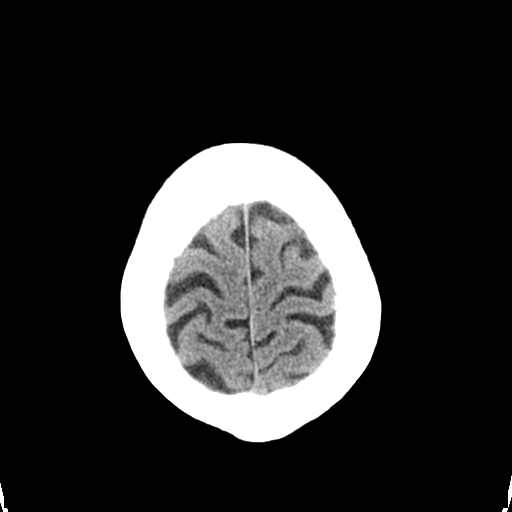
[im 28/30  brain]
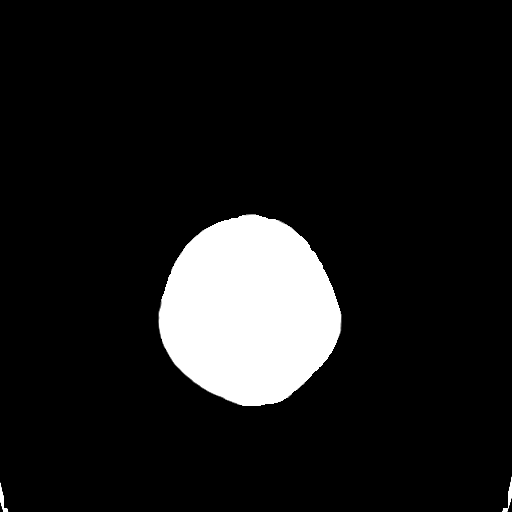
[im 28/30  bone]
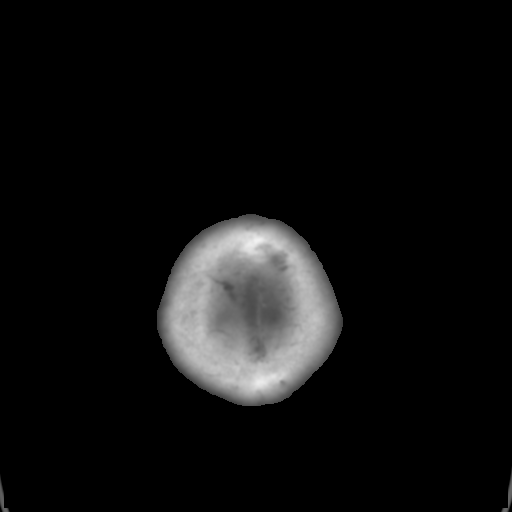

[Series 4: coronal soft · coronal · 0.30mm/px · 3 of 67 slices shown]
[im 23/67  brain]
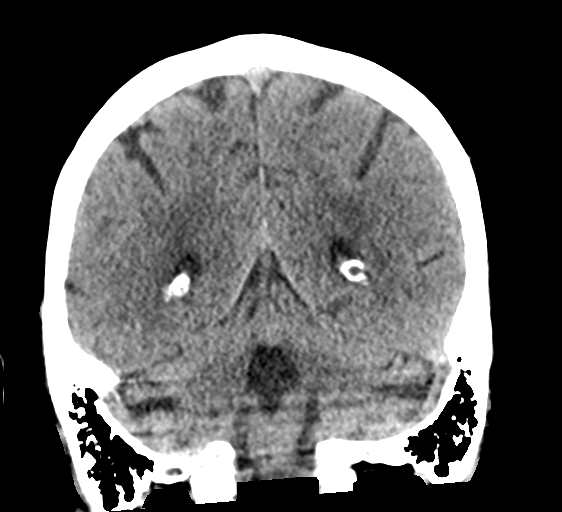
[im 30/67  brain]
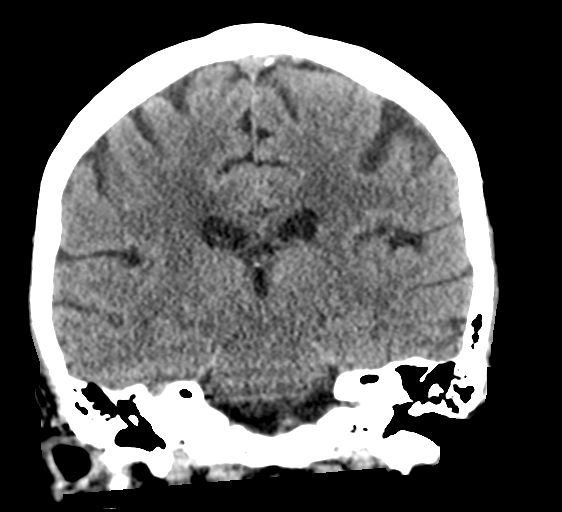
[im 37/67  brain]
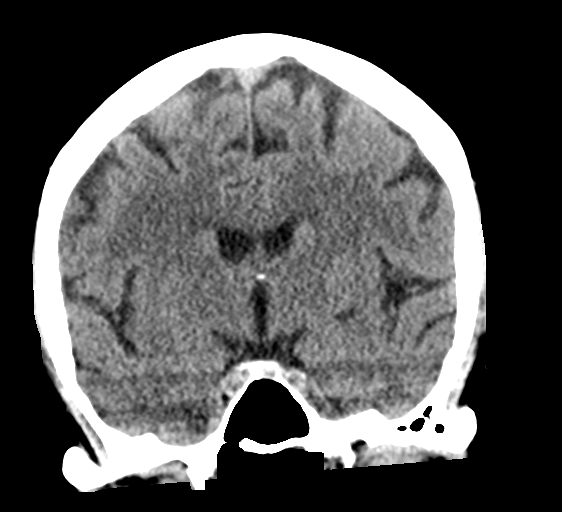

[Series 5: sagittal soft · sagittal · 0.32mm/px · 3 of 58 slices shown]
[im 20/58  brain]
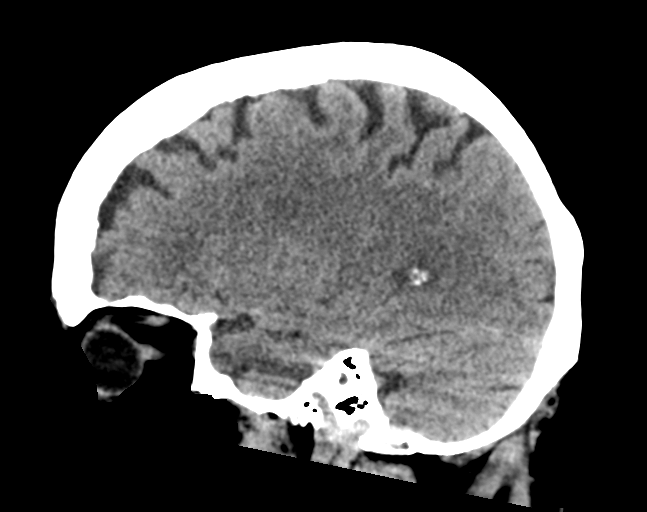
[im 29/58  brain]
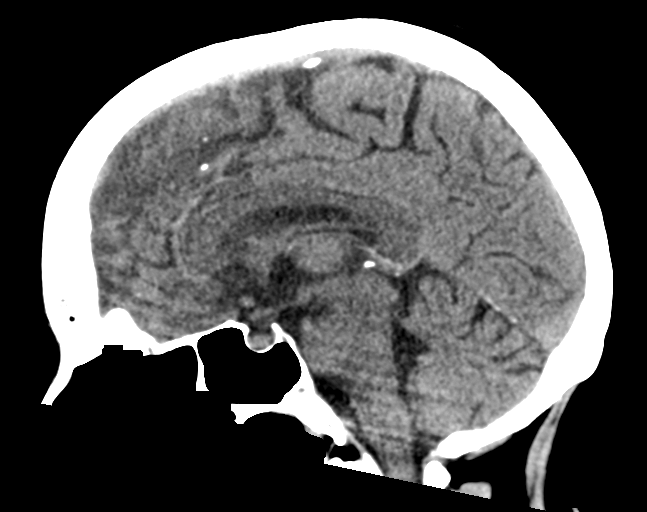
[im 39/58  brain]
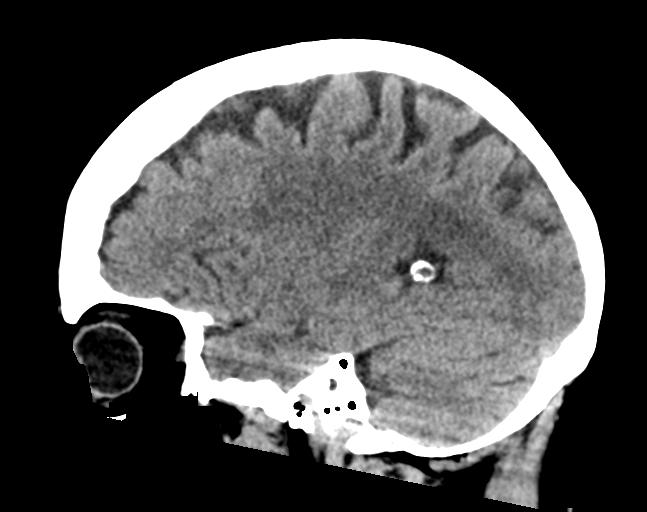

[15 of 47 positions shown; findings below may reference images not displayed]

FINDINGS: Brain: Normal brain volume for age. Similar degree of
periventricular white matter hypodensity. No intracranial
hemorrhage, mass effect, or midline shift. No hydrocephalus. The
basilar cisterns are patent. No evidence of territorial infarct or
acute ischemia. No extra-axial or intracranial fluid collection.

Vascular: Atherosclerosis of skullbase vasculature without
hyperdense vessel or abnormal calcification.

Skull: No fracture or focal lesion.

Sinuses/Orbits: Paranasal sinuses and mastoid air cells are clear.
The visualized orbits are unremarkable. Bilateral cataract
resection.

Other: None.
IMPRESSION: 1. No acute intracranial abnormality.
2. Unchanged degree of periventricular white matter hypodensity,
typically chronic small vessel ischemia.

## 2021-07-28 ENCOUNTER — Other Ambulatory Visit (INDEPENDENT_AMBULATORY_CARE_PROVIDER_SITE_OTHER): Payer: Self-pay | Admitting: *Deleted

## 2021-07-28 MED ORDER — MESALAMINE ER 500 MG PO CPCR: ORAL_CAPSULE | ORAL | 3 refills | Status: DC

## 2021-09-13 DIAGNOSIS — R42 Dizziness and giddiness: Secondary | ICD-10-CM | POA: Diagnosis not present

## 2021-09-13 DIAGNOSIS — R2689 Other abnormalities of gait and mobility: Secondary | ICD-10-CM | POA: Diagnosis not present

## 2021-10-11 ENCOUNTER — Other Ambulatory Visit (HOSPITAL_COMMUNITY): Payer: Self-pay | Admitting: Family Medicine

## 2021-10-11 DIAGNOSIS — I10 Essential (primary) hypertension: Secondary | ICD-10-CM | POA: Diagnosis not present

## 2021-10-11 DIAGNOSIS — Z13820 Encounter for screening for osteoporosis: Secondary | ICD-10-CM

## 2021-10-11 DIAGNOSIS — Z0001 Encounter for general adult medical examination with abnormal findings: Secondary | ICD-10-CM | POA: Diagnosis not present

## 2021-10-11 DIAGNOSIS — K449 Diaphragmatic hernia without obstruction or gangrene: Secondary | ICD-10-CM | POA: Diagnosis not present

## 2021-10-11 DIAGNOSIS — K529 Noninfective gastroenteritis and colitis, unspecified: Secondary | ICD-10-CM | POA: Diagnosis not present

## 2021-10-11 DIAGNOSIS — F331 Major depressive disorder, recurrent, moderate: Secondary | ICD-10-CM | POA: Diagnosis not present

## 2021-10-11 DIAGNOSIS — R079 Chest pain, unspecified: Secondary | ICD-10-CM | POA: Diagnosis not present

## 2021-10-11 DIAGNOSIS — E782 Mixed hyperlipidemia: Secondary | ICD-10-CM | POA: Diagnosis not present

## 2021-10-11 DIAGNOSIS — G43909 Migraine, unspecified, not intractable, without status migrainosus: Secondary | ICD-10-CM | POA: Diagnosis not present

## 2021-10-11 DIAGNOSIS — K219 Gastro-esophageal reflux disease without esophagitis: Secondary | ICD-10-CM | POA: Diagnosis not present

## 2021-10-13 ENCOUNTER — Ambulatory Visit (HOSPITAL_COMMUNITY): Payer: Medicare HMO | Admitting: Physical Therapy

## 2021-10-26 DIAGNOSIS — L570 Actinic keratosis: Secondary | ICD-10-CM | POA: Diagnosis not present

## 2021-10-26 DIAGNOSIS — X32XXXD Exposure to sunlight, subsequent encounter: Secondary | ICD-10-CM | POA: Diagnosis not present

## 2021-10-26 DIAGNOSIS — L821 Other seborrheic keratosis: Secondary | ICD-10-CM | POA: Diagnosis not present

## 2021-12-06 ENCOUNTER — Ambulatory Visit (INDEPENDENT_AMBULATORY_CARE_PROVIDER_SITE_OTHER): Payer: Medicare HMO | Admitting: Internal Medicine

## 2021-12-06 ENCOUNTER — Encounter (INDEPENDENT_AMBULATORY_CARE_PROVIDER_SITE_OTHER): Payer: Self-pay | Admitting: Internal Medicine

## 2021-12-06 VITALS — BP 154/83 | HR 75 | Temp 99.6°F | Ht 61.5 in | Wt 138.8 lb

## 2021-12-06 DIAGNOSIS — K5080 Crohn's disease of both small and large intestine without complications: Secondary | ICD-10-CM | POA: Diagnosis not present

## 2021-12-06 DIAGNOSIS — K219 Gastro-esophageal reflux disease without esophagitis: Secondary | ICD-10-CM

## 2021-12-06 DIAGNOSIS — R159 Full incontinence of feces: Secondary | ICD-10-CM

## 2021-12-06 MED ORDER — ESOMEPRAZOLE MAGNESIUM 40 MG PO CPDR: 40.0000 mg | DELAYED_RELEASE_CAPSULE | Freq: Every day | ORAL | 3 refills | Status: DC

## 2021-12-06 NOTE — Progress Notes (Signed)
Presenting complaint;  Follow-up for GERD and Crohn's disease.  Database and subjective:  Patient is 81 year old Caucasian female who is here for scheduled visit.  She was last seen on 06/28/2021. She has 17 to 8-year history of ileocolonic Crohn disease and has been maintained on oral mesalamine.  She also has chronic GERD.  Last esophagogastroduodenoscopy was in September 2021 for dysphagia.  She had mild changes of reflux esophagitis limited GE junction, 4 cm size sliding hiatal hernia and few small gastric polyps which were left alone. Her last surveillance colonoscopy was in July 2020 revealing sigmoid colon diverticulosis and changes of diverticulitis involving 1 diverticulum.  There was no evidence of colitis. On her last visit she was complaining of fecal smearing.  Patient was advised to take fiber supplement daily.  Patient states she is doing well.  Heartburn is well controlled.  She denies dysphagia or regurgitation.  She denies hoarseness or or sore throat but she does have cough which she feels is due to postnasal discharge.  She is having 2-3 formed stools daily.  She may occasionally skip a day.  She states if she eats pineapple she generally has 3 bowel movements on that day.  Her appetite is good.  Her weight has been stable.  She denies abdominal pain melena or rectal bleeding.  She remains on 1 mg of loperamide every morning. She says fecal seepage has decreased since she has been taking Metamucil but she does not take it every day.  She feels she may have 2-4 episodes per month and previously she was having same amount every week.   Current Medications: Outpatient Encounter Medications as of 12/06/2021  Medication Sig   acetaminophen (TYLENOL) 500 MG tablet Take 500-1,000 mg by mouth every 6 (six) hours as needed for moderate pain or headache.    atorvastatin (LIPITOR) 10 MG tablet Take 10 mg by mouth at bedtime.   Cholecalciferol (DIALYVITE VITAMIN D 5000) 125 MCG (5000 UT)  capsule Take 5,000 Units by mouth daily.   esomeprazole (NEXIUM) 20 MG capsule Take 20 mg by mouth. OTC - one qam   famotidine (PEPCID) 20 MG tablet Take 1 tablet (20 mg total) by mouth at bedtime.   Loperamide HCl (IMODIUM PO) Take by mouth. One half daily   meclizine (ANTIVERT) 25 MG tablet Take 1 tablet (25 mg total) by mouth 3 (three) times daily as needed for dizziness. May cause drowsiness   mesalamine (PENTASA) 500 MG CR capsule Take 2 capsules tid   metoprolol succinate (TOPROL-XL) 25 MG 24 hr tablet Take 12.5 mg by mouth at bedtime.   Misc Natural Products (OSTEO BI-FLEX ADV JOINT SHIELD PO) Take by mouth daily. Take 1/2 tablet   Multiple Vitamin (MULTIVITAMIN) tablet Take 1 tablet by mouth daily.   Multiple Vitamins-Minerals (AIRBORNE) CHEW Chew 1 tablet by mouth daily.   OVER THE COUNTER MEDICATION Take 1 tablet by mouth daily. cognitex elite otc supplement   OVER THE COUNTER MEDICATION OTC [employer] brand allergy relief one daily   psyllium (METAMUCIL SMOOTH TEXTURE) 58.6 % powder Take 1 packet by mouth at bedtime.   venlafaxine XR (EFFEXOR-XR) 37.5 MG 24 hr capsule Take 37.5 mg by mouth daily.   Facility-Administered Encounter Medications as of 12/06/2021  Medication   sodium chloride flush (NS) 0.9 % injection 3 mL     Objective: Blood pressure (!) 154/83, pulse 75, temperature 99.6 F (37.6 C), temperature source Oral, height 5' 1.5" (1.562 m), weight 138 lb 12.8 oz (63 kg). Patient  is alert and in no acute distress. Conjunctiva is pink. Sclera is nonicteric Oropharyngeal mucosa is normal. No neck masses or thyromegaly noted. Cardiac exam with regular rhythm normal S1 and S2. No murmur or gallop noted. Lungs are clear to auscultation. Abdomen is symmetrical soft and nontender with organomegaly or masses. No LE edema or clubbing noted.  Labs/studies Results:      Latest Ref Rng & Units 10/22/2020    2:21 PM 05/13/2020    1:38 PM 01/28/2020   12:49 PM  CBC  WBC 4.0 -  10.5 K/uL 5.2   4.6   5.3    Hemoglobin 12.0 - 15.0 g/dL 13.3   13.5   13.3    Hematocrit 36.0 - 46.0 % 41.3   38.9   40.9    Platelets 150 - 400 K/uL 193   202   204         Latest Ref Rng & Units 10/22/2020    2:21 PM 06/14/2020   12:37 PM 05/13/2020    1:38 PM  CMP  Glucose 70 - 99 mg/dL 130    152    BUN 8 - 23 mg/dL 16    14    Creatinine 0.44 - 1.00 mg/dL 0.99   0.90   0.71    Sodium 135 - 145 mmol/L 140    139    Potassium 3.5 - 5.1 mmol/L 4.1    3.7    Chloride 98 - 111 mmol/L 106    105    CO2 22 - 32 mmol/L 25    25    Calcium 8.9 - 10.3 mg/dL 8.9    9.3    Total Protein 6.5 - 8.1 g/dL 6.8    6.4    Total Bilirubin 0.3 - 1.2 mg/dL 0.5    0.4    Alkaline Phos 38 - 126 U/L 84      AST 15 - 41 U/L 22    21    ALT 0 - 44 U/L 19    18         Latest Ref Rng & Units 10/22/2020    2:21 PM 05/13/2020    1:38 PM  Hepatic Function  Total Protein 6.5 - 8.1 g/dL 6.8   6.4    Albumin 3.5 - 5.0 g/dL 3.9     AST 15 - 41 U/L 22   21    ALT 0 - 44 U/L 19   18    Alk Phosphatase 38 - 126 U/L 84     Total Bilirubin 0.3 - 1.2 mg/dL 0.5   0.4      No recent lab data on file. Patient states she is scheduled to have blood work with doctors alcohol.  Assessment:  #1.  Chronic GERD.  She is doing well with combination of PPI and H2B.  May consider dropping PPI dose at next visit but she will have to pay out-of-pocket which means she will have to pay both for PPI and H2B herself.  #2.  History of ileocolonic Crohn's disease.  She is on oral mesalamine and appears to be in remission.  She had fecal calprotectin of 67 in November 2021.  Colonoscopy in July 2020 did not reveal active disease in her colon.  TI was not examined. No indication to step up her therapy at this time.  #3.  Fecal seepage.  She is doing much better with low-dose loperamide and Metamucil.   Plan:  Patient advised to take  Metamucil daily at bedtime. Patient will bring [name] a copy of her planned blood  work. Patient reminded that she cannot take NSAIDs unless absolutely necessary in which case Cox 2 inhibitor such as Celebrex should be considered. Office visit in 6 months.

## 2021-12-06 NOTE — Patient Instructions (Addendum)
Please remember to get [name] a copy of your next blood work. Do not take arthritis medications

## 2022-03-27 DIAGNOSIS — F32A Depression, unspecified: Secondary | ICD-10-CM | POA: Diagnosis not present

## 2022-03-27 DIAGNOSIS — R42 Dizziness and giddiness: Secondary | ICD-10-CM | POA: Diagnosis not present

## 2022-03-27 DIAGNOSIS — I10 Essential (primary) hypertension: Secondary | ICD-10-CM | POA: Diagnosis not present

## 2022-03-27 DIAGNOSIS — J302 Other seasonal allergic rhinitis: Secondary | ICD-10-CM | POA: Diagnosis not present

## 2022-04-04 DIAGNOSIS — I10 Essential (primary) hypertension: Secondary | ICD-10-CM | POA: Diagnosis not present

## 2022-04-04 DIAGNOSIS — E785 Hyperlipidemia, unspecified: Secondary | ICD-10-CM | POA: Diagnosis not present

## 2022-04-11 ENCOUNTER — Other Ambulatory Visit (HOSPITAL_COMMUNITY): Payer: Self-pay | Admitting: Family Medicine

## 2022-04-11 DIAGNOSIS — R079 Chest pain, unspecified: Secondary | ICD-10-CM | POA: Diagnosis not present

## 2022-04-11 DIAGNOSIS — G43909 Migraine, unspecified, not intractable, without status migrainosus: Secondary | ICD-10-CM | POA: Diagnosis not present

## 2022-04-11 DIAGNOSIS — E782 Mixed hyperlipidemia: Secondary | ICD-10-CM | POA: Diagnosis not present

## 2022-04-11 DIAGNOSIS — Z1231 Encounter for screening mammogram for malignant neoplasm of breast: Secondary | ICD-10-CM

## 2022-04-11 DIAGNOSIS — Z0000 Encounter for general adult medical examination without abnormal findings: Secondary | ICD-10-CM | POA: Diagnosis not present

## 2022-04-11 DIAGNOSIS — F331 Major depressive disorder, recurrent, moderate: Secondary | ICD-10-CM | POA: Diagnosis not present

## 2022-04-11 DIAGNOSIS — I10 Essential (primary) hypertension: Secondary | ICD-10-CM | POA: Diagnosis not present

## 2022-04-11 DIAGNOSIS — K529 Noninfective gastroenteritis and colitis, unspecified: Secondary | ICD-10-CM | POA: Diagnosis not present

## 2022-04-11 DIAGNOSIS — K219 Gastro-esophageal reflux disease without esophagitis: Secondary | ICD-10-CM | POA: Diagnosis not present

## 2022-04-11 DIAGNOSIS — K449 Diaphragmatic hernia without obstruction or gangrene: Secondary | ICD-10-CM | POA: Diagnosis not present

## 2022-05-24 ENCOUNTER — Encounter (INDEPENDENT_AMBULATORY_CARE_PROVIDER_SITE_OTHER): Payer: Self-pay | Admitting: Gastroenterology

## 2022-06-01 ENCOUNTER — Ambulatory Visit (HOSPITAL_COMMUNITY)
Admission: RE | Admit: 2022-06-01 | Discharge: 2022-06-01 | Disposition: A | Discharge status: Home / Self Care | Payer: Medicare HMO | Source: Ambulatory Visit | Attending: Family Medicine | Admitting: Family Medicine

## 2022-06-01 DIAGNOSIS — Z1231 Encounter for screening mammogram for malignant neoplasm of breast: Secondary | ICD-10-CM | POA: Diagnosis not present

## 2022-06-01 DIAGNOSIS — Z780 Asymptomatic menopausal state: Secondary | ICD-10-CM | POA: Diagnosis not present

## 2022-06-01 DIAGNOSIS — K5090 Crohn's disease, unspecified, without complications: Secondary | ICD-10-CM | POA: Insufficient documentation

## 2022-06-01 DIAGNOSIS — Z13820 Encounter for screening for osteoporosis: Secondary | ICD-10-CM | POA: Diagnosis not present

## 2022-06-08 ENCOUNTER — Ambulatory Visit (INDEPENDENT_AMBULATORY_CARE_PROVIDER_SITE_OTHER): Payer: Medicare HMO | Admitting: Gastroenterology

## 2022-07-10 ENCOUNTER — Other Ambulatory Visit: Payer: Self-pay

## 2022-07-10 ENCOUNTER — Emergency Department (HOSPITAL_COMMUNITY)
Admission: EM | Admit: 2022-07-10 | Discharge: 2022-07-10 | Disposition: A | Discharge status: Home / Self Care | Payer: Medicare HMO | Attending: Student | Admitting: Student

## 2022-07-10 ENCOUNTER — Emergency Department (HOSPITAL_COMMUNITY): Payer: Medicare HMO

## 2022-07-10 ENCOUNTER — Encounter (HOSPITAL_COMMUNITY): Payer: Self-pay | Admitting: *Deleted

## 2022-07-10 DIAGNOSIS — R072 Precordial pain: Secondary | ICD-10-CM | POA: Diagnosis not present

## 2022-07-10 DIAGNOSIS — I10 Essential (primary) hypertension: Secondary | ICD-10-CM | POA: Diagnosis not present

## 2022-07-10 DIAGNOSIS — R079 Chest pain, unspecified: Secondary | ICD-10-CM | POA: Diagnosis not present

## 2022-07-10 LAB — CBC
HCT: 43.7 % (ref 36.0–46.0)
Hemoglobin: 14.3 g/dL (ref 12.0–15.0)
MCH: 29.3 pg (ref 26.0–34.0)
MCHC: 32.7 g/dL (ref 30.0–36.0)
MCV: 89.5 fL (ref 80.0–100.0)
Platelets: 185 10*3/uL (ref 150–400)
RBC: 4.88 MIL/uL (ref 3.87–5.11)
RDW: 13.2 % (ref 11.5–15.5)
WBC: 4.9 10*3/uL (ref 4.0–10.5)
nRBC: 0 % (ref 0.0–0.2)

## 2022-07-10 LAB — TROPONIN I (HIGH SENSITIVITY)
Troponin I (High Sensitivity): 2 ng/L (ref <18)
Troponin I (High Sensitivity): 3 ng/L (ref <18)

## 2022-07-10 LAB — BASIC METABOLIC PANEL
Anion gap: 9 (ref 5–15)
BUN: 15 mg/dL (ref 8–23)
CO2: 28 mmol/L (ref 22–32)
Calcium: 9.5 mg/dL (ref 8.9–10.3)
Chloride: 101 mmol/L (ref 98–111)
Creatinine, Ser: 0.88 mg/dL (ref 0.44–1.00)
GFR, Estimated: >60 mL/min (ref >60)
Glucose, Bld: 101 mg/dL — ABNORMAL HIGH (ref 70–99)
Potassium: 3.9 mmol/L (ref 3.5–5.1)
Sodium: 138 mmol/L (ref 135–145)

## 2022-07-10 NOTE — ED Triage Notes (Signed)
Pt c/o chest pain that started this am

## 2022-07-10 NOTE — Discharge Instructions (Signed)
As we discussed, your workup in the ER today was reassuring for acute findings.  Laboratory evaluation EKG, chest x-ray did not reveal any emergent concerns.  Your high blood pressure also significantly improved throughout your stay today.  Given that your symptoms have completely resolved, you are stable to be discharged.  That you track your blood pressures at home daily and record these findings.  Please schedule an appointment with your primary care doctor and bring a record of your readings for continued evaluation and management of your high blood pressure.  Return if development of any new or worsening symptoms.

## 2022-07-10 NOTE — ED Provider Notes (Signed)
[facility] EMERGENCY DEPARTMENT Provider Note   CSN: [id number] Arrival date & time: 07/10/22  1035     History  Chief Complaint  Patient presents with   Chest Pain    Annette Briggs is a 82 y.o. female.  Patient with history of Crohn's disease and hypertension presents today with complaints of chest pain.  She states that same began this morning when she was getting ready to go play pickleball around 9 am today. States that same was substernal in nature and did not radiate. She describes the pain as a pressure. She then checked her blood pressure and noted that it was in the 657Q systolic which was concerning for her. She then took two 81 mg aspirin without pain relief. She only takes blood pressure medications at night and took her meds last night. She endorses associated headache and lightheadedness. Denies any history of similar pain previously. States that her symptoms lasted about 3 hours and then improved. Denies any associated shortness of breath. Denies vision changes, nausea, or vomiting. She does not smoke.   The history is provided by the patient. No language interpreter was used.  Chest Pain      Home Medications Prior to Admission medications   Medication Sig Start Date End Date Taking? Authorizing Provider  acetaminophen (TYLENOL) 500 MG tablet Take 500-1,000 mg by mouth every 6 (six) hours as needed for moderate pain or headache.     [provider]  atorvastatin (LIPITOR) 10 MG tablet Take 10 mg by mouth at bedtime.    [provider]  Cholecalciferol (DIALYVITE VITAMIN D 5000) 125 MCG (5000 UT) capsule Take 5,000 Units by mouth daily.    [provider]  esomeprazole (NEXIUM) 40 MG capsule Take 1 capsule (40 mg total) by mouth daily at 12 noon. 12/06/21   [name], MD  famotidine (PEPCID) 20 MG tablet Take 1 tablet (20 mg total) by mouth at bedtime. 03/18/20   [name], MD  Loperamide HCl (IMODIUM PO) Take by mouth. One half  daily    [provider]  meclizine (ANTIVERT) 25 MG tablet Take 1 tablet (25 mg total) by mouth 3 (three) times daily as needed for dizziness. May cause drowsiness 10/22/20   Triplett, Tammy, PA-C  mesalamine (PENTASA) 500 MG CR capsule Take 2 capsules tid 07/28/21   [name], MD  metoprolol succinate (TOPROL-XL) 25 MG 24 hr tablet Take 12.5 mg by mouth at bedtime.    [provider]  Misc Natural Products (OSTEO BI-FLEX ADV JOINT SHIELD PO) Take by mouth daily. Take 1/2 tablet    [provider]  Multiple Vitamin (MULTIVITAMIN) tablet Take 1 tablet by mouth daily.    [provider]  Multiple Vitamins-Minerals (AIRBORNE) CHEW Chew 1 tablet by mouth daily.    [provider]  OVER THE COUNTER MEDICATION Take 1 tablet by mouth daily. cognitex elite otc supplement    [provider]  OVER THE COUNTER MEDICATION OTC walmart brand allergy relief one daily    [provider]  psyllium (METAMUCIL SMOOTH TEXTURE) 58.6 % powder Take 1 packet by mouth at bedtime. 06/28/21   [name], MD  venlafaxine XR (EFFEXOR-XR) 37.5 MG 24 hr capsule Take 37.5 mg by mouth daily.    [provider]      Allergies    Ciprofloxacin, Pneumovax [pneumococcal polysaccharide vaccine], and Wellbutrin [bupropion]    Review of Systems   Review of Systems  Cardiovascular:  Positive for chest pain.  All other systems reviewed and are negative.   Physical Exam Updated Vital Signs BP (!) 188/88   Pulse 66   Temp 98.1 F (36.7 C) (Oral)   Resp 14   Ht 5' 1.5" (1.562 m)   Wt 61.2 kg   SpO2 100%   BMI 25.09 kg/m  Physical Exam Vitals and nursing note reviewed.  Constitutional:      General: She is not in acute distress.    Appearance: Normal appearance. She is normal weight. She is not ill-appearing, toxic-appearing or diaphoretic.  HENT:     Head: Normocephalic and atraumatic.  Cardiovascular:     Rate and Rhythm: Normal rate  and regular rhythm.     Pulses:          Dorsalis pedis pulses are 2+ on the right side and 2+ on the left side.       Posterior tibial pulses are 2+ on the right side and 2+ on the left side.     Heart sounds: Normal heart sounds.  Pulmonary:     Effort: Pulmonary effort is normal. No respiratory distress.     Breath sounds: Normal breath sounds.  Abdominal:     Palpations: Abdomen is soft.  Musculoskeletal:        General: Normal range of motion.     Cervical back: Normal range of motion.     Right lower leg: No tenderness. No edema.     Left lower leg: No tenderness. No edema.  Skin:    General: Skin is warm and dry.  Neurological:     General: No focal deficit present.     Mental Status: She is alert.  Psychiatric:        Mood and Affect: Mood normal.        Behavior: Behavior normal.     ED Results / Procedures / Treatments   Labs (all labs ordered are listed, but only abnormal results are displayed) Labs Reviewed  BASIC METABOLIC PANEL - Abnormal; Notable for the following components:      Result Value   Glucose, Bld 101 (*)    All other components within normal limits  CBC  TROPONIN I (HIGH SENSITIVITY)  TROPONIN I (HIGH SENSITIVITY)    EKG EKG Interpretation  Date/Time:  Monday July 10 2022 11:01:24 EST Ventricular Rate:  75 PR Interval:  184 QRS Duration: 86 QT Interval:  362 QTC Calculation: 404 R Axis:   -3 Text Interpretation: Normal sinus rhythm Nonspecific ST abnormality No significant change since last tracing When compared with ECG of 22-Oct-2020 14:39, PREVIOUS ECG IS PRESENT Confirmed by Kommor, Madison ([id number]) on 07/10/2022 3:31:11 PM  Radiology DG Chest 2 View  Result Date: 07/10/2022 CLINICAL DATA:  Chest pain EXAM: CHEST - 2 VIEW COMPARISON:  Chest x-ray dated January 28, 2020 FINDINGS: The heart size and mediastinal contours are within normal limits. Both lungs are clear. The visualized skeletal structures are unremarkable. IMPRESSION: No active  cardiopulmonary disease. Electronically Signed   By: Yetta Glassman M.D.   On: 07/10/2022 11:24    Procedures Procedures    Medications Ordered in ED Medications - No data to display  ED Course/ Medical Decision Making/ A&P                           Medical Decision Making Amount and/or Complexity of Data Reviewed Labs: ordered. Radiology: ordered.   This patient is a 82 y.o.  female who presents to the ED for concern of chest pain, this involves an extensive number of treatment options, and is a complaint that carries with it a high risk of complications and morbidity. The emergent differential diagnosis prior to evaluation includes, but is not limited to,  ACS, pericarditis, aortic dissection, PE, pneumothorax, esophageal spasm or rupture, chronic angina, valvular disease, cardiomyopathy, myocarditis, pulmonary HTN, pneumonia, bronchitis, GERD, reflux/PUD, biliary disease, pancreatitis, disk disease, costochondritis, anxiety or panic attack   This is not an exhaustive differential.   Past Medical History / Co-morbidities / Social History: Hx hypertension and crohn disease  Physical Exam: Physical exam performed. The pertinent findings include: no pertinent physical exam findings  Lab Tests: I ordered, and personally interpreted labs.  The pertinent results include: Troponins flat.  No acute laboratory findings.   Imaging Studies: I ordered imaging studies including CXR. I independently visualized and interpreted imaging which showed   No active disease  I agree with the radiologist interpretation.   Cardiac Monitoring:  The patient was maintained on a cardiac monitor.  My attending physician Dr. Matilde Sprang viewed and interpreted the cardiac monitored which showed an underlying rhythm of: no STEMI. I agree with this interpretation.   Disposition:  Patient presents today with complaints of chest pain that started around 9 AM this morning and resolved approximately 3 hours  later.  She is afebrile, nontoxic-appearing, and in no acute distress with reassuring vital signs.  After evaluating all of the data points in this case, the presentation of KENLY HENCKEL is NOT consistent with Acute Coronary Syndrome (ACS) and/or myocardial ischemia, pulmonary embolism, aortic dissection; Borhaave's, significant arrythmia, pneumothorax, cardiac tamponade, or other emergent cardiopulmonary condition.  Further, the presentation of LEIGHANN AMADON is NOT consistent with pericarditis, myocarditis, mediastinitis, endocarditis, new valvular disease.  Additionally, the presentation of CARAL WHAN NOT consistent with flail chest, cardiac contusion, ARDS, or significant intra-abdominal bleeding.  Moreover, this presentation is NOT consistent with pneumonia, or sepsis.  The patient has a   heart score 3, low risk.  Considered further evaluation, however patient states that her symptoms have completely resolved for more than 4 hours and is feeling back to normal and would like to go home.  Patient's blood pressure is also improved to 153/72.  Will recommend that she track her blood pressures at home and have close primary care follow-up.  She is stable for discharge.  She is understanding and amenable with plan, educated on red flag symptoms of prompt immediate return.  Patient discharged in stable condition.   Strict return and follow-up precautions have been given by me personally or by detailed written instruction given verbally by nursing staff using the teach back method to the patient/family/caregiver(s).  Data Reviewed/Counseling: I have reviewed the patient's vital signs, nursing notes, and other relevant tests/information. I had a detailed discussion regarding the historical points, exam findings, and any diagnostic results supporting the discharge diagnosis. I also discussed the need for outpatient follow-up and the need to return to the ED if symptoms worsen or if there are any  questions or concerns that arise at home.  Findings and plan of care discussed with supervising physician Dr. Matilde Sprang who is in agreement.    Final Clinical Impression(s) / ED Diagnoses Final diagnoses:  Precordial chest pain  Hypertension, unspecified type    Rx / DC Orders ED Discharge Orders     None     An After Visit Summary was printed and given to the patient.  Nestor Lewandowsky 07/10/22 1542    Teressa Lower, MD 07/10/22 [phone]

## 2022-07-10 NOTE — ED Provider Triage Note (Signed)
Emergency Medicine Provider Triage Evaluation Note  Annette Briggs , a 82 y.o. female  was evaluated in triage.  Pt complains of chest pain and lightheadedness which began this morning at approximately 8 AM.  Patient describes a central chest pressure rated 5 out of 10 in severity.  She denies any radiation of symptoms.  She does endorse some mild lightheadedness that began at the same time.  She states she checked her blood pressure and her blood pressure was approximately 456 systolic.  She states she normally is a blood pressure in the 256 systolic range.  She does endorse taking her hypertension medications this morning.  Patient denies shortness of breath, abdominal pain, nausea, vomiting.  She does endorse multiple loose stools but states this is normal for her  Review of Systems  Positive: As above Negative: As above  Physical Exam  BP (!) 180/84   Pulse 76   Temp 98.1 F (36.7 C) (Oral)   Resp 16   Ht 5' 1.5" (1.562 m)   Wt 61.2 kg   SpO2 97%   BMI 25.09 kg/m  Gen:   Awake, no distress   Resp:  Normal effort  MSK:   Moves extremities without difficulty  Other:    Medical Decision Making  Medically screening exam initiated at 11:05 AM.  Appropriate orders placed.  Ambrose Pancoast was informed that the remainder of the evaluation will be completed by another provider, this initial triage assessment does not replace that evaluation, and the importance of remaining in the ED until their evaluation is complete.     Dorothyann Peng, PA-C 07/10/22 1110

## 2022-07-13 DIAGNOSIS — Z79899 Other long term (current) drug therapy: Secondary | ICD-10-CM | POA: Diagnosis not present

## 2022-07-13 DIAGNOSIS — I10 Essential (primary) hypertension: Secondary | ICD-10-CM | POA: Diagnosis not present

## 2022-07-13 DIAGNOSIS — F419 Anxiety disorder, unspecified: Secondary | ICD-10-CM | POA: Diagnosis not present

## 2022-07-13 DIAGNOSIS — I2089 Other forms of angina pectoris: Secondary | ICD-10-CM | POA: Diagnosis not present

## 2022-07-20 ENCOUNTER — Telehealth (INDEPENDENT_AMBULATORY_CARE_PROVIDER_SITE_OTHER): Payer: Self-pay | Admitting: *Deleted

## 2022-07-20 NOTE — Telephone Encounter (Signed)
Patient notified

## 2022-07-20 NOTE — Telephone Encounter (Signed)
Fax from help at hand. Patient is approved for patient assistance program and eligible to receive medication at no cost through the program until Jul 03, 2023.  May call pharmqacy at [phone] if patient needs to update shipping information.   Left message to return call to notify patient.

## 2022-08-04 ENCOUNTER — Encounter: Payer: Self-pay | Admitting: Cardiology

## 2022-08-04 ENCOUNTER — Ambulatory Visit: Payer: HMO | Attending: Cardiology | Admitting: Cardiology

## 2022-08-04 VITALS — BP 138/64 | HR 84 | Ht 61.5 in | Wt 138.0 lb

## 2022-08-04 DIAGNOSIS — K219 Gastro-esophageal reflux disease without esophagitis: Secondary | ICD-10-CM | POA: Diagnosis not present

## 2022-08-04 DIAGNOSIS — R072 Precordial pain: Secondary | ICD-10-CM

## 2022-08-04 DIAGNOSIS — R0600 Dyspnea, unspecified: Secondary | ICD-10-CM | POA: Diagnosis not present

## 2022-08-04 DIAGNOSIS — K50919 Crohn's disease, unspecified, with unspecified complications: Secondary | ICD-10-CM | POA: Diagnosis not present

## 2022-08-04 DIAGNOSIS — I10 Essential (primary) hypertension: Secondary | ICD-10-CM | POA: Diagnosis not present

## 2022-08-04 NOTE — Patient Instructions (Signed)
Medication Instructions:  Your physician recommends that you continue on your current medications as directed. Please refer to the Current Medication list given to you today.   Labwork: None today  Testing/Procedures: Your physician recommends that you continue on your current medications as directed. Please refer to the Current Medication list given to you today.   Follow-Up: 3 months  Any Other Special Instructions Will Be Listed Below (If Applicable).  If you need a refill on your cardiac medications before your next appointment, please call your pharmacy.
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
## 2022-08-14 ENCOUNTER — Ambulatory Visit (INDEPENDENT_AMBULATORY_CARE_PROVIDER_SITE_OTHER): Payer: HMO | Admitting: Gastroenterology

## 2022-08-14 ENCOUNTER — Encounter (INDEPENDENT_AMBULATORY_CARE_PROVIDER_SITE_OTHER): Payer: Self-pay | Admitting: Gastroenterology

## 2022-08-14 VITALS — BP 127/73 | HR 75 | Temp 98.2°F | Ht 61.25 in | Wt 137.3 lb

## 2022-08-14 DIAGNOSIS — Z111 Encounter for screening for respiratory tuberculosis: Secondary | ICD-10-CM | POA: Diagnosis not present

## 2022-08-14 DIAGNOSIS — Z1321 Encounter for screening for nutritional disorder: Secondary | ICD-10-CM | POA: Diagnosis not present

## 2022-08-14 DIAGNOSIS — K50019 Crohn's disease of small intestine with unspecified complications: Secondary | ICD-10-CM | POA: Diagnosis not present

## 2022-08-14 DIAGNOSIS — Z1159 Encounter for screening for other viral diseases: Secondary | ICD-10-CM

## 2022-08-14 NOTE — Patient Instructions (Signed)
Perform blood workup Will proceed with [facility] authorization process depending on results

## 2022-08-14 NOTE — Progress Notes (Signed)
[name], M.D. Gastroenterology & Hepatology [facility] [address] [place], [place] [address]  Primary Care Physician: Celene Squibb, MD [address] [id number]  I will communicate my assessment and recommendations to the referring MD via EMR.  Problems: ileocolonic Crohn disease   History of Present Illness: Annette Briggs is a 82 y.o. female with past medical history of ileocolonic Crohn's disease, anxiety, depression, GERD, hypertension, who presents for follow up of Crohn's disease.  The patient was last seen on 12/06/2021 (Dr. Laural Golden). At that time, the patient was advised to take Metamucil and to continue taking mesalamine for management of her Crohn's disease.  Patient reports that she has been doing relatively well. She has bouts of diarrhea every month -this usually last for a couple of days. She states that she takes Imodium daily given her history of Crohn's disease- has been taking this chronically, does not know she will have recurrent diarrhea if she stops it. Has not stopped it for years. She has been taking Metamucil to increase the bulk of her stool, believe this somewhat helps. She had a recurrent episode of diarrhea yesterday AM, had a couple episodes of nonbloody stools. The patient denies having any nausea, vomiting, fever, chills, hematochezia, melena, hematemesis, abdominal distention, abdominal pain,  jaundice, pruritus or weight loss.  Regarding the previous medications she has taken for chronic disease, she has been on Pentasa for 15 years.  Most recent blood workup from 07/10/2022 showed normal BMP and CBC.  Notably, the patient has evidence of active small bowel disease on CT enterography performed on 06/14/2020.  She had presence of hyperenhancement and newly stratification of the terminal ileum consistent with active Crohn's disease.  Some loops were slightly distended in the small bowel suggestive  of adhesions.  Last Colonoscopy: 01/15/2019, purulent discharge from diverticulosis consistent with diverticulitis, otherwise there were no other inflammatory changes in the examined colon.   Past Medical History: Past Medical History:  Diagnosis Date   Anxiety    Crohn's colitis ([place])    Depression    Essential hypertension    GERD (gastroesophageal reflux disease)     Past Surgical History: Past Surgical History:  Procedure Laterality Date   APPENDECTOMY     BLADDER SUSPENSION     CHOLECYSTECTOMY     COLONOSCOPY     2008   COLONOSCOPY N/A 02/13/2013   Procedure: COLONOSCOPY;  Surgeon: [name], MD;  [facility];  Service: Endoscopy;  Laterality: N/A;  730   COLONOSCOPY N/A 01/15/2019   Procedure: COLONOSCOPY;  Surgeon: [name], MD;  [facility];  Service: Endoscopy;  Laterality: N/A;  100   ESOPHAGOGASTRODUODENOSCOPY N/A 03/18/2020   Procedure: ESOPHAGOGASTRODUODENOSCOPY (EGD);  Surgeon: [name], MD;  [facility];  Service: Endoscopy;  Laterality: N/A;  225, moved up per office   LEFT HEART CATH AND CORONARY ANGIOGRAPHY N/A 02/02/2020   Procedure: LEFT HEART CATH AND CORONARY ANGIOGRAPHY;  Surgeon: Sherren Mocha, MD;  Location: [facility];  Service: Cardiovascular;  Laterality: N/A;   TOTAL ABDOMINAL HYSTERECTOMY      Family History: Family History  Problem Relation Age of Onset   Heart failure Mother    Kidney failure Father    Breast cancer Sister    Alzheimer's disease Sister     Social History: Social History   Tobacco Use  Smoking Status Never   Passive exposure: Never  Smokeless Tobacco Never   Social History   Substance and Sexual Activity  Alcohol Use No   Alcohol/week: 0.0 standard drinks of alcohol   Social History   Substance and Sexual Activity  Drug Use No    Allergies: Allergies  Allergen Reactions   Ciprofloxacin Other (See Comments)    Patient can't remember reaction.    Pneumovax [Pneumococcal Polysaccharide Vaccine] Swelling   Wellbutrin [Bupropion] Anxiety    Anxiety attack    Medications: Current Outpatient Medications  Medication Sig Dispense Refill   acetaminophen (TYLENOL) 500 MG tablet Take 500-1,000 mg by mouth every 6 (six) hours as needed for moderate pain or headache.      atorvastatin (LIPITOR) 10 MG tablet Take 10 mg by mouth at bedtime.     BIOTIN PO Take by mouth. One daily     Cholecalciferol (DIALYVITE VITAMIN D 5000) 125 MCG (5000 UT) capsule Take 5,000 Units by mouth daily.     esomeprazole (NEXIUM) 20 MG capsule Take 20 mg by mouth. OTC takes one qam     famotidine (PEPCID) 20 MG tablet Take 1 tablet (20 mg total) by mouth at bedtime.     levocetirizine (XYZAL) 5 MG tablet Take 1 tablet by mouth daily.     Loperamide HCl (IMODIUM PO) Take by mouth. One half daily     meclizine (ANTIVERT) 25 MG tablet Take 1 tablet (25 mg total) by mouth 3 (three) times daily as needed for dizziness. May cause drowsiness 21 tablet 0   mesalamine (PENTASA) 500 MG CR capsule Take 2 capsules tid 540 capsule 3   metoprolol succinate (TOPROL-XL) 25 MG 24 hr tablet Take 25 mg by mouth at bedtime.     Misc Natural Products (OSTEO BI-FLEX ADV JOINT SHIELD PO) Take by mouth daily. Take 1/2 tablet     Multiple Vitamin (MULTIVITAMIN) tablet Take 1 tablet by mouth daily.     Multiple Vitamins-Minerals (AIRBORNE) CHEW Chew 1 tablet by mouth daily.     OVER THE COUNTER MEDICATION Take 1 tablet by mouth daily. cognitex elite otc supplement     OVER THE COUNTER MEDICATION OTC walmart brand allergy relief one daily     psyllium (METAMUCIL SMOOTH TEXTURE) 58.6 % powder Take 1 packet by mouth at bedtime.     venlafaxine XR (EFFEXOR-XR) 37.5 MG 24 hr capsule Take 37.5 mg by mouth daily.     Current Facility-Administered Medications  Medication Dose Route Frequency Provider Last Rate Last Admin   sodium chloride flush (NS) 0.9 % injection 3 mL  3 mL Intravenous Q12H  Satira Sark, MD        Review of Systems: GENERAL: negative for malaise, night sweats HEENT: No changes in hearing or vision, no nose bleeds or other nasal problems. NECK: Negative for lumps, goiter, pain and significant neck swelling RESPIRATORY: Negative for cough, wheezing CARDIOVASCULAR: Negative for chest pain, leg swelling, palpitations, orthopnea GI: SEE HPI MUSCULOSKELETAL: Negative for joint pain or swelling, back pain, and muscle pain. SKIN: Negative for lesions, rash PSYCH: Negative for sleep disturbance, mood disorder and recent psychosocial stressors. HEMATOLOGY Negative for prolonged bleeding, bruising easily, and swollen nodes. ENDOCRINE: Negative for cold or heat intolerance, polyuria, polydipsia and goiter. NEURO: negative for tremor, gait imbalance, syncope and seizures. The remainder of the review of systems is noncontributory.   Physical Exam: BP 127/73 (BP Location: Right Arm, Patient Position: Sitting, Cuff Size: Large)   Pulse 75   Temp 98.2 F (36.8 C) (Oral)   Ht  5' 1.25" (1.556 m)   Wt 137 lb 4.8 oz (62.3 kg)   BMI 25.73 kg/m  GENERAL: The patient is AO x3, in no acute distress. HEENT: Head is normocephalic and atraumatic. EOMI are intact. Mouth is well hydrated and without lesions. NECK: Supple. No masses LUNGS: Clear to auscultation. No presence of rhonchi/wheezing/rales. Adequate chest expansion HEART: RRR, normal s1 and s2. ABDOMEN: Soft, nontender, no guarding, no peritoneal signs, and nondistended. BS +. No masses. EXTREMITIES: Without any cyanosis, clubbing, rash, lesions or edema. NEUROLOGIC: AOx3, no focal motor deficit. SKIN: no jaundice, no rashes  Imaging/Labs: as above  I personally reviewed and interpreted the available labs, imaging and endoscopic files.  Impression and Plan: Annette Briggs is a 83 y.o. female with past medical history of ileocolonic Crohn's disease, anxiety, depression, GERD, hypertension, who presents for  follow up of Crohn's disease.  The patient has had intermittent episodes of diarrhea despite taking Imodium on a daily basis.  She had evidence in the past of active Crohn's disease in her terminal ileum as documented on her most recent CT enterography in 2021, despite taking mesalamine compliantly.  She is currently presenting with active moderate ileocolonic Crohn's disease refractory to mesalamine treatment.  Ambrose Pancoast will require treatment with steroid-sparing medications. Discussion was held regarding benefits and side effects of immunomodulators/biologicals (including infections, malignancies such as lymphoma, skin cancer, tolerance to medication), as well as need to control her disease clinically and endoscopically (ideally microscopically as well) to avoid recurrence of her symptomatology.  After a thorough discussion, the patient consider she will be more interested in trying [facility] as the advanced treatment for management and control of her Crohn's disease which I find reasonable given her age.  We will obtain a hepatic panel, CRP, QuantiFERON, hepatitis B surface antigen, hepatitis C antibody and vitamin D prior to starting authorization for the medication.  - Check  hepatic panel, CRP, QuantiFERON, hepatitis B surface antigen, hepatitis C antibody and vitamin D - Will proceed with Entyvio authorization once blood workup is back -Will stop mesalamine compounds at the time she starts [facility] -May consider stopping Imodium standing dose once clinical symptoms improve  All questions were answered.      [name], MD Gastroenterology and Hepatology [facility]

## 2022-08-16 LAB — HEPATIC FUNCTION PANEL
AG Ratio: 1.5 (calc) (ref 1.0–2.5)
ALT: 13 U/L (ref 6–29)
AST: 20 U/L (ref 10–35)
Albumin: 4.3 g/dL (ref 3.6–5.1)
Alkaline phosphatase (APISO): 82 U/L (ref 37–153)
Bilirubin, Direct: 0.1 mg/dL (ref 0.0–0.2)
Globulin: 2.8 g/dL (calc) (ref 1.9–3.7)
Indirect Bilirubin: 0.3 mg/dL (calc) (ref 0.2–1.2)
Total Bilirubin: 0.4 mg/dL (ref 0.2–1.2)
Total Protein: 7.1 g/dL (ref 6.1–8.1)

## 2022-08-16 LAB — HEPATITIS C ANTIBODY: Hepatitis C Ab: NONREACTIVE

## 2022-08-16 LAB — QUANTIFERON-TB GOLD PLUS
Mitogen-NIL: 8.27 IU/mL
NIL: 0.02 IU/mL
QuantiFERON-TB Gold Plus: NEGATIVE
TB1-NIL: 0 IU/mL
TB2-NIL: <0 IU/mL

## 2022-08-16 LAB — VITAMIN D 25 HYDROXY (VIT D DEFICIENCY, FRACTURES): Vit D, 25-Hydroxy: 84 ng/mL (ref 30–100)

## 2022-08-16 LAB — HEPATITIS B SURFACE ANTIGEN: Hepatitis B Surface Ag: NONREACTIVE

## 2022-08-16 LAB — C-REACTIVE PROTEIN: CRP: 3.1 mg/L (ref <8.0)

## 2022-08-17 ENCOUNTER — Other Ambulatory Visit: Payer: Self-pay

## 2022-08-17 ENCOUNTER — Other Ambulatory Visit (INDEPENDENT_AMBULATORY_CARE_PROVIDER_SITE_OTHER): Payer: Self-pay | Admitting: Gastroenterology

## 2022-08-17 DIAGNOSIS — K50018 Crohn's disease of small intestine with other complication: Secondary | ICD-10-CM

## 2022-08-17 DIAGNOSIS — K5000 Crohn's disease of small intestine without complications: Secondary | ICD-10-CM | POA: Insufficient documentation

## 2022-08-22 ENCOUNTER — Telehealth (INDEPENDENT_AMBULATORY_CARE_PROVIDER_SITE_OTHER): Payer: Self-pay

## 2022-08-22 ENCOUNTER — Telehealth: Payer: Self-pay | Admitting: Pharmacy Technician

## 2022-08-22 NOTE — Telephone Encounter (Addendum)
Auth Submission: APPROVED '@AP'$  Payer: [employer] ADVT Medication & CPT/J Code(s) submitted: Entyvio (Vedolizumab) [id number] Route of submission (phone, fax, portal):  Phone # Fax # Auth type: Buy/Bill Units/visits requested: 3 Reference number: [id number] Approval from: 08/21/22 to 11/19/22

## 2022-08-22 NOTE — Telephone Encounter (Signed)
Disregard above message.  It has been scanned. Thanks

## 2022-08-22 NOTE — Telephone Encounter (Signed)
Entyvio approved per [employer] from 08/21/2022-11/19/2022

## 2022-08-22 NOTE — Telephone Encounter (Signed)
Crystal, Please scan the approval letter to her media tab for my review. Thanks Maudie Mercury

## 2022-08-28 ENCOUNTER — Other Ambulatory Visit: Payer: Self-pay

## 2022-08-29 ENCOUNTER — Telehealth: Payer: Self-pay | Admitting: Pharmacy Technician

## 2022-08-29 ENCOUNTER — Encounter (HOSPITAL_COMMUNITY): Admission: RE | Admit: 2022-08-29 | Payer: HMO | Source: Ambulatory Visit

## 2022-08-29 NOTE — Telephone Encounter (Signed)
Thanks Maudie Mercury, will fax this soon

## 2022-08-29 NOTE — Telephone Encounter (Signed)
Thanks, I will fill the forms out.

## 2022-08-29 NOTE — Telephone Encounter (Signed)
Patient aware to come by the office on 08/30/2022 to sign PAP forms and bring income information.

## 2022-08-29 NOTE — Telephone Encounter (Addendum)
(  Forestine Na)  Patient has not met her $26 OOP and will need PAP. (Free drug) Email has been sent to [employer].  Patient has been approved for [employer] free drug. Date: 09/11/22 - 07/03/23 Shipment will be sent to [place], [place]. Attn: Mikey Kirschner. Izora Gala has been notified via email about delivery)  Once medication is received patient can be scheduled.

## 2022-08-30 ENCOUNTER — Other Ambulatory Visit (HOSPITAL_COMMUNITY): Payer: Medicare HMO

## 2022-08-30 NOTE — Telephone Encounter (Signed)
Patient came by the office and signed her patient assistance forms, she will return tomorrow with her income information. Dr. Jenetta Downer has signed once I have all patient information I will forward information to Kim at [address] for them to fill in the facility information.

## 2022-09-01 NOTE — Telephone Encounter (Signed)
Forms faxed to Jenetta Loges at [employer] clinic.

## 2022-09-08 NOTE — Telephone Encounter (Signed)
Per Maudie Mercury she has called Entyvio connect and spoke with Wendelyn Breslow A and they says they have all the patient information that they needed.

## 2022-09-11 ENCOUNTER — Telehealth (INDEPENDENT_AMBULATORY_CARE_PROVIDER_SITE_OTHER): Payer: Self-pay

## 2022-09-11 ENCOUNTER — Encounter (HOSPITAL_COMMUNITY): Payer: Self-pay | Admitting: Gastroenterology

## 2022-09-11 NOTE — Telephone Encounter (Signed)
Patient was approved for her Entyvio per Entyvio connect from 09/11/2022-07/03/2023.

## 2022-09-11 NOTE — Telephone Encounter (Signed)
I have called and left a message asked that the patient please return call.   Kim at [employer] made aware and approval has been scanned to the chart.

## 2022-09-11 NOTE — Telephone Encounter (Signed)
Entyvio approved by Patient assistance from 09/11/2022-07/03/2023

## 2022-09-11 NOTE — Telephone Encounter (Signed)
Kim at [employer] made aware and approval has been scanned to the chart.

## 2022-09-12 NOTE — Telephone Encounter (Signed)
I called and left a message asked that she please return call. 

## 2022-09-13 ENCOUNTER — Telehealth: Payer: Self-pay

## 2022-09-13 ENCOUNTER — Ambulatory Visit (HOSPITAL_COMMUNITY)
Admission: RE | Admit: 2022-09-13 | Discharge: 2022-09-13 | Disposition: A | Discharge status: Home / Self Care | Payer: HMO | Source: Ambulatory Visit | Attending: Cardiology | Admitting: Cardiology

## 2022-09-13 DIAGNOSIS — R0609 Other forms of dyspnea: Secondary | ICD-10-CM | POA: Diagnosis not present

## 2022-09-13 DIAGNOSIS — R0600 Dyspnea, unspecified: Secondary | ICD-10-CM

## 2022-09-13 LAB — ECHOCARDIOGRAM COMPLETE
Area-P 1/2: 3.99 cm2
S' Lateral: 2.3 cm

## 2022-09-13 NOTE — Progress Notes (Signed)
*  PRELIMINARY RESULTS* Echocardiogram 2D Echocardiogram has been performed.  Annette Briggs 09/13/2022, 2:08 PM

## 2022-09-13 NOTE — Telephone Encounter (Signed)
Patient made aware she was approved for the [facility] free medication. I advised that she call the infusion center to set up her procedure and to call me back with the date and time. Patient states understanding.

## 2022-09-13 NOTE — Telephone Encounter (Signed)
Patient notified and verbalized understanding. Patient had no questions or concerns at this time. PCP copied 

## 2022-09-13 NOTE — Progress Notes (Signed)
Heart squeeze is 60-65%, no wall motion abnormalities noted, some muscle stiffness noted likely from high blood pressure and age, no valvular abnormalities were noted. Overall a reassuring study.

## 2022-09-13 NOTE — Telephone Encounter (Signed)
-----   Message from Gerrie Nordmann, NP sent at 09/13/2022  3:49 PM EDT ----- Heart squeeze is 60-65%, no wall motion abnormalities noted, some muscle stiffness noted likely from high blood pressure and age, no valvular abnormalities were noted. Overall a reassuring study.

## 2022-09-15 ENCOUNTER — Telehealth (INDEPENDENT_AMBULATORY_CARE_PROVIDER_SITE_OTHER): Payer: Self-pay | Admitting: *Deleted

## 2022-09-15 NOTE — Telephone Encounter (Signed)
Medication was delivered to the doctors office on 09/15/2022 at 10:10 am, at [place], This was a mistake and we called Maudie Mercury at [facility] street whom says we need to take the medication to [place]: Alesia Banda. Kim to call [employer] and correct the shipping address to reflect the Hospital  at [address] [place]: [employer]  I spoke with Izora Gala in the pharmacy via teams at [facility] she says we must deliver it to her on the fourth floor at [employer]. My co worker Marisa Cyphers, LPN hand delivered the medication to them at [facility] on 09/15/2022.

## 2022-09-15 NOTE — Telephone Encounter (Signed)
Patients entyvio IV med was delivered to our office today instead of hospital. I took to [employer] and gave to [place]. She told me she would give to [place] who should of received the package. My coworker crystal told me she messaged Nancy at pharmacy and was told she received med and it was now in refrigerator.

## 2022-09-15 NOTE — Telephone Encounter (Signed)
Per Alesia Banda, she has received the medication and it is now in the refrigerator at [facility].  Per Marisa Cyphers LPN she handed the medication over to [facility] there at the [facility] and [place] said she would give the medication to [employer].

## 2022-09-19 ENCOUNTER — Encounter (HOSPITAL_COMMUNITY)
Admission: RE | Admit: 2022-09-19 | Discharge: 2022-09-19 | Disposition: A | Discharge status: Home / Self Care | Payer: HMO | Source: Ambulatory Visit | Attending: Gastroenterology | Admitting: Gastroenterology

## 2022-09-19 VITALS — BP 122/62 | HR 65 | Temp 97.6°F | Resp 12 | Ht 61.5 in | Wt 136.0 lb

## 2022-09-19 DIAGNOSIS — K50018 Crohn's disease of small intestine with other complication: Secondary | ICD-10-CM

## 2022-09-19 DIAGNOSIS — K5090 Crohn's disease, unspecified, without complications: Secondary | ICD-10-CM | POA: Insufficient documentation

## 2022-09-19 MED ORDER — VEDOLIZUMAB 300 MG IV SOLR
300.0000 mg | Freq: Once | INTRAVENOUS | Status: AC
  Administered 2022-09-19: 300 mg via INTRAVENOUS
  Filled 2022-09-19 (×2): qty 5

## 2022-09-19 NOTE — Telephone Encounter (Signed)
Scheduled at [facility] 09/19/2022 at 1 pm.

## 2022-09-19 NOTE — Telephone Encounter (Signed)
Patient states she has an appointment scheduled today at [facility] to have her first Entyvio infusion.

## 2022-09-19 NOTE — Progress Notes (Signed)
Diagnosis: Crohn's Disease  Provider:  [name] MD  Procedure: Infusion  IV Type: Peripheral, IV Location: L Antecubital  Entyvio (Vedolizumab), Dose: 300 mg  Infusion Start Time: [id number]  Infusion Stop Time: 1430  Post Infusion IV Care: Observation period completed and Peripheral IV Discontinued  Discharge: Condition: Stable, Destination: Home . AVS Provided  Performed by:  Binnie Kand, RN

## 2022-10-03 ENCOUNTER — Encounter (HOSPITAL_COMMUNITY)
Admission: RE | Admit: 2022-10-03 | Discharge: 2022-10-03 | Disposition: A | Discharge status: Home / Self Care | Payer: HMO | Source: Ambulatory Visit | Attending: Gastroenterology | Admitting: Gastroenterology

## 2022-10-03 VITALS — BP 121/50 | HR 58 | Temp 98.3°F | Resp 16

## 2022-10-03 DIAGNOSIS — K50018 Crohn's disease of small intestine with other complication: Secondary | ICD-10-CM | POA: Diagnosis not present

## 2022-10-03 MED ORDER — VEDOLIZUMAB 300 MG IV SOLR
300.0000 mg | Freq: Once | INTRAVENOUS | Status: AC
  Administered 2022-10-03: 300 mg via INTRAVENOUS
  Filled 2022-10-03: qty 5

## 2022-10-03 NOTE — Progress Notes (Signed)
Diagnosis: Crohn's Disease  Provider:  Harvel Quale MD  Procedure: Infusion  IV Type: Peripheral, IV Location: L Antecubital  Entyvio (Vedolizumab), Dose: 300 mg  Infusion Start Time: [id number]  Infusion Stop Time: [id number]  Post Infusion IV Care: Peripheral IV Discontinued  Discharge: Condition: Stable, Destination: Home . AVS Provided  Performed by:  Binnie Kand, RN

## 2022-10-27 DIAGNOSIS — Z0000 Encounter for general adult medical examination without abnormal findings: Secondary | ICD-10-CM | POA: Diagnosis not present

## 2022-10-30 ENCOUNTER — Ambulatory Visit (INDEPENDENT_AMBULATORY_CARE_PROVIDER_SITE_OTHER): Payer: HMO | Admitting: Gastroenterology

## 2022-10-30 ENCOUNTER — Other Ambulatory Visit: Payer: Self-pay

## 2022-10-30 ENCOUNTER — Telehealth: Payer: Self-pay | Admitting: Pharmacy Technician

## 2022-10-30 ENCOUNTER — Telehealth (INDEPENDENT_AMBULATORY_CARE_PROVIDER_SITE_OTHER): Payer: Self-pay

## 2022-10-30 ENCOUNTER — Encounter (INDEPENDENT_AMBULATORY_CARE_PROVIDER_SITE_OTHER): Payer: Self-pay | Admitting: Gastroenterology

## 2022-10-30 VITALS — BP 134/80 | HR 75 | Temp 97.8°F | Ht 61.5 in | Wt 136.1 lb

## 2022-10-30 DIAGNOSIS — K5080 Crohn's disease of both small and large intestine without complications: Secondary | ICD-10-CM | POA: Diagnosis not present

## 2022-10-30 DIAGNOSIS — R42 Dizziness and giddiness: Secondary | ICD-10-CM | POA: Diagnosis not present

## 2022-10-30 DIAGNOSIS — R197 Diarrhea, unspecified: Secondary | ICD-10-CM | POA: Diagnosis not present

## 2022-10-30 NOTE — Telephone Encounter (Signed)
Per Bayard Hugger he will cancel tomorrows appointment. Per Arminda Resides patient will be set up for patient assistance for her Stelara infusion and once approved they will set up infusion.

## 2022-10-30 NOTE — Telephone Encounter (Signed)
(  Note Sent to Bayard Hugger)   Annette Briggs has appt at [facility] for tomorrow, this will need to be cancelled. She will need to start Stelara, Entvyio order was discontinued, and the Stelara therapy plan is placed by Dr. Levon Hedger. What is the infusion centers phone number at [facility]?

## 2022-10-30 NOTE — Telephone Encounter (Addendum)
Annette Briggs,  Patient will need to be enrolled in Stelara (free drug) program.  Patient has been approved for Stelara (free drug) Induction dosing and maintenance dosing.  Approval date: 11/09/22 - 07/03/23 [employer]J: Phone: [phone] [facility] fax: [phone] - for maintenance dosing.

## 2022-10-30 NOTE — Telephone Encounter (Signed)
Thanks

## 2022-10-30 NOTE — Progress Notes (Signed)
Annette Briggs, M.D. Gastroenterology & Hepatology [facility]enter[facility] [address] [place], [place] [address]  Primary Care Physician: Benita Stabile, MD [address] [place] [address]  I will communicate my assessment and recommendations to the referring MD via EMR.  Problems: ileocolonic Crohn disease    History of Present Illness: Annette Briggs is a 81 y.o. female with past medical history of ileocolonic Crohn's disease, anxiety, depression, GERD, hypertension, who presents for follow up of Crohn's disease.  The patient was last seen on 08/14/2022. At that time, the patient had blood workup performed.  Decision was made to start her on Entyvio for management of chronic disease.  Has received 2 doses so far.  Is supposed to have her third loading dose tomorrow.  Patient reports that she has not been able to tolerate Entyvio too well as she had multiple episodes of explosive diarrhea a few days after receiving the dose, has had this reaction every couple of weeks since she started [facility] which last 4 days in average.  Reported she has multiple bowel movements are watery when these happens and it is very debilitating for her.  She does not want to continue Entyvio given the severity of her diarrhea.  Reports that in the past she had seldom diarrhea but now it is very frequent.  She is currently taking 1/2 table to Imodium per day and Metamucil gummy.  Has been taking this for multiple years.  She has been having nausea occasionally.  Reports having lower abdominal pain intermittently, which she describes as soreness in her lower abdomen.  Notably, the patient has evidence of active small bowel disease on CT enterography performed on 06/14/2020.  She had presence of hyperenhancement and newly stratification of the terminal ileum consistent with active Crohn's disease.  Some loops were slightly distended in the small bowel suggestive of  adhesions.  Previous medications: Pentasa  The patient denies having any vomiting, fever, chills, hematochezia, melena, hematemesis,  jaundice, pruritus.  Reports losing a few pounds recently.  Last Colonoscopy: 01/15/2019, purulent discharge from diverticulosis consistent with diverticulitis, otherwise there were no other inflammatory changes in the examined colon.   Past Medical History: Past Medical History:  Diagnosis Date   Anxiety    Crohn's colitis (HCC)    Depression    Essential hypertension    GERD (gastroesophageal reflux disease)     Past Surgical History: Past Surgical History:  Procedure Laterality Date   APPENDECTOMY     BLADDER SUSPENSION     CHOLECYSTECTOMY     COLONOSCOPY     2008   COLONOSCOPY N/A 02/13/2013   Procedure: COLONOSCOPY;  Surgeon: Malissa Hippo, MD;  [facility];  Service: Endoscopy;  Laterality: N/A;  730   COLONOSCOPY N/A 01/15/2019   Procedure: COLONOSCOPY;  Surgeon: Malissa Hippo, MD;  Location: [facility];  Service: Endoscopy;  Laterality: N/A;  100   ESOPHAGOGASTRODUODENOSCOPY N/A 03/18/2020   Procedure: ESOPHAGOGASTRODUODENOSCOPY (EGD);  Surgeon: Malissa Hippo, MD;  [facility];  Service: Endoscopy;  Laterality: N/A;  225, moved up per office   LEFT HEART CATH AND CORONARY ANGIOGRAPHY N/A 02/02/2020   Procedure: LEFT HEART CATH AND CORONARY ANGIOGRAPHY;  Surgeon: Tonny Bollman, MD;  Location: [facility] INVASIVE CV LAB;  Service: Cardiovascular;  Laterality: N/A;   TOTAL ABDOMINAL HYSTERECTOMY      Family History: Family History  Problem Relation Age of Onset   Heart failure Mother  Kidney failure Father    Breast cancer Sister    Alzheimer's disease Sister     Social History: Social History   Tobacco Use  Smoking Status Never   Passive exposure: Never  Smokeless Tobacco Never   Social History   Substance and Sexual Activity  Alcohol Use No   Alcohol/week: 0.0 standard drinks of alcohol    Social History   Substance and Sexual Activity  Drug Use No    Allergies: Allergies  Allergen Reactions   Ciprofloxacin Other (See Comments)    Patient can't remember reaction.   Pneumovax [Pneumococcal Polysaccharide Vaccine] Swelling   Wellbutrin [Bupropion] Anxiety    Anxiety attack    Medications: Current Outpatient Medications  Medication Sig Dispense Refill   acetaminophen (TYLENOL) 500 MG tablet Take 500-1,000 mg by mouth every 6 (six) hours as needed for moderate pain or headache.      atorvastatin (LIPITOR) 10 MG tablet Take 10 mg by mouth at bedtime.     BIOTIN PO Take by mouth. One daily     Cholecalciferol (DIALYVITE VITAMIN D 5000) 125 MCG (5000 UT) capsule Take 5,000 Units by mouth daily.     esomeprazole (NEXIUM) 20 MG capsule Take 20 mg by mouth. OTC takes one qam     famotidine (PEPCID) 20 MG tablet Take 1 tablet (20 mg total) by mouth at bedtime.     Loperamide HCl (IMODIUM PO) Take by mouth. One half daily     meclizine (ANTIVERT) 25 MG tablet Take 1 tablet (25 mg total) by mouth 3 (three) times daily as needed for dizziness. May cause drowsiness 21 tablet 0   metoprolol succinate (TOPROL-XL) 25 MG 24 hr tablet Take 25 mg by mouth at bedtime.     Misc Natural Products (OSTEO BI-FLEX ADV JOINT SHIELD PO) Take by mouth daily. Take 1/2 tablet     Multiple Vitamin (MULTIVITAMIN) tablet Take 1 tablet by mouth daily.     Multiple Vitamins-Minerals (AIRBORNE) CHEW Chew 1 tablet by mouth daily.     OVER THE COUNTER MEDICATION Take 1 tablet by mouth daily. cognitex elite otc supplement     OVER THE COUNTER MEDICATION OTC walmart brand allergy relief one daily     OVER THE COUNTER MEDICATION Zycam twice daily     psyllium (METAMUCIL SMOOTH TEXTURE) 58.6 % powder Take 1 packet by mouth at bedtime.     venlafaxine XR (EFFEXOR-XR) 37.5 MG 24 hr capsule Take 37.5 mg by mouth daily.     Current Facility-Administered Medications  Medication Dose Route Frequency Provider  Last Rate Last Admin   sodium chloride flush (NS) 0.9 % injection 3 mL  3 mL Intravenous Q12H Jonelle Sidle, MD        Review of Systems: GENERAL: negative for malaise, night sweats HEENT: No changes in hearing or vision, no nose bleeds or other nasal problems. NECK: Negative for lumps, goiter, pain and significant neck swelling RESPIRATORY: Negative for cough, wheezing CARDIOVASCULAR: Negative for chest pain, leg swelling, palpitations, orthopnea GI: SEE HPI MUSCULOSKELETAL: Negative for joint pain or swelling, back pain, and muscle pain. SKIN: Negative for lesions, rash PSYCH: Negative for sleep disturbance, mood disorder and recent psychosocial stressors. HEMATOLOGY Negative for prolonged bleeding, bruising easily, and swollen nodes. ENDOCRINE: Negative for cold or heat intolerance, polyuria, polydipsia and goiter. NEURO: negative for tremor, gait imbalance, syncope and seizures. The remainder of the review of systems is noncontributory.   Physical Exam: BP 134/80 (BP Location: Left Arm, Patient Position: Sitting,  Cuff Size: Normal)   Pulse 75   Temp 97.8 F (36.6 C) (Temporal)   Ht 5' 1.5" (1.562 m)   Wt 136 lb 1.6 oz (61.7 kg)   BMI 25.30 kg/m  GENERAL: The patient is AO x3, in no acute distress. HEENT: Head is normocephalic and atraumatic. EOMI are intact. Mouth is well hydrated and without lesions. NECK: Supple. No masses LUNGS: Clear to auscultation. No presence of rhonchi/wheezing/rales. Adequate chest expansion HEART: RRR, normal s1 and s2. ABDOMEN: Soft, nontender, no guarding, no peritoneal signs, and nondistended. BS +. No masses. EXTREMITIES: Without any cyanosis, clubbing, rash, lesions or edema. NEUROLOGIC: AOx3, no focal motor deficit. SKIN: no jaundice, no rashes  Imaging/Labs: as above  I personally reviewed and interpreted the available labs, imaging and endoscopic files.  Impression and Plan: ADELIS DOCTER is a 82 y.o. female with past medical  history of ileocolonic Crohn's disease, anxiety, depression, GERD, hypertension, who presents for follow up of Crohn's disease.  The patient has a history of mild to moderate Crohn's disease that has been managed with mesalamine compounds without actual resolution of her symptoms.  She was recently started on Entyvio but reports having severe bouts of watery diarrhea after receiving the medication.  This is not reported side effect of Entyvio but the patient does not want to keep taking the medication as she has felt this has debilitated her significantly.  We discussed in detail other options for management of her Crohn's disease.  I discussed that it is possible other medications may cause a similar effect as she may have presence of upstream fecal retention proximal to the areas of inflammation which may be responding to the biological medication, leading to large bowel movements as stool passes easier.  However, she would like to try other medication group.  After discussing in detail the possibilities, we agreed to start Stelara for management of her Crohn's disease.  Patient understood and agreed.  She will continue with Pentasa for now until she started Stelara and will continue using Imodium daily.  I advised her to cut down the intake of this antidiarrheal if her bowel movements start to normalize.  - Cancel Entyvio infusion tomorrow - Start Stelara induction and every 8 weeks - Continue Imodium 1/2 tablet daily for now, can try to slow down the use of this if constipated - Can restart Pentasa for now, stop once she starts Stelara  All questions were answered.      Annette Blazing, MD Gastroenterology and Hepatology [facility] Gastroenterology

## 2022-10-30 NOTE — Patient Instructions (Addendum)
Cancel Entyvio infusion tomorrow Start Stelara induction and every 8 weeks Continue Imodium 1/2 tablet daily for now, can try to slow down the use of this if feeling backed up Can restart Pentasa for now, stop it once you start Stelara

## 2022-10-31 ENCOUNTER — Encounter (HOSPITAL_COMMUNITY): Admission: RE | Admit: 2022-10-31 | Payer: HMO | Source: Ambulatory Visit

## 2022-10-31 NOTE — Telephone Encounter (Signed)
Per [employer] Stelara infusion approved . I faxed the approval to [facility] and she has scanned into Epic under the media tab.

## 2022-10-31 NOTE — Telephone Encounter (Signed)
Awaiting appointment to be scheduled for her Loading dose of Stelara at [facility] infusion clinic.

## 2022-11-07 NOTE — Telephone Encounter (Signed)
Patient assistance forms faxed to Kim at [facility] health infusion center on 11/06/2022.

## 2022-11-10 ENCOUNTER — Encounter: Payer: Self-pay | Admitting: Student

## 2022-11-10 ENCOUNTER — Ambulatory Visit: Payer: HMO | Attending: Cardiology | Admitting: Student

## 2022-11-10 VITALS — BP 130/68 | HR 80 | Ht 61.5 in | Wt 138.0 lb

## 2022-11-10 DIAGNOSIS — I10 Essential (primary) hypertension: Secondary | ICD-10-CM | POA: Diagnosis not present

## 2022-11-10 DIAGNOSIS — I2510 Atherosclerotic heart disease of native coronary artery without angina pectoris: Secondary | ICD-10-CM

## 2022-11-10 DIAGNOSIS — E785 Hyperlipidemia, unspecified: Secondary | ICD-10-CM

## 2022-11-10 MED ORDER — MESALAMINE ER 500 MG PO CPCR: ORAL_CAPSULE | ORAL | 3 refills | Status: DC

## 2022-11-10 NOTE — Patient Instructions (Signed)
Medication Instructions:  Your physician recommends that you continue on your current medications as directed. Please refer to the Current Medication list given to you today.   Labwork: None today  Testing/Procedures: None today  Follow-Up: As needed Dr.McDowell  Any Other Special Instructions Will Be Listed Below (If Applicable).  If you need a refill on your cardiac medications before your next appointment, please call your pharmacy.

## 2022-11-10 NOTE — Progress Notes (Signed)
Cardiology Office Note    Date:  11/10/2022  ID:  Annette Briggs, DOB 08/02/1940, MRN [id number] Cardiologist: Nona Dell, MD    History of Present Illness:    Annette Briggs is a 82 y.o. female with past medical history of CAD (prior catheterization in 02/2020 showing mild nonobstructive disease), HTN, HLD, GERD and Crohn's disease who presents to the office today for 29-month follow-up.  She was last examined by Charlsie Quest, NP in 08/2022 following a recent Emergency Department visit for chest pain. At the time of her visit, she reported overall doing well and while she did have some dyspnea on exertion when playing pickle ball, she denied any associated chest pain. She was continued on her current medical therapy and a follow-up echocardiogram was recommended for reassessment. Follow-up echocardiogram showed a preserved EF of 60 to 65% with no wall motion abnormalities. She did have grade 1 diastolic dysfunction, normal RV function and no significant valve abnormalities.  In talking with the patient today, she denies any recurrent chest pain since her prior Emergency Department visit. She had resumed playing pickle ball without any anginal symptoms but has not played in the past week due to back pain but says this has significantly improved and she is planning to resume her normal activities next week. She reports her breathing has been stable and denies any specific dyspnea on exertion, orthopnea, PND or pitting edema. No recent palpitations. Reports her prior episodes of chest pain were occurring in the setting of significant stress.  Studies Reviewed:   EKG: EKG is not ordered today.  LHC: 02/2020    1.  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  Normal LV function with normal LVEDP, LVEF estimated at 55%  Echocardiogram: 09/2022 IMPRESSIONS     1. Left ventricular ejection fraction,  by estimation, is 60 to 65%. The  left ventricle has normal function. The left ventricle has no regional  wall motion abnormalities. Left ventricular diastolic parameters are  consistent with Grade I diastolic  dysfunction (impaired relaxation).   2. Right ventricular systolic function is normal. The right ventricular  size is normal. Tricuspid regurgitation signal is inadequate for assessing  PA pressure.   3. The mitral valve is normal in structure. No evidence of mitral valve  regurgitation. No evidence of mitral stenosis.   4. The aortic valve is tricuspid. Aortic valve regurgitation is not  visualized. No aortic stenosis is present.   5. The inferior vena cava is normal in size with greater than 50%  respiratory variability, suggesting right atrial pressure of 3 mmHg.   Comparison(s): No prior Echocardiogram.     Physical Exam:   VS:  BP 130/68   Pulse 80   Ht 5' 1.5" (1.562 m)   Wt 138 lb (62.6 kg)   SpO2 96%   BMI 25.65 kg/m    Wt Readings from Last 3 Encounters:  11/10/22 138 lb (62.6 kg)  10/30/22 136 lb 1.6 oz (61.7 kg)  09/19/22 136 lb (61.7 kg)     GEN: Pleasant female appearing in no acute distress NECK: No JVD; No carotid bruits CARDIAC: RRR, no murmurs, rubs, gallops RESPIRATORY:  Clear to auscultation without rales, wheezing or rhonchi  ABDOMEN: Appears non-distended. No obvious abdominal masses. EXTREMITIES: No clubbing or cyanosis. No pitting edema.  Distal pedal pulses are 2+ bilaterally.   Assessment and Plan:   1. CAD - Prior  catheterization in 02/2020 showed mild nonobstructive disease as described above.  Recent echocardiogram was also reassuring and showed a preserved EF with no regional wall motion abnormalities. She denies any recurrent anginal symptoms, therefore no indication for further cardiac testing at this time.   - Continue with risk factor modification. She remains on Atorvastatin 10 mg daily and Toprol-XL 25 mg daily.  2. HTN - BP  is well-controlled at 130/68 during today's visit. Continue current medical therapy with Toprol-XL 25 mg daily.  3. HLD - Followed by her PCP. FLP in 04/2022 showed total cholesterol 169, triglycerides 143, HDL 38 and LDL 106. Remains on Atorvastatin 10mg  daily. Would not aggressively titrate given her age and only mild CAD by prior cath.    Disposition: She prefers to follow-up with Cardiology as needed.    Signed, Ellsworth Lennox, PA-C

## 2022-11-13 NOTE — Telephone Encounter (Signed)
I have sent a message to Selena Batten at [employer] to see if she knows whether or not the patient was approved for patient assistance.

## 2022-11-14 ENCOUNTER — Ambulatory Visit: Payer: Medicare HMO | Admitting: Cardiology

## 2022-11-14 NOTE — Telephone Encounter (Signed)
Per Selena Batten, they have not heard anything regarding her pap. When she does she will notify us.

## 2022-11-15 ENCOUNTER — Encounter (HOSPITAL_COMMUNITY): Payer: Self-pay | Admitting: Gastroenterology

## 2022-11-15 NOTE — Telephone Encounter (Signed)
Per Selena Batten at [employer] patient has been approved for patient assistance and will be contacted once the pharmacy receives the medication they will reach out to the patient to set up.

## 2022-11-15 NOTE — Telephone Encounter (Signed)
After the induction dose is complete you can script to for SQ dosing to fax: [phone] for Patient assistance.

## 2022-11-20 ENCOUNTER — Ambulatory Visit (INDEPENDENT_AMBULATORY_CARE_PROVIDER_SITE_OTHER): Payer: HMO | Admitting: Gastroenterology

## 2022-11-23 NOTE — Telephone Encounter (Signed)
Patient is scheduled for induction 11/24/2022 at 1 pm at [facility].

## 2022-11-24 ENCOUNTER — Encounter (HOSPITAL_COMMUNITY)
Admission: RE | Admit: 2022-11-24 | Discharge: 2022-11-24 | Disposition: A | Discharge status: Home / Self Care | Payer: HMO | Source: Ambulatory Visit | Attending: Gastroenterology | Admitting: Gastroenterology

## 2022-11-24 VITALS — BP 153/70 | HR 75 | Temp 98.3°F | Resp 16

## 2022-11-24 DIAGNOSIS — K50018 Crohn's disease of small intestine with other complication: Secondary | ICD-10-CM | POA: Diagnosis not present

## 2022-11-24 MED ORDER — USTEKINUMAB 130 MG/26ML IV SOLN
390.0000 mg | Freq: Once | INTRAVENOUS | Status: AC
  Administered 2022-11-24: 390 mg via INTRAVENOUS
  Filled 2022-11-24: qty 78

## 2022-11-24 NOTE — Progress Notes (Signed)
Diagnosis: Crohn's Disease  Provider:  [name] MD  Procedure: IV Infusion  IV Type: Peripheral, IV Location: R Antecubital  Stelera (Ustekinumab), Dose: 390 mg  Infusion Start Time: 1354 pm  Infusion Stop Time: 1455 pm  Post Infusion IV Care: Observation period completed and Peripheral IV Discontinued  Discharge: Condition: Good, Destination: Home . AVS Provided  Performed by:  Forrest Moron, RN

## 2022-11-24 NOTE — Addendum Note (Signed)
Encounter addended by: Wyvonne Lenz, RN on: 11/24/2022 3:33 PM  Actions taken: Therapy plan modified

## 2022-11-29 NOTE — Telephone Encounter (Signed)
Due January 19, 2023 for maintenance doses of Stelara. Need to call [facility] pharmacy to see if they will go ahead a fill for the patient as she has patient assistance with [employer].

## 2022-11-30 ENCOUNTER — Other Ambulatory Visit (INDEPENDENT_AMBULATORY_CARE_PROVIDER_SITE_OTHER): Payer: Self-pay

## 2022-11-30 DIAGNOSIS — K50018 Crohn's disease of small intestine with other complication: Secondary | ICD-10-CM

## 2022-11-30 DIAGNOSIS — K50019 Crohn's disease of small intestine with unspecified complications: Secondary | ICD-10-CM

## 2022-11-30 DIAGNOSIS — K5080 Crohn's disease of both small and large intestine without complications: Secondary | ICD-10-CM

## 2022-11-30 MED ORDER — STELARA 90 MG/ML ~~LOC~~ SOSY
90.0000 mg | PREFILLED_SYRINGE | SUBCUTANEOUS | 7 refills | Status: DC
Start: 2022-11-30 — End: 2023-07-25

## 2022-11-30 NOTE — Telephone Encounter (Signed)
I sent the Sq maintenance dose rx for Stelara 90 mg/ml to [facility] specialty pharmacy. Noted in rx that the patient is due for the maintenance doses to start on 01/19/2023 and that they were already approved for patient assistance through [employer].

## 2022-12-06 NOTE — Telephone Encounter (Signed)
I spoke with Candise Bowens at [facility] [phone]. They supply the patient's with the free medication who are on the [employer] patient assistance program for free Stelara . Per Candise Bowens patient is all set up for shipment to her home on January 29, 2023 for the maintenance dose. She is due for the first injection on 01/19/2023.

## 2022-12-06 NOTE — Telephone Encounter (Signed)
I tried calling the patient to make her aware of shipment details of her maintenance medication.  Will need to reach out to [email or web address] to set up training in her home on how to administer Sq Stelara Q eight weeks.

## 2022-12-06 NOTE — Telephone Encounter (Signed)
I called and left a message asked that the patient return call.  

## 2022-12-06 NOTE — Telephone Encounter (Signed)
Correction below shipment is set to arrive at patient home on 01/09/2023 in order for her to have her first Sq injection on 01/19/2023.

## 2022-12-12 NOTE — Telephone Encounter (Signed)
I spoke with the patient and gave her the phone # [phone] to get a Nurse Navigator to come out to her home on 01/19/2023 to show her how to administer the Stelara Injections. Patient states understanding and will call us if she has any issues. She is aware [facility] pharmacy is going to ship medication to her home on 01/09/2023. I did advised the patient she may want to reach out to [facility] a few days ahead and make sure they had everthing on track. Patient states understanding.

## 2023-01-02 DIAGNOSIS — E785 Hyperlipidemia, unspecified: Secondary | ICD-10-CM | POA: Diagnosis not present

## 2023-01-02 DIAGNOSIS — I10 Essential (primary) hypertension: Secondary | ICD-10-CM | POA: Diagnosis not present

## 2023-01-11 ENCOUNTER — Ambulatory Visit (INDEPENDENT_AMBULATORY_CARE_PROVIDER_SITE_OTHER): Payer: HMO | Admitting: Gastroenterology

## 2023-01-11 ENCOUNTER — Encounter (INDEPENDENT_AMBULATORY_CARE_PROVIDER_SITE_OTHER): Payer: Self-pay | Admitting: Gastroenterology

## 2023-01-11 VITALS — BP 113/70 | HR 70 | Temp 97.6°F | Ht 61.5 in | Wt 136.8 lb

## 2023-01-11 DIAGNOSIS — K50018 Crohn's disease of small intestine with other complication: Secondary | ICD-10-CM

## 2023-01-11 NOTE — Progress Notes (Addendum)
Referring Provider: Benita Stabile, MD Primary Care Physician:  Benita Stabile, MD Primary GI Physician: Dr. Levon Hedger   Chief Complaint  Patient presents with   Crohn's Disease    Follow up on Crohn's. Was switched from entivyo to stelara. She has had infusion and will take first injection on 7/19.    HPI:   Annette Briggs is a 82 y.o. female with past medical history of ileocolonic Crohn's disease, anxiety, depression, GERD, hypertension   Patient presenting today for follow up of ileocolonic Crohn's disease  Patient was last seen in April 2024, at that time not been able to tolerate Entyvio, having multiple episodes of diarrhea on the mediation, taking imodium and metamucil. Also with some nausea and lower abdominal soreness.    Recommended to start stelara induction every 8 weeks, continue imodium.   Patient had Stelara infusion on 5/24, she is due for first SQ injection on 7/19.  Last labs in February 2024 with TB quant, Hep B normal HFP, CRP 3.1, VIt d Normal   Present:  She states she is doing better currently. She is having a BM usually about once per day, occasionally she will have a smaller amount of stool she will have to go back to the restroom for. She notes stools for the most part have been more formed until the last few days, have been looser but she has been eating more fruit recently. She is still taking imodium 1/2 tablet daily and metamucil wafers. She notes she has some lower abdominal pressure, this is usually relieved by defecating or passing gas. She notes that she feels like she has more flatulence recently, she Is taking a gas x pill for it. Denies any rectal bleeding. No nausea or vomiting. Weight is stable, she reports appetite is good. She had recent labs with PCP a few weeks ago. She did stop Pentasa once Stelara was started, as instructed.   Extraintestinal Manifestations: Skin: no lesions or rashes  Joints: no joint pain or swelling  Eyes: no vision changes    Last Colonoscopy: 01/15/2019, purulent discharge from diverticulosis consistent with diverticulitis, otherwise there were no other inflammatory changes in the examined colon.  CT enterography performed on 06/14/2020.  She had presence of hyperenhancement and newly stratification of the terminal ileum consistent with active Crohn's disease.  Some loops were slightly distended in the small bowel suggestive of adhesions. Last Endoscopy:  Recommendations:    Past Medical History:  Diagnosis Date   Anxiety    Crohn's colitis (HCC)    Depression    Essential hypertension    GERD (gastroesophageal reflux disease)     Past Surgical History:  Procedure Laterality Date   APPENDECTOMY     BLADDER SUSPENSION     CHOLECYSTECTOMY     COLONOSCOPY     2008   COLONOSCOPY N/A 02/13/2013   Procedure: COLONOSCOPY;  Surgeon: Malissa Hippo, MD;  [facility]: [facility];  Service: Endoscopy;  Laterality: N/A;  730   COLONOSCOPY N/A 01/15/2019   Procedure: COLONOSCOPY;  Surgeon: Malissa Hippo, MD;  Location: [facility];  Service: Endoscopy;  Laterality: N/A;  100   ESOPHAGOGASTRODUODENOSCOPY N/A 03/18/2020   Procedure: ESOPHAGOGASTRODUODENOSCOPY (EGD);  Surgeon: Malissa Hippo, MD;  Location: [facility];  Service: Endoscopy;  Laterality: N/A;  225, moved up per office   LEFT HEART CATH AND CORONARY ANGIOGRAPHY N/A 02/02/2020   Procedure: LEFT HEART CATH AND CORONARY ANGIOGRAPHY;  Surgeon: Tonny Bollman, MD;  Location:  [facility];  Service: Cardiovascular;  Laterality: N/A;   TOTAL ABDOMINAL HYSTERECTOMY      Current Outpatient Medications  Medication Sig Dispense Refill   acetaminophen (TYLENOL) 500 MG tablet Take 500-1,000 mg by mouth every 6 (six) hours as needed for moderate pain or headache.      atorvastatin (LIPITOR) 10 MG tablet Take 10 mg by mouth at bedtime.     Cholecalciferol (DIALYVITE VITAMIN D 5000) 125 MCG (5000 UT) capsule Take 5,000 Units by mouth daily.      esomeprazole (NEXIUM) 20 MG capsule Take 20 mg by mouth. OTC takes one qam     famotidine (PEPCID) 20 MG tablet Take 1 tablet (20 mg total) by mouth at bedtime.     Loperamide HCl (IMODIUM PO) Take by mouth. One half daily     meclizine (ANTIVERT) 25 MG tablet Take 1 tablet (25 mg total) by mouth 3 (three) times daily as needed for dizziness. May cause drowsiness 21 tablet 0   metoprolol succinate (TOPROL-XL) 25 MG 24 hr tablet Take 25 mg by mouth at bedtime.     Misc Natural Products (OSTEO BI-FLEX ADV JOINT SHIELD PO) Take by mouth daily. Take 1/2 tablet     Multiple Vitamin (MULTIVITAMIN) tablet Take 1 tablet by mouth daily.     Multiple Vitamins-Minerals (AIRBORNE) CHEW Chew 1 tablet by mouth daily.     OVER THE COUNTER MEDICATION Take 1 tablet by mouth daily. cognitex elite otc supplement     OVER THE COUNTER MEDICATION OTC walmart brand allergy relief one daily     OVER THE COUNTER MEDICATION Zycam twice daily     psyllium (METAMUCIL SMOOTH TEXTURE) 58.6 % powder Take 1 packet by mouth at bedtime.     ustekinumab (STELARA) 90 MG/ML SOSY injection Inject 1 mL (90 mg total) into the skin every 8 (eight) weeks. 1 mL 7   venlafaxine XR (EFFEXOR-XR) 37.5 MG 24 hr capsule Take 37.5 mg by mouth daily.     Current Facility-Administered Medications  Medication Dose Route Frequency Provider Last Rate Last Admin   sodium chloride flush (NS) 0.9 % injection 3 mL  3 mL Intravenous Q12H Jonelle Sidle, MD        Allergies as of 01/11/2023 - Review Complete 01/11/2023  Allergen Reaction Noted   Ciprofloxacin Other (See Comments) 02/05/2013   Pneumovax [pneumococcal polysaccharide vaccine] Swelling 02/07/2013   Wellbutrin [bupropion] Anxiety 02/05/2013    Family History  Problem Relation Age of Onset   Heart failure Mother    Kidney failure Father    Breast cancer Sister    Alzheimer's disease Sister     Social History   Socioeconomic History   Marital status: Widowed    Spouse name:  Not on file   Number of children: Not on file   Years of education: Not on file   Highest education level: Not on file  Occupational History   Not on file  Tobacco Use   Smoking status: Never    Passive exposure: Never   Smokeless tobacco: Never  Vaping Use   Vaping status: Never Used  Substance and Sexual Activity   Alcohol use: No    Alcohol/week: 0.0 standard drinks of alcohol   Drug use: No   Sexual activity: Not on file  Other Topics Concern   Not on file  Social History Narrative   Not on file   Social Determinants of Health   Financial Resource Strain: Not on file  Food  Insecurity: Not on file  Transportation Needs: Not on file  Physical Activity: Not on file  Stress: Not on file  Social Connections: Not on file   Review of systems General: negative for malaise, night sweats, fever, chills, weight loss Neck: Negative for lumps, goiter, pain and significant neck swelling Resp: Negative for cough, wheezing, dyspnea at rest CV: Negative for chest pain, leg swelling, palpitations, orthopnea GI: denies melena, hematochezia, nausea, vomiting, constipation, dysphagia, odyonophagia, early satiety or unintentional weight loss. +lower abdominal pressure  +looser stools  MSK: Negative for joint pain or swelling, back pain, and muscle pain. Derm: Negative for itching or rash Psych: Denies depression, anxiety, memory loss, confusion. No homicidal or suicidal ideation.  Heme: Negative for prolonged bleeding, bruising easily, and swollen nodes. Endocrine: Negative for cold or heat intolerance, polyuria, polydipsia and goiter. Neuro: negative for tremor, gait imbalance, syncope and seizures. The remainder of the review of systems is noncontributory.  Physical Exam: There were no vitals taken for this visit. General:   Alert and oriented. No distress noted. Pleasant and cooperative.  Head:  Normocephalic and atraumatic. Eyes:  Conjuctiva clear without scleral icterus. Mouth:   Oral mucosa pink and moist. Good dentition. No lesions. Heart: Normal rate and rhythm, s1 and s2 heart sounds present.  Lungs: Clear lung sounds in all lobes. Respirations equal and unlabored. Abdomen:  +BS, soft, non-tender and non-distended. No rebound or guarding. No HSM or masses noted. Derm: No palmar erythema or jaundice Msk:  Symmetrical without gross deformities. Normal posture. Extremities:  Without edema. Neurologic:  Alert and  oriented x4 Psych:  Alert and cooperative. Normal mood and affect.  Invalid input(s): "6 MONTHS"   ASSESSMENT: Annette Briggs is a 82 y.o. female presenting today for follow up of of Crohn's disease after induction of Stelara in May.   Previously on Pentasa then switched to Entyvio at the beginning of 2024 which caused her to have significant diarrhea. She began Stelara with induction on may 24th. She is feeling some better, having 1-2 stools per day, more formed, though noting some looser stools the past few days which she attributes to high fruit intake the past few days. Denies rectal bleeding, some lower abdominal pressure which is alleviated with defecation. She Is still doing 1/2 tablet of imodium and metamucil wafers daily which we may be able to stop in the near future. Overall feeling symptoms are improving with initiation of Stelara, 1st SQ dose is 7/19. She had recent labs with PCP which I will obtain for our records. Will check inflammatory markers at her next visit.    PLAN:  Will obtain labs from PCP  2. Continue with stelara at current Q8w dosing  3. Will check inflammatory markers at next visit   All questions were answered, patient verbalized understanding and is in agreement with plan as outlined above.   Follow Up: 2 months   Rashid Whitenight L. Jeanmarie Hubert, MSN, APRN, AGNP-C Adult-Gerontology Nurse Practitioner [facility]  I have reviewed the note and agree with the APP's assessment as described in this progress  note  [name], MD Gastroenterology and Hepatology [facility] Gastroenterology

## 2023-01-11 NOTE — Patient Instructions (Signed)
Please continue with Stelara at every 8 week dosing I will obtain recent labs from your PCP Please let me know if you have any new or worsening GI issues, otherwise, we will plan to see you back around the end of September for follow up  It was a pleasure to see you today. I want to create trusting relationships with patients and provide genuine, compassionate, and quality care. I truly value your feedback! please be on the lookout for a survey regarding your visit with me today. I appreciate your input about our visit and your time in completing this!    Collins Kerby L. Jeanmarie Hubert, MSN, APRN, AGNP-C Adult-Gerontology Nurse Practitioner [facility] at [facility]

## 2023-01-16 ENCOUNTER — Other Ambulatory Visit: Payer: Self-pay

## 2023-01-26 DIAGNOSIS — Z0000 Encounter for general adult medical examination without abnormal findings: Secondary | ICD-10-CM | POA: Diagnosis not present

## 2023-01-26 DIAGNOSIS — R079 Chest pain, unspecified: Secondary | ICD-10-CM | POA: Diagnosis not present

## 2023-01-26 DIAGNOSIS — E782 Mixed hyperlipidemia: Secondary | ICD-10-CM | POA: Diagnosis not present

## 2023-01-26 DIAGNOSIS — K5090 Crohn's disease, unspecified, without complications: Secondary | ICD-10-CM | POA: Diagnosis not present

## 2023-01-26 DIAGNOSIS — F331 Major depressive disorder, recurrent, moderate: Secondary | ICD-10-CM | POA: Diagnosis not present

## 2023-01-26 DIAGNOSIS — R42 Dizziness and giddiness: Secondary | ICD-10-CM | POA: Diagnosis not present

## 2023-01-26 DIAGNOSIS — K219 Gastro-esophageal reflux disease without esophagitis: Secondary | ICD-10-CM | POA: Diagnosis not present

## 2023-01-26 DIAGNOSIS — Z713 Dietary counseling and surveillance: Secondary | ICD-10-CM | POA: Diagnosis not present

## 2023-01-26 DIAGNOSIS — Z6825 Body mass index (BMI) 25.0-25.9, adult: Secondary | ICD-10-CM | POA: Diagnosis not present

## 2023-01-26 DIAGNOSIS — I10 Essential (primary) hypertension: Secondary | ICD-10-CM | POA: Diagnosis not present

## 2023-01-26 DIAGNOSIS — R296 Repeated falls: Secondary | ICD-10-CM | POA: Diagnosis not present

## 2023-01-26 DIAGNOSIS — G43909 Migraine, unspecified, not intractable, without status migrainosus: Secondary | ICD-10-CM | POA: Diagnosis not present

## 2023-03-22 ENCOUNTER — Telehealth (INDEPENDENT_AMBULATORY_CARE_PROVIDER_SITE_OTHER): Payer: Self-pay | Admitting: *Deleted

## 2023-03-22 NOTE — Telephone Encounter (Signed)
Patient started stelara infusion on 5/24. First injection she got on time on 7/19. Her 2nd injection [facility] sent to her early on 8/23 due to transfer of pharmacy - pharmacy sent early and pt took with in a few days of getting when she was not due til 9/13. She said she did not get in much of med because when she took out med was still coming out of syringe. Does she need to take another dose of stelara since she did not get all? And since taking about 2 weeks early and not sure company will replace med early should she wait til her next due date.   Pt told me to leave a detailed voicemail if I dont get her.  [phone]

## 2023-03-23 ENCOUNTER — Other Ambulatory Visit (INDEPENDENT_AMBULATORY_CARE_PROVIDER_SITE_OTHER): Payer: Self-pay | Admitting: *Deleted

## 2023-03-23 DIAGNOSIS — K50019 Crohn's disease of small intestine with unspecified complications: Secondary | ICD-10-CM

## 2023-03-23 DIAGNOSIS — K5080 Crohn's disease of both small and large intestine without complications: Secondary | ICD-10-CM

## 2023-03-23 DIAGNOSIS — K50018 Crohn's disease of small intestine with other complication: Secondary | ICD-10-CM

## 2023-03-23 NOTE — Telephone Encounter (Signed)
I [facility] and spoke with pharmacist and was told the next dose of stelara would ship on 10/17 so she will have on 10/18. Pt aware and also aware to call if any issues with getting the medication.

## 2023-03-23 NOTE — Telephone Encounter (Signed)
Discussed with patient Annette Briggs will need to wait until Annette Briggs gets her next dose, I would not advise getting an earlier dose on 9/13. Maybe Annette Briggs can ask the company to send another one so Annette Briggs can receive the dose 8 weeks after her August dose.  Patient verbalized understanding and Annette Briggs is aware Annette Briggs is due for shipment on 10-18 and if Annette Briggs does nor receive med to let me know.

## 2023-03-29 DIAGNOSIS — C44329 Squamous cell carcinoma of skin of other parts of face: Secondary | ICD-10-CM | POA: Diagnosis not present

## 2023-04-23 ENCOUNTER — Telehealth (INDEPENDENT_AMBULATORY_CARE_PROVIDER_SITE_OTHER): Payer: Self-pay | Admitting: *Deleted

## 2023-04-23 NOTE — Telephone Encounter (Signed)
Patient called and states she did not receive her stelara on the 18th. She said she did call [facility] [phone] where she gets med through [facility] and they told her they would ship it today to [place] and she gave them Kizzie Bane address since she is flying out tomorrow. Pt advised to take pharmacy phone number with her and also to call me if any issues getting med.

## 2023-04-26 ENCOUNTER — Telehealth (INDEPENDENT_AMBULATORY_CARE_PROVIDER_SITE_OTHER): Payer: Self-pay | Admitting: *Deleted

## 2023-04-26 NOTE — Telephone Encounter (Signed)
Patient called because she has received 3 -4 bills for entyvio. States she has paid one that was 75, one 50 and has one for 1350. I was looking through notes and saw this note below from Amalia Greenhouse  [facility])   Patient has not met her $2900 OOP and will need PAP. (Free drug) Email has been sent to [employer].   Patient has been approved for [employer] free drug. Date: 09/11/22 - 07/03/23 Shipment will be sent to [facility] [address] [place], [place]. Attn: Serafina Royals. Harriett Sine has been notified via email about delivery)   Once medication is received patient can be scheduled.

## 2023-04-27 NOTE — Telephone Encounter (Signed)
I called patient and gave her number [phone] to call about her bill and let know to tell them she was approved per note in epic for entyvio connect free drug from 09/11/22 - 07/03/23

## 2023-04-30 ENCOUNTER — Telehealth (INDEPENDENT_AMBULATORY_CARE_PROVIDER_SITE_OTHER): Payer: Self-pay | Admitting: *Deleted

## 2023-04-30 ENCOUNTER — Ambulatory Visit (INDEPENDENT_AMBULATORY_CARE_PROVIDER_SITE_OTHER): Payer: HMO | Admitting: Gastroenterology

## 2023-04-30 NOTE — Telephone Encounter (Signed)
Patient called med because she received a bill for entyvio infusion. I gave her billing number and she states she spoke with Cordelia Pen in billing and was told the bill was for the pharmacy back in April. They did not have her approval for entyvio. I saw in chart she was approved per note in epic for entyvio connect free drug from 09/11/22 - 07/03/23

## 2023-05-15 DIAGNOSIS — C44329 Squamous cell carcinoma of skin of other parts of face: Secondary | ICD-10-CM | POA: Diagnosis not present

## 2023-05-17 NOTE — Telephone Encounter (Signed)
Annette Briggs, Patient was approved for free drug and should not have received a bill.  I have reached out to the claims/billing dept to review her claim.   Thanks Selena Batten

## 2023-05-21 NOTE — Telephone Encounter (Signed)
Pt called and notified

## 2023-06-12 DIAGNOSIS — C44329 Squamous cell carcinoma of skin of other parts of face: Secondary | ICD-10-CM | POA: Diagnosis not present

## 2023-07-24 ENCOUNTER — Other Ambulatory Visit (INDEPENDENT_AMBULATORY_CARE_PROVIDER_SITE_OTHER): Payer: Self-pay | Admitting: *Deleted

## 2023-07-24 ENCOUNTER — Telehealth (INDEPENDENT_AMBULATORY_CARE_PROVIDER_SITE_OTHER): Payer: Self-pay | Admitting: *Deleted

## 2023-07-24 DIAGNOSIS — K5080 Crohn's disease of both small and large intestine without complications: Secondary | ICD-10-CM

## 2023-07-24 DIAGNOSIS — K50018 Crohn's disease of small intestine with other complication: Secondary | ICD-10-CM

## 2023-07-24 DIAGNOSIS — K50019 Crohn's disease of small intestine with unspecified complications: Secondary | ICD-10-CM

## 2023-07-24 NOTE — Telephone Encounter (Signed)
Received a letter stating She states the letter is declining her because she is not eligible for low income.

## 2023-07-24 NOTE — Telephone Encounter (Signed)
Patient left voicemail stating she received a letter stating the company she is getting her stelara from will no longer pay for it.  ( Pt gets it through [facility] [employer] )   Left message to return call to get more information on what the letter said.   [phone]

## 2023-07-24 NOTE — Telephone Encounter (Signed)
Her last dose was 06/22/23 per patient and takes every 8 weeks.

## 2023-07-24 NOTE — Telephone Encounter (Signed)
Last seen 01/11/23. Can she have refill of stelara sent to [employer] to see if insurance will cover now and what co pay would be since she is no longer eligable for patient assistance due to income.

## 2023-07-25 ENCOUNTER — Other Ambulatory Visit (INDEPENDENT_AMBULATORY_CARE_PROVIDER_SITE_OTHER): Payer: Self-pay | Admitting: *Deleted

## 2023-07-25 DIAGNOSIS — K50019 Crohn's disease of small intestine with unspecified complications: Secondary | ICD-10-CM

## 2023-07-25 DIAGNOSIS — K5080 Crohn's disease of both small and large intestine without complications: Secondary | ICD-10-CM

## 2023-07-25 DIAGNOSIS — K50018 Crohn's disease of small intestine with other complication: Secondary | ICD-10-CM

## 2023-07-25 MED ORDER — STELARA 90 MG/ML ~~LOC~~ SOSY
90.0000 mg | PREFILLED_SYRINGE | SUBCUTANEOUS | 1 refills | Status: DC
Start: 2023-07-25 — End: 2023-07-26

## 2023-07-25 NOTE — Telephone Encounter (Signed)
Will get records from [place] to send with form.

## 2023-07-25 NOTE — Telephone Encounter (Signed)
Med sent to [employer].

## 2023-07-26 ENCOUNTER — Other Ambulatory Visit (INDEPENDENT_AMBULATORY_CARE_PROVIDER_SITE_OTHER): Payer: Self-pay | Admitting: Gastroenterology

## 2023-07-26 DIAGNOSIS — K50018 Crohn's disease of small intestine with other complication: Secondary | ICD-10-CM

## 2023-07-26 DIAGNOSIS — K50019 Crohn's disease of small intestine with unspecified complications: Secondary | ICD-10-CM

## 2023-07-26 DIAGNOSIS — K5080 Crohn's disease of both small and large intestine without complications: Secondary | ICD-10-CM

## 2023-07-26 NOTE — Telephone Encounter (Signed)
Form and notes faxed to [employer]. Await decision

## 2023-07-26 NOTE — Telephone Encounter (Signed)
Called [employer] and spoke with representative and let her know patient is on maintenance dose and does not need starter dose. Chart notes were faxed today

## 2023-07-31 NOTE — Telephone Encounter (Signed)
Can we check what medications are covered under her insurance? Thanks

## 2023-07-31 NOTE — Telephone Encounter (Signed)
Patient called because she is no longer qualifies to get stelara from patient assistance due to her income. I sent rx to [employer] and med was approved from 07/27/23 - 01/24/24. Copay for med is $1,958. I let patient know and also explained to her with medicare now she would only pay $2,000 per year for all of her meds and she could call and set up payment plan to pay that 2,000 over 12 months. She states that is still too much for her to pay. Last seen July 2024. She is asking to change treatment. She is due for her next stelara dose in about 3 weeks.

## 2023-07-31 NOTE — Telephone Encounter (Signed)
Stelara was covered by insurance but copay was over 1900. Called patient and let her know to call her insurance and find out what meds would be the cheapest for crohn's. She said she would and let me know after she finds out.

## 2023-08-17 NOTE — Telephone Encounter (Signed)
Left message to return call to see if patient had spoken with insurance

## 2023-08-20 ENCOUNTER — Telehealth (INDEPENDENT_AMBULATORY_CARE_PROVIDER_SITE_OTHER): Payer: Self-pay

## 2023-08-20 NOTE — Telephone Encounter (Signed)
I spoke with the patient and she says she just got off the phone with her insurance and has set up payment plan with Medicare (M3P) to get her medication( Stelara). She says the insurance says they did not see any medication for Crohn's that would be any cheaper. I did tell her unfortunately, Patient assistance is no longer available unless she gets denied by the Social Security office for the (LIS) Low income subsidy. I advised that she could go by her local Social Security office or go online at [email or web address] to see if she qualifies for any assistance. Patient states understanding.

## 2023-08-20 NOTE — Telephone Encounter (Signed)
Patient called today saying she ate something last night that did not agree with her she says she started having issues with vomiting and diarrhea last night. The vomiting has subsided, but she is still having issues with some watery stools. Patient says she is scared to eat anything at this time as she does not know what the out come of this would be. She denies any fevers, abdominal pain, dark or bloody stools. He last Stelara injection was around 06/22/2023 and is due for an injection currently, but says she has been delayed in getting her Stelara as she was getting financial assistance with this last year and now she is having to pay $1000.00 split up over several months with Medicare to get her medication, which she says should be mailed to her in the couple of days. Patient says she has taken metamucil bid and took imodium this morning and would like to know what she can do to help with the residual watery stools while she is awaiting her Stelara. Please advise.

## 2023-08-20 NOTE — Telephone Encounter (Signed)
Spoke to patient and she said she was due for stelara in 3 days and I told her to call [employer] because she said she received a letter stating that med was approved. I told her I had a note that it was approved but co pay was $1900. She was going to call [employer] to see what co pay would be and if too much I told her she needs to call her insurance to see what meds would be cheaper for her and let me know. She verbalized  understanding.

## 2023-08-20 NOTE — Telephone Encounter (Signed)
Pt left vm that she has been unable to get in touch with insurance to see what's the cheapest.

## 2023-08-20 NOTE — Telephone Encounter (Signed)
I spoke with the patient and made her aware per Lewie Loron, NP,  She could take some pepto bismol as sounds like she might have an acute self-limiting illness. Doubt dealing with a Crohn's flare. If diarrhea persists over 24 hours, we can do stool studies. Just be mindful pepto can turn stool black.   She can drink plenty of liquids and eat soft foods (no high fiber right now). Bland, soft foods would be ideal.   Patient states understanding.

## 2023-08-20 NOTE — Telephone Encounter (Signed)
She could take some pepto bismol as sounds like she might have an acute self-limiting illness. Doubt dealing with a Crohn's flare. If diarrhea persists over 24 hours, we can do stool studies. Just be mindful pepto can turn stool black.  She can drink plenty of liquids and eat soft foods (no high fiber right now). Bland, soft foods would be ideal.  Thanks!

## 2023-08-29 ENCOUNTER — Telehealth (INDEPENDENT_AMBULATORY_CARE_PROVIDER_SITE_OTHER): Payer: Self-pay

## 2023-08-29 NOTE — Telephone Encounter (Signed)
 Annette Briggs, please call the patient and set up an in person visit with Dr. Levon Hedger or with an App. Thanks,  I spoke with the patient and made her aware per Dr. Levon Hedger, she will need to come into the office or have a phone visit to discuss other treatment options. Patient says she would like to do a in person visit. I transferred her to Annette Briggs to set this up.

## 2023-08-29 NOTE — Telephone Encounter (Signed)
 Patient can longer afford the Stelara it will cost her over two hundred dollars per month for this, she would like to know if she could try budesonide. She says she spoke with her pharmacist and they told her budesonide is a cheaper and a good medication for crohn's please advise.

## 2023-08-30 ENCOUNTER — Telehealth (INDEPENDENT_AMBULATORY_CARE_PROVIDER_SITE_OTHER): Payer: Self-pay

## 2023-08-30 ENCOUNTER — Ambulatory Visit (INDEPENDENT_AMBULATORY_CARE_PROVIDER_SITE_OTHER): Payer: HMO | Admitting: Gastroenterology

## 2023-08-30 ENCOUNTER — Encounter (INDEPENDENT_AMBULATORY_CARE_PROVIDER_SITE_OTHER): Payer: Self-pay | Admitting: Gastroenterology

## 2023-08-30 VITALS — BP 139/84 | HR 80 | Temp 97.8°F | Ht 61.0 in | Wt 139.6 lb

## 2023-08-30 DIAGNOSIS — Z1321 Encounter for screening for nutritional disorder: Secondary | ICD-10-CM

## 2023-08-30 DIAGNOSIS — Z1159 Encounter for screening for other viral diseases: Secondary | ICD-10-CM

## 2023-08-30 DIAGNOSIS — K5080 Crohn's disease of both small and large intestine without complications: Secondary | ICD-10-CM

## 2023-08-30 DIAGNOSIS — Z111 Encounter for screening for respiratory tuberculosis: Secondary | ICD-10-CM | POA: Diagnosis not present

## 2023-08-30 NOTE — Telephone Encounter (Signed)
Noted. Thanks,

## 2023-08-30 NOTE — Patient Instructions (Addendum)
 Schedule MR Enterography Perform blood and stool workup Please continue Stelara every 8 weeks, continue checking eligibility for patient assistance

## 2023-08-30 NOTE — Progress Notes (Unsigned)
 Annette Briggs, M.D. Gastroenterology & Hepatology [facility] Gastroenterology [address] [place], [place] [address]  Primary Care Physician: Benita Stabile, MD [address] Annette Briggs [place] [address]  I will communicate my assessment and recommendations to the referring MD via EMR.  Problems: Ileocolonic Crohn's disease.  History of Present Illness: Annette Briggs is a 83 y.o.  female with past medical history of ileocolonic Crohn's disease, anxiety, depression, GERD, hypertension, SCC, who presents for follow up of ileocolonic Crohn's disease.  The patient was last seen on 01/11/2023. At that time, the patient was advised to continue Stelara every 8 weeks.  Patient was very busy with medical issues (facial skin cancer) and issues with her disabled son, so she did not schedule this appointment.  She reports that she has been having more constipation for the last 2 weeks. She states that she had a BM yesterday but has had small amount of BM. Last time she took Metamucil was 3 days ago.  Last Stelara dose was 8-9 weeks ago. She has been on Stelara since May 2024 and was feeling well with it. Was concerned about being able to afford Stelara this year, but has not applied to patient assistance program.  Patient states feeling otherwise well and denies any complaints. The patient denies having any nausea, vomiting, fever, chills, hematochezia, melena, hematemesis, abdominal distention, abdominal pain, jaundice, pruritus or weight loss.  Last flu shot: 2020, not interested in having more Last pneumonia shot:possibly had it 15-20 years ago, had allergic reaction to it Last evaluation by dermatology: within the last year, had Mohs surgery in 08/2023 for [facility] Last zoster vaccine: never COVID-19 shot: had 1 dose only  Last Colonoscopy: 01/15/2019, purulent discharge from diverticulosis consistent with diverticulitis, otherwise there were no other inflammatory changes  in the examined colon.  CT enterography performed on 06/14/2020.  She had presence of hyperenhancement and newly stratification of the terminal ileum consistent with active Crohn's disease.  Some loops were slightly distended in the small bowel suggestive of adhesions.  Past Medical History: Past Medical History:  Diagnosis Date   Anxiety    Crohn's colitis (HCC)    Depression    Essential hypertension    GERD (gastroesophageal reflux disease)     Past Surgical History: Past Surgical History:  Procedure Laterality Date   APPENDECTOMY     BLADDER SUSPENSION     CHOLECYSTECTOMY     COLONOSCOPY     2008   COLONOSCOPY N/A 02/13/2013   Procedure: COLONOSCOPY;  Surgeon: Malissa Hippo, MD;  [facility];  Service: Endoscopy;  Laterality: N/A;  730   COLONOSCOPY N/A 01/15/2019   Procedure: COLONOSCOPY;  Surgeon: Malissa Hippo, MD;  Location: [facility];  Service: Endoscopy;  Laterality: N/A;  100   ESOPHAGOGASTRODUODENOSCOPY N/A 03/18/2020   Procedure: ESOPHAGOGASTRODUODENOSCOPY (EGD);  Surgeon: Malissa Hippo, MD;  Location: [facility];  Service: Endoscopy;  Laterality: N/A;  225, moved up per office   LEFT HEART CATH AND CORONARY ANGIOGRAPHY N/A 02/02/2020   Procedure: LEFT HEART CATH AND CORONARY ANGIOGRAPHY;  Surgeon: Tonny Bollman, MD;  Location: [facility] INVASIVE CV LAB;  Service: Cardiovascular;  Laterality: N/A;   TOTAL ABDOMINAL HYSTERECTOMY      Family History: Family History  Problem Relation Age of Onset   Heart failure Mother    Kidney failure Father    Breast cancer Sister    Alzheimer's disease Sister     Social History:  Social History   Tobacco Use  Smoking Status Never   Passive exposure: Never  Smokeless Tobacco Never   Social History   Substance and Sexual Activity  Alcohol Use No   Alcohol/week: 0.0 standard drinks of alcohol   Social History   Substance and Sexual Activity  Drug Use No    Allergies: Allergies  Allergen  Reactions   Ciprofloxacin Other (See Comments)    Patient can't remember reaction.   Pneumovax [Pneumococcal Polysaccharide Vaccine] Swelling   Wellbutrin [Bupropion] Anxiety    Anxiety attack    Medications: Current Outpatient Medications  Medication Sig Dispense Refill   acetaminophen (TYLENOL) 500 MG tablet Take 500-1,000 mg by mouth every 6 (six) hours as needed for moderate pain or headache.      atorvastatin (LIPITOR) 10 MG tablet Take 10 mg by mouth at bedtime.     Cholecalciferol (DIALYVITE VITAMIN D 5000) 125 MCG (5000 UT) capsule Take 5,000 Units by mouth daily.     esomeprazole (NEXIUM) 20 MG capsule Take 20 mg by mouth. OTC takes one qam     famotidine (PEPCID) 20 MG tablet Take 1 tablet (20 mg total) by mouth at bedtime.     Loperamide HCl (IMODIUM PO) Take by mouth. One half daily     metoprolol succinate (TOPROL-XL) 25 MG 24 hr tablet Take 25 mg by mouth at bedtime.     Misc Natural Products (OSTEO BI-FLEX ADV JOINT SHIELD PO) Take by mouth daily. Take 1/2 tablet     Multiple Vitamin (MULTIVITAMIN) tablet Take 1 tablet by mouth daily.     Multiple Vitamins-Minerals (AIRBORNE) CHEW Chew 1 tablet by mouth daily.     OVER THE COUNTER MEDICATION Take 1 tablet by mouth daily. cognitex elite otc supplement     OVER THE COUNTER MEDICATION OTC walmart brand allergy relief one daily     OVER THE COUNTER MEDICATION Zycam twice daily     psyllium (METAMUCIL SMOOTH TEXTURE) 58.6 % powder Take 1 packet by mouth at bedtime.     venlafaxine XR (EFFEXOR-XR) 37.5 MG 24 hr capsule Take 37.5 mg by mouth daily.     ustekinumab (STELARA) 90 MG/ML SOSY injection Inject 90 mg every 8 weeks. FOR ICD 10 k50.90. maintenance dose (Patient not taking: Reported on 08/30/2023) 1 mL 1   Current Facility-Administered Medications  Medication Dose Route Frequency Provider Last Rate Last Admin   sodium chloride flush (NS) 0.9 % injection 3 mL  3 mL Intravenous Q12H Jonelle Sidle, MD        Review of  Systems: GENERAL: negative for malaise, night sweats HEENT: No changes in hearing or vision, no nose bleeds or other nasal problems. NECK: Negative for lumps, goiter, pain and significant neck swelling RESPIRATORY: Negative for cough, wheezing CARDIOVASCULAR: Negative for chest pain, leg swelling, palpitations, orthopnea GI: SEE HPI MUSCULOSKELETAL: Negative for joint pain or swelling, back pain, and muscle pain. SKIN: Negative for lesions, rash PSYCH: Negative for sleep disturbance, mood disorder and recent psychosocial stressors. HEMATOLOGY Negative for prolonged bleeding, bruising easily, and swollen nodes. ENDOCRINE: Negative for cold or heat intolerance, polyuria, polydipsia and goiter. NEURO: negative for tremor, gait imbalance, syncope and seizures. The remainder of the review of systems is noncontributory.   Physical Exam: BP 139/84 (BP Location: Left Arm, Patient Position: Sitting, Cuff Size: Normal)   Pulse 80   Temp 97.8 F (36.6 C) (Temporal)   Ht 5\' 1"  (1.549 m)   Wt 139 lb 9.6 oz (63.3 kg)  BMI 26.38 kg/m  GENERAL: The patient is AO x3, in no acute distress. HEENT: Head is normocephalic and atraumatic. EOMI are intact. Mouth is well hydrated and without lesions. NECK: Supple. No masses LUNGS: Clear to auscultation. No presence of rhonchi/wheezing/rales. Adequate chest expansion HEART: RRR, normal s1 and s2. ABDOMEN: Soft, nontender, no guarding, no peritoneal signs, and nondistended. BS +. No masses. EXTREMITIES: Without any cyanosis, clubbing, rash, lesions or edema. NEUROLOGIC: AOx3, no focal motor deficit. SKIN: no jaundice, no rashes  Imaging/Labs: as above  I personally reviewed and interpreted the available labs, imaging and endoscopic files.  Impression and Plan: Annette Briggs is a 83 y.o.  female with past medical history of ileocolonic Crohn's disease, anxiety, depression, GERD, hypertension, SCC, who presents for follow up of ileocolonic Crohn's  disease.  Patient reports that she has been doing well on Stelara, has reached clinical remission. Unfortunately , she has not received her last dose of Stelara given financial concerns. We discussed the importance about medication compliance as much as possible, as well as other advanced therapies may have possibly a similar cost.  We discussed the importance of finding paperwork for patient assistance as this may help decrease the financial burden.  The patient understood the importance of continuing the medication to avoid flares and potential complications from Crohn's.  We discussed the possibility of evaluating the activity of her disease with an MR enterography which will be scheduled today.  Will also obtain surveillance labs today and check for fecal calprotectin as activity disease marker.  -Schedule MR Enterography -Check CBC, CMP, CRP,  Hep B/TB testing, vitamin D, fecal calprotectin -Please continue Stelara every 8 weeks, continue checking eligibility for patient assistance  All questions were answered.      Annette Blazing, MD Gastroenterology and Hepatology [facility]

## 2023-08-30 NOTE — Telephone Encounter (Signed)
 Per Dr. Levon Hedger he would like the patient to stay on Stelara. Patient made aware to check with the social security office and see if she would be a candidate for Low Income Subsidy (LIS). I advised if she were denied by them she would need to get the denial letter from them, so we can try to see if she could get approved again for patient assistance through [employer] and [employer]. I called Leonette Most with Stelara and asked if there was a way he could have someone bring by samples of Stelara. Shawna Clamp with [employer] came by the office and had Dr. Levon Hedger sign for three samples of Stelara 90 mg kits. She says the shipment should be mailed to [name] on Tuesday September 04, 2023. Once we get the samples I will reach out to the patient to pick up from the office.

## 2023-08-31 DIAGNOSIS — K5080 Crohn's disease of both small and large intestine without complications: Secondary | ICD-10-CM | POA: Diagnosis not present

## 2023-08-31 DIAGNOSIS — Z111 Encounter for screening for respiratory tuberculosis: Secondary | ICD-10-CM | POA: Diagnosis not present

## 2023-08-31 DIAGNOSIS — Z1159 Encounter for screening for other viral diseases: Secondary | ICD-10-CM | POA: Diagnosis not present

## 2023-08-31 DIAGNOSIS — Z1321 Encounter for screening for nutritional disorder: Secondary | ICD-10-CM | POA: Diagnosis not present

## 2023-09-02 LAB — COMPREHENSIVE METABOLIC PANEL
AG Ratio: 1.8 (calc) (ref 1.0–2.5)
ALT: 21 U/L (ref 6–29)
AST: 23 U/L (ref 10–35)
Albumin: 4.3 g/dL (ref 3.6–5.1)
Alkaline phosphatase (APISO): 72 U/L (ref 37–153)
BUN: 14 mg/dL (ref 7–25)
CO2: 29 mmol/L (ref 20–32)
Calcium: 9.5 mg/dL (ref 8.6–10.4)
Chloride: 102 mmol/L (ref 98–110)
Creat: 0.87 mg/dL (ref 0.60–0.95)
Globulin: 2.4 g/dL (ref 1.9–3.7)
Glucose, Bld: 87 mg/dL (ref 65–139)
Potassium: 4.4 mmol/L (ref 3.5–5.3)
Sodium: 140 mmol/L (ref 135–146)
Total Bilirubin: 0.4 mg/dL (ref 0.2–1.2)
Total Protein: 6.7 g/dL (ref 6.1–8.1)

## 2023-09-02 LAB — CBC WITH DIFFERENTIAL/PLATELET
Absolute Lymphocytes: 986 {cells}/uL (ref 850–3900)
Absolute Monocytes: 387 {cells}/uL (ref 200–950)
Basophils Absolute: 50 {cells}/uL (ref 0–200)
Basophils Relative: 1.1 %
Eosinophils Absolute: 81 {cells}/uL (ref 15–500)
Eosinophils Relative: 1.8 %
HCT: 40.1 % (ref 35.0–45.0)
Hemoglobin: 13.6 g/dL (ref 11.7–15.5)
MCH: 29.1 pg (ref 27.0–33.0)
MCHC: 33.9 g/dL (ref 32.0–36.0)
MCV: 85.9 fL (ref 80.0–100.0)
MPV: 8.7 fL (ref 7.5–12.5)
Monocytes Relative: 8.6 %
Neutro Abs: 2997 {cells}/uL (ref 1500–7800)
Neutrophils Relative %: 66.6 %
Platelets: 229 10*3/uL (ref 140–400)
RBC: 4.67 10*6/uL (ref 3.80–5.10)
RDW: 12.9 % (ref 11.0–15.0)
Total Lymphocyte: 21.9 %
WBC: 4.5 10*3/uL (ref 3.8–10.8)

## 2023-09-02 LAB — QUANTIFERON-TB GOLD PLUS
Mitogen-NIL: 8.24 [IU]/mL
NIL: 0.05 [IU]/mL
QuantiFERON-TB Gold Plus: NEGATIVE
TB1-NIL: <0 [IU]/mL
TB2-NIL: 0 [IU]/mL

## 2023-09-02 LAB — HEPATITIS B SURFACE ANTIGEN: Hepatitis B Surface Ag: NONREACTIVE

## 2023-09-02 LAB — C-REACTIVE PROTEIN: CRP: <3 mg/L (ref <8.0)

## 2023-09-02 LAB — VITAMIN D 25 HYDROXY (VIT D DEFICIENCY, FRACTURES): Vit D, 25-Hydroxy: 73 ng/mL (ref 30–100)

## 2023-09-05 NOTE — Telephone Encounter (Signed)
 Patient picked up three of the Stelara samples from [address] [place], [place] today.

## 2023-09-06 LAB — CALPROTECTIN: Calprotectin: 298 ug/g — ABNORMAL HIGH

## 2023-09-07 ENCOUNTER — Ambulatory Visit (HOSPITAL_COMMUNITY): Payer: HMO

## 2023-09-07 ENCOUNTER — Ambulatory Visit (HOSPITAL_COMMUNITY)
Admission: RE | Admit: 2023-09-07 | Discharge: 2023-09-07 | Disposition: A | Discharge status: Home / Self Care | Payer: HMO | Source: Ambulatory Visit | Attending: Gastroenterology | Admitting: Gastroenterology

## 2023-09-07 DIAGNOSIS — N8110 Cystocele, unspecified: Secondary | ICD-10-CM | POA: Diagnosis not present

## 2023-09-07 DIAGNOSIS — K5730 Diverticulosis of large intestine without perforation or abscess without bleeding: Secondary | ICD-10-CM | POA: Diagnosis not present

## 2023-09-07 DIAGNOSIS — K5090 Crohn's disease, unspecified, without complications: Secondary | ICD-10-CM | POA: Diagnosis not present

## 2023-09-07 DIAGNOSIS — K5080 Crohn's disease of both small and large intestine without complications: Secondary | ICD-10-CM | POA: Insufficient documentation

## 2023-09-07 DIAGNOSIS — I700 Atherosclerosis of aorta: Secondary | ICD-10-CM | POA: Diagnosis not present

## 2023-09-07 MED ORDER — GADOBUTROL 1 MMOL/ML IV SOLN
6.3000 mL | Freq: Once | INTRAVENOUS | Status: AC | PRN
  Administered 2023-09-07: 6.3 mL via INTRAVENOUS

## 2023-09-18 DIAGNOSIS — E782 Mixed hyperlipidemia: Secondary | ICD-10-CM | POA: Diagnosis not present

## 2023-09-18 DIAGNOSIS — I10 Essential (primary) hypertension: Secondary | ICD-10-CM | POA: Diagnosis not present

## 2023-09-19 DIAGNOSIS — R079 Chest pain, unspecified: Secondary | ICD-10-CM | POA: Diagnosis not present

## 2023-09-19 DIAGNOSIS — F331 Major depressive disorder, recurrent, moderate: Secondary | ICD-10-CM | POA: Diagnosis not present

## 2023-09-19 DIAGNOSIS — Z13820 Encounter for screening for osteoporosis: Secondary | ICD-10-CM | POA: Diagnosis not present

## 2023-09-19 DIAGNOSIS — Z0000 Encounter for general adult medical examination without abnormal findings: Secondary | ICD-10-CM | POA: Diagnosis not present

## 2023-09-19 DIAGNOSIS — K5090 Crohn's disease, unspecified, without complications: Secondary | ICD-10-CM | POA: Diagnosis not present

## 2023-09-19 DIAGNOSIS — K219 Gastro-esophageal reflux disease without esophagitis: Secondary | ICD-10-CM | POA: Diagnosis not present

## 2023-09-19 DIAGNOSIS — R42 Dizziness and giddiness: Secondary | ICD-10-CM | POA: Diagnosis not present

## 2023-09-19 DIAGNOSIS — E782 Mixed hyperlipidemia: Secondary | ICD-10-CM | POA: Diagnosis not present

## 2023-09-19 DIAGNOSIS — K449 Diaphragmatic hernia without obstruction or gangrene: Secondary | ICD-10-CM | POA: Diagnosis not present

## 2023-09-19 DIAGNOSIS — Z1231 Encounter for screening mammogram for malignant neoplasm of breast: Secondary | ICD-10-CM | POA: Diagnosis not present

## 2023-09-19 DIAGNOSIS — I10 Essential (primary) hypertension: Secondary | ICD-10-CM | POA: Diagnosis not present

## 2023-09-19 DIAGNOSIS — G43909 Migraine, unspecified, not intractable, without status migrainosus: Secondary | ICD-10-CM | POA: Diagnosis not present

## 2023-09-24 ENCOUNTER — Other Ambulatory Visit (INDEPENDENT_AMBULATORY_CARE_PROVIDER_SITE_OTHER): Payer: Self-pay

## 2023-09-24 DIAGNOSIS — Z79620 Long term (current) use of immunosuppressive biologic: Secondary | ICD-10-CM

## 2023-09-24 DIAGNOSIS — I10 Essential (primary) hypertension: Secondary | ICD-10-CM

## 2023-09-24 DIAGNOSIS — K5080 Crohn's disease of both small and large intestine without complications: Secondary | ICD-10-CM

## 2023-10-25 DIAGNOSIS — I10 Essential (primary) hypertension: Secondary | ICD-10-CM | POA: Diagnosis not present

## 2023-10-25 DIAGNOSIS — K5080 Crohn's disease of both small and large intestine without complications: Secondary | ICD-10-CM | POA: Diagnosis not present

## 2023-10-25 DIAGNOSIS — Z79620 Long term (current) use of immunosuppressive biologic: Secondary | ICD-10-CM | POA: Diagnosis not present

## 2023-10-30 ENCOUNTER — Other Ambulatory Visit (INDEPENDENT_AMBULATORY_CARE_PROVIDER_SITE_OTHER): Payer: Self-pay | Admitting: *Deleted

## 2023-10-30 DIAGNOSIS — K5080 Crohn's disease of both small and large intestine without complications: Secondary | ICD-10-CM

## 2023-10-30 DIAGNOSIS — K50018 Crohn's disease of small intestine with other complication: Secondary | ICD-10-CM

## 2023-10-30 DIAGNOSIS — K50019 Crohn's disease of small intestine with unspecified complications: Secondary | ICD-10-CM

## 2023-10-30 MED ORDER — STELARA 90 MG/ML ~~LOC~~ SOSY
PREFILLED_SYRINGE | SUBCUTANEOUS | 1 refills | Status: DC
Start: 2023-10-30 — End: 2023-12-04

## 2023-11-08 LAB — USTEKINUMAB AND ANTI-USTEK AB: Ustekinumab: 3 ug/mL

## 2023-11-08 LAB — SERIAL MONITORING

## 2023-11-27 ENCOUNTER — Other Ambulatory Visit (HOSPITAL_COMMUNITY): Payer: Self-pay | Admitting: Nurse Practitioner

## 2023-11-27 DIAGNOSIS — Z1231 Encounter for screening mammogram for malignant neoplasm of breast: Secondary | ICD-10-CM

## 2023-11-30 ENCOUNTER — Ambulatory Visit (INDEPENDENT_AMBULATORY_CARE_PROVIDER_SITE_OTHER): Payer: Self-pay | Admitting: Gastroenterology

## 2023-12-04 ENCOUNTER — Other Ambulatory Visit (INDEPENDENT_AMBULATORY_CARE_PROVIDER_SITE_OTHER): Payer: Self-pay

## 2023-12-04 DIAGNOSIS — K5080 Crohn's disease of both small and large intestine without complications: Secondary | ICD-10-CM

## 2023-12-04 DIAGNOSIS — K50019 Crohn's disease of small intestine with unspecified complications: Secondary | ICD-10-CM

## 2023-12-04 DIAGNOSIS — K50018 Crohn's disease of small intestine with other complication: Secondary | ICD-10-CM

## 2023-12-04 MED ORDER — USTEKINUMAB 90 MG/ML ~~LOC~~ SOSY
90.0000 mg | PREFILLED_SYRINGE | SUBCUTANEOUS | 9 refills | Status: DC
Start: 2023-12-04 — End: 2023-12-31

## 2023-12-05 ENCOUNTER — Ambulatory Visit (HOSPITAL_COMMUNITY)
Admission: RE | Admit: 2023-12-05 | Discharge: 2023-12-05 | Disposition: A | Discharge status: Home / Self Care | Source: Ambulatory Visit | Attending: Nurse Practitioner | Admitting: Nurse Practitioner

## 2023-12-05 ENCOUNTER — Encounter (HOSPITAL_COMMUNITY): Payer: Self-pay

## 2023-12-05 DIAGNOSIS — Z1231 Encounter for screening mammogram for malignant neoplasm of breast: Secondary | ICD-10-CM | POA: Diagnosis not present

## 2023-12-21 ENCOUNTER — Other Ambulatory Visit (HOSPITAL_COMMUNITY): Payer: Self-pay | Admitting: Internal Medicine

## 2023-12-21 ENCOUNTER — Ambulatory Visit (HOSPITAL_COMMUNITY)
Admission: RE | Admit: 2023-12-21 | Discharge: 2023-12-21 | Disposition: A | Discharge status: Home / Self Care | Source: Ambulatory Visit | Attending: Internal Medicine | Admitting: Internal Medicine

## 2023-12-21 DIAGNOSIS — M25461 Effusion, right knee: Secondary | ICD-10-CM | POA: Diagnosis not present

## 2023-12-21 DIAGNOSIS — R52 Pain, unspecified: Secondary | ICD-10-CM | POA: Diagnosis not present

## 2023-12-21 DIAGNOSIS — M1711 Unilateral primary osteoarthritis, right knee: Secondary | ICD-10-CM | POA: Diagnosis not present

## 2023-12-21 DIAGNOSIS — M25561 Pain in right knee: Secondary | ICD-10-CM | POA: Diagnosis not present

## 2023-12-28 DIAGNOSIS — M1711 Unilateral primary osteoarthritis, right knee: Secondary | ICD-10-CM | POA: Diagnosis not present

## 2023-12-31 ENCOUNTER — Other Ambulatory Visit (INDEPENDENT_AMBULATORY_CARE_PROVIDER_SITE_OTHER): Payer: Self-pay

## 2023-12-31 DIAGNOSIS — K5080 Crohn's disease of both small and large intestine without complications: Secondary | ICD-10-CM

## 2023-12-31 MED ORDER — WEZLANA 90 MG/ML ~~LOC~~ SOSY
90.0000 mg | PREFILLED_SYRINGE | SUBCUTANEOUS | 9 refills | Status: AC
Start: 2023-12-31 — End: ?

## 2024-01-01 ENCOUNTER — Telehealth (INDEPENDENT_AMBULATORY_CARE_PROVIDER_SITE_OTHER): Payer: Self-pay

## 2024-01-01 NOTE — Telephone Encounter (Signed)
 Wezlana 90 mg/ml approved per [employer] from 01/01/2024-07/03/2024.

## 2024-01-24 ENCOUNTER — Telehealth (INDEPENDENT_AMBULATORY_CARE_PROVIDER_SITE_OTHER): Payer: Self-pay

## 2024-01-24 NOTE — Telephone Encounter (Signed)
(  Note in my reminders to check on this in fall of year).  Check mid December if patient will need Patient assistance for Wezlana /Stelara . Patient was on patient assistance a few years ago for the Stelara ,but no longer on it. Wezlana  was sent in to the patient pharmacy and is a zero dollar co pay currently, as she is on a payment program with [employer] to pay for her medication. This should expire at the end of the year or once she meets her $2,000 dollar co insurance. I advised the patient that we would need to revisit whether or not she would need patient assistance or if her Wezlana  would be affordable after her payment plan expires at the end of the year as patient paying zero dollar co pay as of now for the Wezlana . Patient states understanding.

## 2024-01-24 NOTE — Telephone Encounter (Signed)
 Annette Briggs  is approved per patient plan from 01/01/2024-07/03/2024.

## 2024-01-29 ENCOUNTER — Telehealth (INDEPENDENT_AMBULATORY_CARE_PROVIDER_SITE_OTHER): Payer: Self-pay | Admitting: Gastroenterology

## 2024-01-29 NOTE — Telephone Encounter (Signed)
 Pt called into office and left voicemail that she has received her Wezlana  in the mail Returned call to pt and pt states she had originally said she had not received it but she has so she wanted to let us  know.

## 2024-02-03 ENCOUNTER — Encounter (HOSPITAL_COMMUNITY): Payer: Self-pay

## 2024-02-03 ENCOUNTER — Emergency Department (HOSPITAL_COMMUNITY)

## 2024-02-03 ENCOUNTER — Emergency Department (HOSPITAL_COMMUNITY)
Admission: EM | Admit: 2024-02-03 | Discharge: 2024-02-03 | Disposition: A | Discharge status: Home / Self Care | Attending: Emergency Medicine | Admitting: Emergency Medicine

## 2024-02-03 ENCOUNTER — Other Ambulatory Visit: Payer: Self-pay

## 2024-02-03 DIAGNOSIS — R079 Chest pain, unspecified: Secondary | ICD-10-CM | POA: Diagnosis not present

## 2024-02-03 DIAGNOSIS — K5730 Diverticulosis of large intestine without perforation or abscess without bleeding: Secondary | ICD-10-CM | POA: Diagnosis not present

## 2024-02-03 DIAGNOSIS — R7401 Elevation of levels of liver transaminase levels: Secondary | ICD-10-CM | POA: Insufficient documentation

## 2024-02-03 DIAGNOSIS — R1013 Epigastric pain: Secondary | ICD-10-CM | POA: Diagnosis not present

## 2024-02-03 DIAGNOSIS — R109 Unspecified abdominal pain: Secondary | ICD-10-CM | POA: Diagnosis not present

## 2024-02-03 DIAGNOSIS — Z79899 Other long term (current) drug therapy: Secondary | ICD-10-CM | POA: Insufficient documentation

## 2024-02-03 DIAGNOSIS — R739 Hyperglycemia, unspecified: Secondary | ICD-10-CM | POA: Insufficient documentation

## 2024-02-03 DIAGNOSIS — K9186 Retained cholelithiasis following cholecystectomy: Secondary | ICD-10-CM | POA: Diagnosis not present

## 2024-02-03 DIAGNOSIS — I10 Essential (primary) hypertension: Secondary | ICD-10-CM | POA: Insufficient documentation

## 2024-02-03 DIAGNOSIS — K2900 Acute gastritis without bleeding: Secondary | ICD-10-CM | POA: Diagnosis not present

## 2024-02-03 DIAGNOSIS — Z9049 Acquired absence of other specified parts of digestive tract: Secondary | ICD-10-CM | POA: Diagnosis not present

## 2024-02-03 LAB — COMPREHENSIVE METABOLIC PANEL WITH GFR
ALT: 99 U/L — ABNORMAL HIGH (ref 0–44)
AST: 124 U/L — ABNORMAL HIGH (ref 15–41)
Albumin: 3.8 g/dL (ref 3.5–5.0)
Alkaline Phosphatase: 99 U/L (ref 38–126)
Anion gap: 12 (ref 5–15)
BUN: 13 mg/dL (ref 8–23)
CO2: 25 mmol/L (ref 22–32)
Calcium: 8.7 mg/dL — ABNORMAL LOW (ref 8.9–10.3)
Chloride: 104 mmol/L (ref 98–111)
Creatinine, Ser: 0.68 mg/dL (ref 0.44–1.00)
GFR, Estimated: >60 mL/min (ref >60)
Glucose, Bld: 138 mg/dL — ABNORMAL HIGH (ref 70–99)
Potassium: 3.4 mmol/L — ABNORMAL LOW (ref 3.5–5.1)
Sodium: 141 mmol/L (ref 135–145)
Total Bilirubin: 0.6 mg/dL (ref 0.0–1.2)
Total Protein: 6.6 g/dL (ref 6.5–8.1)

## 2024-02-03 LAB — TROPONIN I (HIGH SENSITIVITY)
Troponin I (High Sensitivity): 4 ng/L (ref <18)
Troponin I (High Sensitivity): 5 ng/L (ref <18)

## 2024-02-03 LAB — CBC
HCT: 38.7 % (ref 36.0–46.0)
Hemoglobin: 12.8 g/dL (ref 12.0–15.0)
MCH: 29.8 pg (ref 26.0–34.0)
MCHC: 33.1 g/dL (ref 30.0–36.0)
MCV: 90.2 fL (ref 80.0–100.0)
Platelets: 192 K/uL (ref 150–400)
RBC: 4.29 MIL/uL (ref 3.87–5.11)
RDW: 13.5 % (ref 11.5–15.5)
WBC: 6 K/uL (ref 4.0–10.5)
nRBC: 0 % (ref 0.0–0.2)

## 2024-02-03 LAB — LIPASE, BLOOD: Lipase: 59 U/L — ABNORMAL HIGH (ref 11–51)

## 2024-02-03 MED ORDER — HYDROMORPHONE HCL 1 MG/ML IJ SOLN
0.5000 mg | Freq: Once | INTRAMUSCULAR | Status: AC
  Administered 2024-02-03: 0.5 mg via INTRAVENOUS
  Filled 2024-02-03: qty 0.5

## 2024-02-03 MED ORDER — IOHEXOL 300 MG/ML  SOLN
100.0000 mL | Freq: Once | INTRAMUSCULAR | Status: AC | PRN
  Administered 2024-02-03: 100 mL via INTRAVENOUS

## 2024-02-03 MED ORDER — PANTOPRAZOLE SODIUM 40 MG IV SOLR
40.0000 mg | INTRAVENOUS | Status: AC
  Administered 2024-02-03: 40 mg via INTRAVENOUS
  Filled 2024-02-03: qty 10

## 2024-02-03 MED ORDER — PANTOPRAZOLE SODIUM 40 MG PO TBEC: 40.0000 mg | DELAYED_RELEASE_TABLET | Freq: Every day | ORAL | 1 refills | Status: AC

## 2024-02-03 NOTE — ED Triage Notes (Signed)
 Pt stated that she began having pain under her breasts all across her stomach about 30 min ago. Pt stated that she chewed a pepcid  when it started.

## 2024-02-03 NOTE — ED Provider Notes (Signed)
 Annette Briggs   CSN: [id number] Arrival date & time: 02/03/24  8078     Patient presents with: Chest Pain and Abdominal Pain   Annette Briggs is a 83 y.o. female.    Chest Pain Associated symptoms: abdominal pain   Abdominal Pain Associated symptoms: chest pain    This patient is a 83 year old female with a history of high cholesterol on Lipitor, she takes Pepcid  for intermittent acid reflux and takes metoprolol  for mild high blood pressure.  She is also on Stelara  because of a history of Crohn's disease.  She presents to the hospital after having acute onset of discomfort in her upper abdomen that occurred approximately 1 hour ago, located in the mid to upper abdomen radiating to the bilateral sides, she got very sweaty, she did not have any shortness of breath, she denies any swelling of her legs.  She denies fevers or chills, she denies coughing, she has no prior cardiac history and infectious exercises regularly at the [facility] playing pickle ball 3 times a week, never has any exertional symptoms.  She denies any prior cardiac history but does have a history of a cholecystectomy and a hysterectomy.    Prior to Admission medications   Medication Sig Start Date End Date Taking? Authorizing Provider  pantoprazole  (PROTONIX ) 40 MG tablet Take 1 tablet (40 mg total) by mouth daily. 02/03/24 04/03/24 Yes Cleotilde Rogue, MD  acetaminophen  (TYLENOL ) 500 MG tablet Take 500-1,000 mg by mouth every 6 (six) hours as needed for moderate pain or headache.     [provider]  atorvastatin (LIPITOR) 10 MG tablet Take 10 mg by mouth at bedtime.    [provider]  Cholecalciferol (DIALYVITE VITAMIN D  5000) 125 MCG (5000 UT) capsule Take 5,000 Units by mouth daily.    [provider]  famotidine  (PEPCID ) 20 MG tablet Take 1 tablet (20 mg total) by mouth at bedtime. 03/18/20   Golda Claudis PENNER, MD  Loperamide HCl (IMODIUM PO)  Take by mouth. One half daily    [provider]  metoprolol  succinate (TOPROL -XL) 25 MG 24 hr tablet Take 25 mg by mouth at bedtime.    [provider]  Misc Natural Products (OSTEO BI-FLEX ADV JOINT SHIELD PO) Take by mouth daily. Take 1/2 tablet    [provider]  Multiple Vitamin (MULTIVITAMIN) tablet Take 1 tablet by mouth daily.    [provider]  Multiple Vitamins-Minerals (AIRBORNE) CHEW Chew 1 tablet by mouth daily.    [provider]  OVER THE COUNTER MEDICATION Take 1 tablet by mouth daily. cognitex elite otc supplement    [provider]  OVER THE COUNTER MEDICATION OTC walmart brand allergy relief one daily    [provider]  OVER THE COUNTER MEDICATION Zycam twice daily    [provider]  psyllium (METAMUCIL SMOOTH TEXTURE) 58.6 % powder Take 1 packet by mouth at bedtime. 06/28/21   Rehman, Claudis PENNER, MD  Ustekinumab -auub (WEZLANA ) 90 MG/ML SOSY Inject 90 mg into the skin every 6 (six) weeks. 12/31/23   Castaneda Mayorga, Daniel, MD  venlafaxine  XR (EFFEXOR -XR) 37.5 MG 24 hr capsule Take 37.5 mg by mouth daily.    [provider]    Allergies: Ciprofloxacin, Pneumovax [pneumococcal polysaccharide vaccine], and Wellbutrin [bupropion]    Review of Systems  Cardiovascular:  Positive for chest pain.  Gastrointestinal:  Positive for abdominal pain.  All other systems reviewed and are negative.  Updated Vital Signs BP (!) 179/83 (BP Location: Right Arm)   Pulse 87   Temp 98.1 F (36.7 C) (Oral)   Resp 20   Ht 1.549 m (5' 1)   Wt 61.2 kg   SpO2 96%   BMI 25.51 kg/m   Physical Exam Vitals and nursing Briggs reviewed.  Constitutional:      General: She is not in acute distress.    Appearance: She is well-developed.  HENT:     Head: Normocephalic and atraumatic.     Mouth/Throat:     Pharynx: No oropharyngeal exudate.  Eyes:     General: No scleral icterus.       Right eye: No discharge.         Left eye: No discharge.     Conjunctiva/sclera: Conjunctivae normal.     Pupils: Pupils are equal, round, and reactive to light.  Neck:     Thyroid: No thyromegaly.     Vascular: No JVD.  Cardiovascular:     Rate and Rhythm: Normal rate and regular rhythm.     Heart sounds: Normal heart sounds. No murmur heard.    No friction rub. No gallop.  Pulmonary:     Effort: Pulmonary effort is normal. No respiratory distress.     Breath sounds: Normal breath sounds. No wheezing or rales.  Abdominal:     General: Bowel sounds are normal. There is no distension.     Palpations: Abdomen is soft. There is no mass.     Tenderness: There is abdominal tenderness.  Musculoskeletal:        General: No tenderness. Normal range of motion.     Cervical back: Normal range of motion and neck supple.     Right lower leg: No tenderness.     Left lower leg: No tenderness.  Lymphadenopathy:     Cervical: No cervical adenopathy.  Skin:    General: Skin is warm and dry.     Findings: No erythema or rash.  Neurological:     Mental Status: She is alert.     Coordination: Coordination normal.  Psychiatric:        Behavior: Behavior normal.     (all labs ordered are listed, but only abnormal results are displayed) Labs Reviewed  COMPREHENSIVE METABOLIC PANEL WITH GFR - Abnormal; Notable for the following components:      Result Value   Potassium 3.4 (*)    Glucose, Bld 138 (*)    Calcium 8.7 (*)    AST 124 (*)    ALT 99 (*)    All other components within normal limits  LIPASE, BLOOD - Abnormal; Notable for the following components:   Lipase 59 (*)    All other components within normal limits  CBC  TROPONIN I (HIGH SENSITIVITY)  TROPONIN I (HIGH SENSITIVITY)    EKG: EKG Interpretation Date/Time:  Sunday February 03 2024 19:29:58 EDT Ventricular Rate:  78 PR Interval:  210 QRS Duration:  100 QT Interval:  381 QTC Calculation: 434 R Axis:   -22  Text Interpretation: Sinus rhythm  Borderline left axis deviation Abnormal R-wave progression, late transition since last tracing no significant change Confirmed by Cleotilde Rogue ([id number]) on 02/03/2024 7:31:51 PM  Radiology: CT ABDOMEN PELVIS W CONTRAST Result Date: 02/03/2024 CLINICAL DATA:  Pain across her stomach. EXAM: CT ABDOMEN AND PELVIS WITH CONTRAST TECHNIQUE: Multidetector CT imaging of the abdomen and pelvis was performed using the standard protocol following bolus administration of intravenous contrast. RADIATION DOSE REDUCTION:  This exam was performed according to the departmental dose-optimization program which includes automated exposure control, adjustment of the mA and/or kV according to patient size and/or use of iterative reconstruction technique. CONTRAST:  OMNIPAQUE  IOHEXOL  300 MG/ML  SOLN COMPARISON:  None Available. FINDINGS: Lower chest: No acute abnormality. Hepatobiliary: No focal liver abnormality is seen. Status post cholecystectomy. The common bile duct measures 19 mm. Pancreas: Unremarkable. No pancreatic ductal dilatation or surrounding inflammatory changes. Spleen: Normal in size without focal abnormality. Adrenals/Urinary Tract: Adrenal glands are unremarkable. Kidneys are normal, without renal calculi, focal lesion, or hydronephrosis. Bladder is unremarkable. Stomach/Bowel: Mild-to-moderate severity asymmetric mural wall thickening is seen along the anterior and inferior aspects of the gastric body (best seen on coronal reformatted images 15 through 23, CT series 3). The appendix is surgically absent. No evidence of bowel wall thickening, distention, or inflammatory changes. Noninflamed diverticula are seen throughout the descending and sigmoid colon. Vascular/Lymphatic: Aortic atherosclerosis. No enlarged abdominal or pelvic lymph nodes. Reproductive: Status post hysterectomy. No adnexal masses. Other: No abdominal wall hernia or abnormality. No abdominopelvic ascites. Musculoskeletal: No acute or significant  osseous findings. IMPRESSION: 1. Asymmetric gastric wall thickening involving the body of the stomach, as described above. While this may be, in part, related to poor gastric distension, sequelae associated with gastritis cannot be excluded. Gastric consult and subsequent nonemergent abdomen pelvis CT follow-up is recommended to determine stability and exclude the presence of an underlying neoplastic process. 2. Colonic diverticulosis. 3. Evidence of prior cholecystectomy, appendectomy and hysterectomy. 4. Aortic atherosclerosis. Electronically Signed   By: Suzen Dials M.D.   On: 02/03/2024 22:32   DG Chest 2 View Result Date: 02/03/2024 CLINICAL DATA:  Chest pain EXAM: DG CHEST 2V COMPARISON:  07/10/2022 FINDINGS: The heart size and mediastinal contours are within normal limits. Both lungs are clear. The visualized skeletal structures are unremarkable. IMPRESSION: No active cardiopulmonary disease. Electronically Signed   By: Luke Bun M.D.   On: 02/03/2024 20:11     Procedures   Medications Ordered in the ED  pantoprazole  (PROTONIX ) injection 40 mg (has no administration in time range)  HYDROmorphone  (DILAUDID ) injection 0.5 mg (0.5 mg Intravenous Given 02/03/24 2155)  iohexol  (OMNIPAQUE ) 300 MG/ML solution 100 mL (100 mLs Intravenous Contrast Given 02/03/24 2201)                                    Medical Decision Making Amount and/or Complexity of Data Reviewed Labs: ordered. Radiology: ordered.  Risk Prescription drug management.    This patient presents to the ED for concern of upper abdominal discomfort differential diagnosis includes pancreatitis, cardiac disease, aneurysm, perforated ulcer, gastritis, peptic ulcer disease    Additional history obtained   Additional history obtained from Electronic Medical Record External records from outside source obtained and reviewed including medical record The patient had an MRI of the abdomen with and without contrast in March  of this year, it showed that the patient had a short segment of thickening of the terminal ileum, no other inflammatory conditions, there was diverticulosis without diverticulitis   Lab Tests:  I Ordered, and personally interpreted labs.  The pertinent results include: Troponin is negative at 5, CBC without leukocytosis, metabolic panel with mild hyperglycemia, mild transaminitis at 124 and a lipase of 59.   Imaging Studies ordered:  I ordered imaging studies including CT scan of the abdomen and pelvis shows some asymmetric gastric wall  thickening I independently visualized and interpreted imaging which showed gastric wall thickening I agree with the radiologist interpretation   Medicines ordered and prescription drug management:  I ordered medication including hydromorphone  and IV fluids with improvement    I have reviewed the patients home medicines and have made adjustments as needed   Problem List / ED Course:  The patient was informed of her results, I think she needs a follow-up with GI specialist for an endoscopy to make sure this is not a gastric cancer, could just be an ulcer, nonsurgical abdomen, no other sources of symptoms, vital signs stable   Social Determinants of Health:  None  I have discussed with the patient at the bedside the results, and the meaning of these results.  They have had opportunity to ask questions,  expressed their understanding to the need for follow-up with primary care physician  Message to Dr. Eartha to let him know about the care of this patient and the results tonight.  The patient was updated on her results and is agreeable to the plan.        Final diagnoses:  Acute gastritis without hemorrhage, unspecified gastritis type  Epigastric abdominal pain  Transaminitis    ED Discharge Orders          Ordered    pantoprazole  (PROTONIX ) 40 MG tablet  Daily        02/03/24 2246               Cleotilde Rogue, MD 02/03/24  2247

## 2024-02-03 NOTE — ED Notes (Signed)
 Pt/family received d/c paperwork at this time. After going over the paperwork any questions, comments, or concerns were answered to the best of this nurse's knowledge. The pt/family verbally acknowledged the teachings/instructions.

## 2024-02-03 NOTE — Discharge Instructions (Signed)
 Please call the office of Dr. Eartha tomorrow morning, you should be seen within the next 2 weeks for an evaluation of the cause of your stomach inflammation.  This may require further testing including a possible upper endoscopy.  Dr. Eartha can make that decision when he sees you.  Until then please stop taking Nexium  and start taking pantoprazole  once per day  I have prescribed a medication called Protonix  which is a medication that is used to treat acid reflux.  These type of medications will help to reduce the amount of acid in your stomach and if taken regularly they will help reduce your risk of stomach ulcers and your symptoms are related to acid reflux.  It is important that you take this medication at the same time every day.  It is also important to know that you can take this safely with medicine such as Pepcid .  It is similar to medications called omeprazole which is over-the-counter so if you choose to use that instead if it is a lower cost that is okay.  I want you to follow-up with your family doctor for further evaluation if this medication is not helping but return to the emergency department for severe or worsening symptoms  Thank you for allowing us  to treat you in the emergency department today.  After reviewing your examination and potential testing that was done it appears that you are safe to go home.  I would like for you to follow-up with your doctor within the next several days, have them obtain your records and follow-up with them to review all potential tests and results from your visit.  If you should develop severe or worsening symptoms return to the emergency department immediately

## 2024-02-04 ENCOUNTER — Inpatient Hospital Stay (HOSPITAL_COMMUNITY)

## 2024-02-04 ENCOUNTER — Inpatient Hospital Stay (HOSPITAL_COMMUNITY)
Admission: EM | Admit: 2024-02-04 | Discharge: 2024-02-07 | DRG: 394 | Disposition: A | Discharge status: Home / Self Care | Attending: Internal Medicine | Admitting: Internal Medicine

## 2024-02-04 ENCOUNTER — Telehealth (INDEPENDENT_AMBULATORY_CARE_PROVIDER_SITE_OTHER): Payer: Self-pay

## 2024-02-04 ENCOUNTER — Encounter (HOSPITAL_COMMUNITY): Payer: Self-pay

## 2024-02-04 ENCOUNTER — Other Ambulatory Visit: Payer: Self-pay

## 2024-02-04 ENCOUNTER — Emergency Department (HOSPITAL_COMMUNITY)

## 2024-02-04 DIAGNOSIS — R7401 Elevation of levels of liver transaminase levels: Secondary | ICD-10-CM

## 2024-02-04 DIAGNOSIS — K219 Gastro-esophageal reflux disease without esophagitis: Secondary | ICD-10-CM | POA: Diagnosis present

## 2024-02-04 DIAGNOSIS — E876 Hypokalemia: Secondary | ICD-10-CM | POA: Diagnosis not present

## 2024-02-04 DIAGNOSIS — Z79899 Other long term (current) drug therapy: Secondary | ICD-10-CM | POA: Diagnosis not present

## 2024-02-04 DIAGNOSIS — Z90710 Acquired absence of both cervix and uterus: Secondary | ICD-10-CM | POA: Diagnosis not present

## 2024-02-04 DIAGNOSIS — K2970 Gastritis, unspecified, without bleeding: Secondary | ICD-10-CM

## 2024-02-04 DIAGNOSIS — K9186 Retained cholelithiasis following cholecystectomy: Secondary | ICD-10-CM | POA: Diagnosis not present

## 2024-02-04 DIAGNOSIS — Z881 Allergy status to other antibiotic agents status: Secondary | ICD-10-CM | POA: Diagnosis not present

## 2024-02-04 DIAGNOSIS — Z888 Allergy status to other drugs, medicaments and biological substances status: Secondary | ICD-10-CM | POA: Diagnosis not present

## 2024-02-04 DIAGNOSIS — R1011 Right upper quadrant pain: Secondary | ICD-10-CM | POA: Diagnosis not present

## 2024-02-04 DIAGNOSIS — F32A Depression, unspecified: Secondary | ICD-10-CM | POA: Diagnosis not present

## 2024-02-04 DIAGNOSIS — Z8719 Personal history of other diseases of the digestive system: Secondary | ICD-10-CM

## 2024-02-04 DIAGNOSIS — K5730 Diverticulosis of large intestine without perforation or abscess without bleeding: Secondary | ICD-10-CM | POA: Diagnosis not present

## 2024-02-04 DIAGNOSIS — N281 Cyst of kidney, acquired: Secondary | ICD-10-CM | POA: Diagnosis not present

## 2024-02-04 DIAGNOSIS — R1013 Epigastric pain: Secondary | ICD-10-CM | POA: Diagnosis not present

## 2024-02-04 DIAGNOSIS — C642 Malignant neoplasm of left kidney, except renal pelvis: Secondary | ICD-10-CM | POA: Diagnosis present

## 2024-02-04 DIAGNOSIS — K5080 Crohn's disease of both small and large intestine without complications: Secondary | ICD-10-CM | POA: Diagnosis not present

## 2024-02-04 DIAGNOSIS — K8050 Calculus of bile duct without cholangitis or cholecystitis without obstruction: Secondary | ICD-10-CM | POA: Diagnosis not present

## 2024-02-04 DIAGNOSIS — R109 Unspecified abdominal pain: Secondary | ICD-10-CM | POA: Diagnosis not present

## 2024-02-04 DIAGNOSIS — Z906 Acquired absence of other parts of urinary tract: Secondary | ICD-10-CM

## 2024-02-04 DIAGNOSIS — K5010 Crohn's disease of large intestine without complications: Secondary | ICD-10-CM | POA: Diagnosis present

## 2024-02-04 DIAGNOSIS — Z887 Allergy status to serum and vaccine status: Secondary | ICD-10-CM

## 2024-02-04 DIAGNOSIS — Z820 Family history of epilepsy and other diseases of the nervous system: Secondary | ICD-10-CM

## 2024-02-04 DIAGNOSIS — F419 Anxiety disorder, unspecified: Secondary | ICD-10-CM | POA: Diagnosis present

## 2024-02-04 DIAGNOSIS — I10 Essential (primary) hypertension: Secondary | ICD-10-CM | POA: Diagnosis not present

## 2024-02-04 DIAGNOSIS — R932 Abnormal findings on diagnostic imaging of liver and biliary tract: Secondary | ICD-10-CM | POA: Diagnosis not present

## 2024-02-04 DIAGNOSIS — R933 Abnormal findings on diagnostic imaging of other parts of digestive tract: Secondary | ICD-10-CM | POA: Diagnosis not present

## 2024-02-04 DIAGNOSIS — Z9049 Acquired absence of other specified parts of digestive tract: Secondary | ICD-10-CM

## 2024-02-04 DIAGNOSIS — K838 Other specified diseases of biliary tract: Secondary | ICD-10-CM | POA: Diagnosis not present

## 2024-02-04 DIAGNOSIS — Z841 Family history of disorders of kidney and ureter: Secondary | ICD-10-CM

## 2024-02-04 DIAGNOSIS — E7800 Pure hypercholesterolemia, unspecified: Secondary | ICD-10-CM | POA: Diagnosis present

## 2024-02-04 DIAGNOSIS — Z803 Family history of malignant neoplasm of breast: Secondary | ICD-10-CM

## 2024-02-04 DIAGNOSIS — R1010 Upper abdominal pain, unspecified: Secondary | ICD-10-CM | POA: Insufficient documentation

## 2024-02-04 DIAGNOSIS — N2889 Other specified disorders of kidney and ureter: Secondary | ICD-10-CM | POA: Diagnosis not present

## 2024-02-04 DIAGNOSIS — I2510 Atherosclerotic heart disease of native coronary artery without angina pectoris: Secondary | ICD-10-CM | POA: Diagnosis present

## 2024-02-04 DIAGNOSIS — K3189 Other diseases of stomach and duodenum: Secondary | ICD-10-CM | POA: Diagnosis not present

## 2024-02-04 DIAGNOSIS — Z8249 Family history of ischemic heart disease and other diseases of the circulatory system: Secondary | ICD-10-CM | POA: Diagnosis not present

## 2024-02-04 DIAGNOSIS — R079 Chest pain, unspecified: Secondary | ICD-10-CM | POA: Diagnosis not present

## 2024-02-04 DIAGNOSIS — K2900 Acute gastritis without bleeding: Secondary | ICD-10-CM

## 2024-02-04 DIAGNOSIS — R7989 Other specified abnormal findings of blood chemistry: Principal | ICD-10-CM

## 2024-02-04 DIAGNOSIS — D72829 Elevated white blood cell count, unspecified: Secondary | ICD-10-CM | POA: Diagnosis not present

## 2024-02-04 LAB — CBC
HCT: 41.4 % (ref 36.0–46.0)
Hemoglobin: 13.9 g/dL (ref 12.0–15.0)
MCH: 29 pg (ref 26.0–34.0)
MCHC: 33.6 g/dL (ref 30.0–36.0)
MCV: 86.4 fL (ref 80.0–100.0)
Platelets: 172 K/uL (ref 150–400)
RBC: 4.79 MIL/uL (ref 3.87–5.11)
RDW: 13.3 % (ref 11.5–15.5)
WBC: 10.9 K/uL — ABNORMAL HIGH (ref 4.0–10.5)
nRBC: 0 % (ref 0.0–0.2)

## 2024-02-04 LAB — LIPASE, BLOOD: Lipase: 29 U/L (ref 11–51)

## 2024-02-04 LAB — URINALYSIS, ROUTINE W REFLEX MICROSCOPIC
Glucose, UA: NEGATIVE mg/dL
Ketones, ur: 5 mg/dL — AB
Nitrite: NEGATIVE
Protein, ur: 30 mg/dL — AB
Specific Gravity, Urine: 1.015 (ref 1.005–1.030)
pH: 6 (ref 5.0–8.0)

## 2024-02-04 LAB — APTT: aPTT: 33 s (ref 24–36)

## 2024-02-04 LAB — COMPREHENSIVE METABOLIC PANEL WITH GFR
ALT: 616 U/L — ABNORMAL HIGH (ref 0–44)
AST: 366 U/L — ABNORMAL HIGH (ref 15–41)
Albumin: 3.7 g/dL (ref 3.5–5.0)
Alkaline Phosphatase: 147 U/L — ABNORMAL HIGH (ref 38–126)
Anion gap: 14 (ref 5–15)
BUN: 11 mg/dL (ref 8–23)
CO2: 21 mmol/L — ABNORMAL LOW (ref 22–32)
Calcium: 9.3 mg/dL (ref 8.9–10.3)
Chloride: 103 mmol/L (ref 98–111)
Creatinine, Ser: 0.83 mg/dL (ref 0.44–1.00)
GFR, Estimated: >60 mL/min (ref >60)
Glucose, Bld: 112 mg/dL — ABNORMAL HIGH (ref 70–99)
Potassium: 3.2 mmol/L — ABNORMAL LOW (ref 3.5–5.1)
Sodium: 138 mmol/L (ref 135–145)
Total Bilirubin: 6.2 mg/dL — ABNORMAL HIGH (ref 0.0–1.2)
Total Protein: 7 g/dL (ref 6.5–8.1)

## 2024-02-04 LAB — PROTIME-INR
INR: 1.4 — ABNORMAL HIGH (ref 0.8–1.2)
Prothrombin Time: 17.7 s — ABNORMAL HIGH (ref 11.4–15.2)

## 2024-02-04 LAB — MAGNESIUM: Magnesium: 1.8 mg/dL (ref 1.7–2.4)

## 2024-02-04 LAB — I-STAT CG4 LACTIC ACID, ED: Lactic Acid, Venous: 1.7 mmol/L (ref 0.5–1.9)

## 2024-02-04 LAB — C-REACTIVE PROTEIN: CRP: 14 mg/dL — ABNORMAL HIGH (ref <1.0)

## 2024-02-04 MED ORDER — SODIUM CHLORIDE 0.9 % IV SOLN: 250.0000 mL | INTRAVENOUS | Status: AC | PRN

## 2024-02-04 MED ORDER — ONDANSETRON HCL 4 MG PO TABS: 4.0000 mg | ORAL_TABLET | Freq: Four times a day (QID) | ORAL | Status: DC | PRN

## 2024-02-04 MED ORDER — PANTOPRAZOLE SODIUM 40 MG IV SOLR
40.0000 mg | INTRAVENOUS | Status: DC
  Administered 2024-02-04 – 2024-02-06 (×3): 40 mg via INTRAVENOUS
  Filled 2024-02-04 (×3): qty 10

## 2024-02-04 MED ORDER — POTASSIUM CHLORIDE 10 MEQ/100ML IV SOLN
10.0000 meq | Freq: Once | INTRAVENOUS | Status: AC
  Administered 2024-02-04: 10 meq via INTRAVENOUS
  Filled 2024-02-04: qty 100

## 2024-02-04 MED ORDER — MORPHINE SULFATE (PF) 2 MG/ML IV SOLN
2.0000 mg | INTRAVENOUS | Status: DC | PRN
  Administered 2024-02-05 – 2024-02-06 (×2): 2 mg via INTRAVENOUS
  Filled 2024-02-04 (×2): qty 1

## 2024-02-04 MED ORDER — POTASSIUM CHLORIDE 20 MEQ PO PACK
40.0000 meq | PACK | Freq: Once | ORAL | Status: AC
  Administered 2024-02-05: 40 meq via ORAL
  Filled 2024-02-04: qty 2

## 2024-02-04 MED ORDER — POTASSIUM CHLORIDE 10 MEQ/100ML IV SOLN
10.0000 meq | INTRAVENOUS | Status: AC
  Filled 2024-02-04: qty 100

## 2024-02-04 MED ORDER — SODIUM CHLORIDE 0.9% FLUSH
3.0000 mL | Freq: Two times a day (BID) | INTRAVENOUS | Status: DC
  Administered 2024-02-04 – 2024-02-07 (×4): 3 mL via INTRAVENOUS

## 2024-02-04 MED ORDER — SUCRALFATE 1 G PO TABS
1.0000 g | ORAL_TABLET | Freq: Three times a day (TID) | ORAL | Status: DC
  Administered 2024-02-04 – 2024-02-07 (×10): 1 g via ORAL
  Filled 2024-02-04 (×10): qty 1

## 2024-02-04 MED ORDER — SODIUM CHLORIDE 0.9% FLUSH: 3.0000 mL | INTRAVENOUS | Status: DC | PRN

## 2024-02-04 MED ORDER — VENLAFAXINE HCL ER 37.5 MG PO CP24
37.5000 mg | ORAL_CAPSULE | Freq: Every day | ORAL | Status: DC
  Administered 2024-02-05 – 2024-02-07 (×3): 37.5 mg via ORAL
  Filled 2024-02-04 (×3): qty 1

## 2024-02-04 MED ORDER — ONDANSETRON HCL 4 MG/2ML IJ SOLN: 4.0000 mg | Freq: Four times a day (QID) | INTRAMUSCULAR | Status: DC | PRN

## 2024-02-04 MED ORDER — GADOBUTROL 1 MMOL/ML IV SOLN
6.0000 mL | Freq: Once | INTRAVENOUS | Status: AC | PRN
  Administered 2024-02-04: 6 mL via INTRAVENOUS

## 2024-02-04 MED ORDER — LACTATED RINGERS IV SOLN: INTRAVENOUS | Status: DC

## 2024-02-04 MED ORDER — METOPROLOL SUCCINATE ER 25 MG PO TB24
25.0000 mg | ORAL_TABLET | Freq: Every day | ORAL | Status: DC
  Administered 2024-02-04 – 2024-02-05 (×2): 25 mg via ORAL
  Filled 2024-02-04 (×2): qty 1

## 2024-02-04 MED ORDER — POTASSIUM CHLORIDE 10 MEQ/100ML IV SOLN
10.0000 meq | INTRAVENOUS | Status: AC
  Administered 2024-02-04: 10 meq via INTRAVENOUS
  Filled 2024-02-04: qty 100

## 2024-02-04 MED ORDER — SODIUM CHLORIDE 0.9% FLUSH
3.0000 mL | Freq: Two times a day (BID) | INTRAVENOUS | Status: DC
  Administered 2024-02-05 – 2024-02-07 (×4): 3 mL via INTRAVENOUS

## 2024-02-04 MED ORDER — ALUM & MAG HYDROXIDE-SIMETH 200-200-20 MG/5ML PO SUSP: 30.0000 mL | Freq: Four times a day (QID) | ORAL | Status: DC | PRN

## 2024-02-04 NOTE — Telephone Encounter (Addendum)
 Patient called back today stating she is having severe abdominal pain and she is now shaking, and is cold.She is unsure if she is running a fever. I advised that she please go back to the Ed. Patient states understanding. I spoke with Dr. Eartha, whom says the scan looks like patient may have a stone in her bile duct and her liver functions were elevated. She was advised to go to [facility] Ed so they can evaluate the patient to determine if she does in fact have a stone in her bile duct. If so [facility] does have the means of removing. Patient states understanding.

## 2024-02-04 NOTE — Telephone Encounter (Signed)
 Noted, Thanks

## 2024-02-04 NOTE — ED Notes (Addendum)
 Phlebotomy to stick

## 2024-02-04 NOTE — ED Triage Notes (Signed)
 Patient c/o chest pain with epigastric pain radiating to lower back since yesterday. Patient's GI recommended she come here for eval of bile duct stone, patient has gallbladder removed previously. Patient reports N/V after eating.

## 2024-02-04 NOTE — ED Notes (Signed)
 Patient transported to MRI

## 2024-02-04 NOTE — Telephone Encounter (Signed)
 Patient say she went to the Ed on 02/03/2024 and Ed doctor recommended she have a EGD.

## 2024-02-04 NOTE — Telephone Encounter (Signed)
 Thanks. Hopefully, a slot will open so she can see us  in clinic earlier. I look at the images and there is presence of some biliary ductal dilation which was not reported on the conclusions.  Please ask her to have a repeat CMP a day before her appointment. Thanks

## 2024-02-04 NOTE — Telephone Encounter (Signed)
 I spoke with the patient and made her aware the first available appointment is out until 02/25/2024 at 8:15 am with [facility]. Per Dr. Eartha place the patient on a cancellation list. Thanks,

## 2024-02-04 NOTE — Telephone Encounter (Signed)
 Thanks for the update, the patient had severely dilated common bile duct up to 19 mm per radiologist reading, although I was not able to identify a clear-cut lesion leading to the dilation.  She will benefit from emergent evaluation in the ER with MRCP as well.

## 2024-02-04 NOTE — H&P (Signed)
 History and Physical    Annette Briggs FMW:[id number] DOB: 1940-10-15 DOA: 02/04/2024  PCP: Annette Angelia Sieving, MD   Patient coming from: Home   Chief Complaint:  Chief Complaint  Patient presents with   Abdominal Pain   ED TRIAGE note:  Patient c/o chest pain with epigastric pain radiating to lower back since yesterday. Patient's GI recommended she come here for eval of bile duct stone, patient has gallbladder removed previously. Patient reports N/V after eating.            HPI:  [name] is a 83 y.o. female with medical history significant of Crohn's disease managing with Stelara , history of cholecystectomy, GERD, anxiety, depression, essential hypertension, IBS-diarrhea predominant and squamous cell carcinoma presented to emergency department referred from [facility] gastroenterology clinic for evaluation for MRCP.   Patient was presented to [facility] yesterday with complaining of midepigastric abdominal pain had imaging of CT abdomen pelvis which showed gastric wall thickening related to poor gastric distention sequela associated with gastritis and common bile duct dilation 19 mm. Today patient is returning back to emergency department at [facility] with complaining of worsening abdominal pain associated with nausea, vomiting and diarrhea.  Patient denies any fever and chills. Reported that feeling palpitation at home that attributed to gastritis.  Patient was discharged home yesterday with oral pantoprazole  that has not been picked up yet from pharmacy.   ED Course:  At presentation to ED patient found borderline hypertensive and tachycardic. CMP showed low potassium 3.2, low bicarb 21, elevated AST/ALT/ALP and bilirubin.  Normal lipase level 29. CBC showing leukocytosis 10.9 otherwise unremarkable.  Normal lactic acid level.  Blood cultures is pending.  In the ED ultrasound abdomen has been ordered pending result. ED physician also spoke with on-call  [facility]  Dr. Enid recommended to obtain MRCP and right upper quadrant ultrasound.  Hospitalist has been consulted for further evaluation management of transaminitis, hyperbilirubinemia, gastritis, abdominal pain and hypokalemia.    Significant labs in the ED: Lab Orders         Blood culture (routine x 2)         Gastrointestinal Panel by PCR , Stool         Gastrointestinal Panel by PCR , Stool         C Difficile Quick Screen w PCR reflex         Calprotectin, Fecal         Lipase, blood         Comprehensive metabolic panel         CBC         Urinalysis, Routine w reflex microscopic -Urine, Clean Catch         Comprehensive metabolic panel         CBC         C-reactive protein         Sedimentation rate         Hepatitis panel, acute         Magnesium          Protime-INR         APTT       Review of Systems:  Review of Systems  Constitutional:  Negative for chills, fever, malaise/fatigue and weight loss.  Respiratory:  Negative for sputum production.   Cardiovascular:  Negative for chest pain and palpitations.  Gastrointestinal:  Positive for abdominal pain, diarrhea, nausea and vomiting. Negative for blood in stool, constipation, heartburn and melena.  Genitourinary:  Negative for dysuria.  Musculoskeletal:  Negative for joint pain and myalgias.  Neurological:  Negative for dizziness and headaches.  Psychiatric/Behavioral:  The patient is not nervous/anxious.     Past Medical History:  Diagnosis Date   Anxiety    Crohn's colitis (HCC)    Depression    Essential hypertension    GERD (gastroesophageal reflux disease)     Past Surgical History:  Procedure Laterality Date   APPENDECTOMY     BLADDER SUSPENSION     CHOLECYSTECTOMY     COLONOSCOPY     2008   COLONOSCOPY N/A 02/13/2013   Procedure: COLONOSCOPY;  Surgeon: Claudis RAYMOND Rivet, MD;  Location[facility];  Service: Endoscopy;  Laterality: N/A;  730   COLONOSCOPY N/A 01/15/2019   Procedure:  COLONOSCOPY;  Surgeon: Rivet Claudis RAYMOND, MD;  Location: [facility];  Service: Endoscopy;  Laterality: N/A;  100   ESOPHAGOGASTRODUODENOSCOPY N/A 03/18/2020   Procedure: ESOPHAGOGASTRODUODENOSCOPY (EGD);  Surgeon: Rivet Claudis RAYMOND, MD;  Location: [facility];  Service: Endoscopy;  Laterality: N/A;  225, moved up per office   LEFT HEART CATH AND CORONARY ANGIOGRAPHY N/A 02/02/2020   Procedure: LEFT HEART CATH AND CORONARY ANGIOGRAPHY;  Surgeon: Wonda Sharper, MD;  Location: [facility] INVASIVE CV LAB;  Service: Cardiovascular;  Laterality: N/A;   TOTAL ABDOMINAL HYSTERECTOMY       reports that she has never smoked. She has never been exposed to tobacco smoke. She has never used smokeless tobacco. She reports that she does not drink alcohol and does not use drugs.  Allergies  Allergen Reactions   Ciprofloxacin Other (See Comments)    Patient can't remember reaction.   Pneumovax [Pneumococcal Polysaccharide Vaccine] Swelling   Wellbutrin [Bupropion] Anxiety    Anxiety attack    Family History  Problem Relation Age of Onset   Heart failure Mother    Kidney failure Father    Breast cancer Sister    Alzheimer's disease Sister     Prior to Admission medications   Medication Sig Start Date End Date Taking? Authorizing Provider  acetaminophen  (TYLENOL ) 500 MG tablet Take 500-1,000 mg by mouth every 6 (six) hours as needed for moderate pain or headache.     [provider]  atorvastatin (LIPITOR) 10 MG tablet Take 10 mg by mouth at bedtime.    [provider]  Cholecalciferol (DIALYVITE VITAMIN D  5000) 125 MCG (5000 UT) capsule Take 5,000 Units by mouth daily.    [provider]  famotidine  (PEPCID ) 20 MG tablet Take 1 tablet (20 mg total) by mouth at bedtime. 03/18/20   Rivet Claudis RAYMOND, MD  Loperamide HCl (IMODIUM PO) Take by mouth. One half daily    [provider]  metoprolol  succinate (TOPROL -XL) 25 MG 24 hr tablet Take 25 mg by mouth at bedtime.     [provider]  Misc Natural Products (OSTEO BI-FLEX ADV JOINT SHIELD PO) Take by mouth daily. Take 1/2 tablet    [provider]  Multiple Vitamin (MULTIVITAMIN) tablet Take 1 tablet by mouth daily.    [provider]  Multiple Vitamins-Minerals (AIRBORNE) CHEW Chew 1 tablet by mouth daily.    [provider]  OVER THE COUNTER MEDICATION Take 1 tablet by mouth daily. cognitex elite otc supplement    [provider]  OVER THE COUNTER MEDICATION OTC [employer] brand allergy relief one daily    [provider]  OVER THE COUNTER MEDICATION Zycam twice daily    [provider]  pantoprazole  (PROTONIX ) 40 MG tablet Take 1 tablet (40 mg total) by mouth daily. 02/03/24 04/03/24  Cleotilde Rogue, MD  psyllium (METAMUCIL SMOOTH TEXTURE) 58.6 % powder Take 1 packet by mouth at bedtime. 06/28/21   Golda Claudis PENNER, MD  Ustekinumab -auub (WEZLANA ) 90 MG/ML SOSY Inject 90 mg into the skin every 6 (six) weeks. 12/31/23   Castaneda Mayorga, Daniel, MD  venlafaxine  XR (EFFEXOR -XR) 37.5 MG 24 hr capsule Take 37.5 mg by mouth daily.    [provider]     Physical Exam: Vitals:   02/04/24 1534 02/04/24 1553 02/04/24 1844  BP: (!) 156/70    Pulse: (!) 103    Resp: 16    Temp: 98.8 F (37.1 C)  98 F (36.7 C)  TempSrc:   Oral  SpO2: 94%    Weight:  61.2 kg   Height:  5' 1 (1.549 m)     Physical Exam Vitals and nursing note reviewed.  HENT:     Head: Normocephalic.  Cardiovascular:     Rate and Rhythm: Normal rate and regular rhythm.     Heart sounds: Normal heart sounds.  Abdominal:     General: Abdomen is flat. Bowel sounds are normal. There is no distension.     Palpations: Abdomen is soft. There is no hepatomegaly or splenomegaly.     Tenderness: There is abdominal tenderness in the right upper quadrant and epigastric area. There is no guarding or rebound.     Hernia: There is no hernia in the umbilical area or ventral area.   Skin:    General: Skin is warm and dry.     Capillary Refill: Capillary refill takes less than 2 seconds.  Neurological:     Mental Status: She is alert and oriented to person, place, and time.  Psychiatric:        Mood and Affect: Mood normal.      Labs on Admission: I have personally reviewed following labs and imaging studies  CBC: Recent Labs  Lab 02/03/24 1943 02/04/24 1601  WBC 6.0 10.9*  HGB 12.8 13.9  HCT 38.7 41.4  MCV 90.2 86.4  PLT 192 172   Basic Metabolic Panel: Recent Labs  Lab 02/03/24 1943 02/04/24 1601  NA 141 138  K 3.4* 3.2*  CL 104 103  CO2 25 21*  GLUCOSE 138* 112*  BUN 13 11  CREATININE 0.68 0.83  CALCIUM 8.7* 9.3   GFR: Estimated Creatinine Clearance: 41.6 mL/min (by C-G formula based on SCr of 0.83 mg/dL). Liver Function Tests: Recent Labs  Lab 02/03/24 1943 02/04/24 1601  AST 124* 366*  ALT 99* 616*  ALKPHOS 99 147*  BILITOT 0.6 6.2*  PROT 6.6 7.0  ALBUMIN 3.8 3.7   Recent Labs  Lab 02/03/24 1943 02/04/24 1601  LIPASE 59* 29   No results for input(s): AMMONIA in the last 168 hours. Coagulation Profile: No results for input(s): INR, PROTIME in the last 168 hours. Cardiac Enzymes: Recent Labs  Lab 02/03/24 1943 02/03/24 2156  TROPONINIHS 4 5   BNP (last 3 results) No results for input(s): BNP in the last 8760 hours. HbA1C: No results for input(s): HGBA1C in the last 72 hours. CBG: No results for input(s): GLUCAP in the last 168 hours. Lipid Profile: No results for input(s): CHOL, HDL, LDLCALC, TRIG, CHOLHDL, LDLDIRECT in the last 72 hours. Thyroid Function Tests: No results for input(s): TSH, T4TOTAL, FREET4, T3FREE, THYROIDAB in the last 72 hours. Anemia Panel: No results for input(s):  VITAMINB12, FOLATE, FERRITIN, TIBC, IRON, RETICCTPCT in the last 72 hours. Urine analysis:    Component Value Date/Time   COLORURINE STRAW (A) 10/22/2020 1547   APPEARANCEUR CLEAR  10/22/2020 1547   LABSPEC 1.010 10/22/2020 1547   PHURINE 5.0 10/22/2020 1547   GLUCOSEU NEGATIVE 10/22/2020 1547   HGBUR NEGATIVE 10/22/2020 1547   BILIRUBINUR NEGATIVE 10/22/2020 1547   KETONESUR NEGATIVE 10/22/2020 1547   PROTEINUR NEGATIVE 10/22/2020 1547   NITRITE NEGATIVE 10/22/2020 1547   LEUKOCYTESUR MODERATE (A) 10/22/2020 1547    Radiological Exams on Admission: I have personally reviewed images US  Abdomen Limited RUQ (LIVER/GB) Result Date: 02/04/2024 CLINICAL DATA:  Elevated bilirubin. EXAM: ULTRASOUND ABDOMEN LIMITED RIGHT UPPER QUADRANT COMPARISON:  CT abdomen pelvis 02/03/2024. FINDINGS: Gallbladder: Surgically absent. Common bile duct: Diameter: 9 mm, with mild intrahepatic biliary ductal dilatation, expected status post cholecystectomy. Liver: No focal lesion identified. Within normal limits in parenchymal echogenicity. Portal vein is patent on color Doppler imaging with normal direction of blood flow towards the liver. Other: None. IMPRESSION: No acute findings. Electronically Signed   By: Newell Eke M.D.   On: 02/04/2024 19:36   CT ABDOMEN PELVIS W CONTRAST Result Date: 02/03/2024 CLINICAL DATA:  Pain across her stomach. EXAM: CT ABDOMEN AND PELVIS WITH CONTRAST TECHNIQUE: Multidetector CT imaging of the abdomen and pelvis was performed using the standard protocol following bolus administration of intravenous contrast. RADIATION DOSE REDUCTION: This exam was performed according to the departmental dose-optimization program which includes automated exposure control, adjustment of the mA and/or kV according to patient size and/or use of iterative reconstruction technique. CONTRAST:  100mL OMNIPAQUE  IOHEXOL  300 MG/ML  SOLN COMPARISON:  None Available. FINDINGS: Lower chest: No acute abnormality. Hepatobiliary: No focal liver abnormality is seen. Status post cholecystectomy. The common bile duct measures 19 mm. Pancreas: Unremarkable. No pancreatic ductal dilatation or surrounding  inflammatory changes. Spleen: Normal in size without focal abnormality. Adrenals/Urinary Tract: Adrenal glands are unremarkable. Kidneys are normal, without renal calculi, focal lesion, or hydronephrosis. Bladder is unremarkable. Stomach/Bowel: Mild-to-moderate severity asymmetric mural wall thickening is seen along the anterior and inferior aspects of the gastric body (best seen on coronal reformatted images 15 through 23, CT series 3). The appendix is surgically absent. No evidence of bowel wall thickening, distention, or inflammatory changes. Noninflamed diverticula are seen throughout the descending and sigmoid colon. Vascular/Lymphatic: Aortic atherosclerosis. No enlarged abdominal or pelvic lymph nodes. Reproductive: Status post hysterectomy. No adnexal masses. Other: No abdominal wall hernia or abnormality. No abdominopelvic ascites. Musculoskeletal: No acute or significant osseous findings. IMPRESSION: 1. Asymmetric gastric wall thickening involving the body of the stomach, as described above. While this may be, in part, related to poor gastric distension, sequelae associated with gastritis cannot be excluded. Gastric consult and subsequent nonemergent abdomen pelvis CT follow-up is recommended to determine stability and exclude the presence of an underlying neoplastic process. 2. Colonic diverticulosis. 3. Evidence of prior cholecystectomy, appendectomy and hysterectomy. 4. Aortic atherosclerosis. Electronically Signed   By: Suzen Dials M.D.   On: 02/03/2024 22:32   DG Chest 2 View Result Date: 02/03/2024 CLINICAL DATA:  Chest pain EXAM: DG CHEST 2V COMPARISON:  07/10/2022 FINDINGS: The heart size and mediastinal contours are within normal limits. Both lungs are clear. The visualized skeletal structures are unremarkable. IMPRESSION: No active cardiopulmonary disease. Electronically Signed   By: Luke Bun M.D.   On: 02/03/2024 20:11     EKG: My personal interpretation of EKG shows: Sinus  tachycardia heart rate 106 nonspecific ST-T  wave abnormality.  Normal QTc interval.    Assessment/Plan: Principal Problem:   Transaminitis Active Problems:   Hyperbilirubinemia   Gastritis   Essential hypertension   GERD (gastroesophageal reflux disease)   Midepigastric pain   History of IBS   History of cholecystectomy   Anxiety and depression    Assessment and Plan: Transaminitis Hyperbilirubinemia History of  Crohn's disease History of cholecystectomy -Patient has history of Crohn's disease on Stelara  presented to emergency department midepigastric abdominal pain, nausea, vomiting and diarrhea for last 2 days.  Patient has history of IBS and diarrhea at the baseline.  However nausea and vomiting noon.  Denies any fever and chills. History of cholecystectomy.  Patient recently missed dose of Stelara  infusion for insurance issues/financial restriction. - Patient follows [facility] gastroenterologist.  They have been recommended to present to the emergency department for evaluation for MRCP. - Patient initially went to [facility] yesterday 7/3 where CT abdomen pelvis showed gastritis, poor gastric distention, and common bile duct dilation 19 mm. - Today at presentation to ED patient is hemodynamically stable.  CMP showing significantly elevated AST 336, ALT 616, alkaline phosphatase 147 elevated bilirubin 6.2.  Normal lipase level.  CMP showing a leukocytosis 10.9 otherwise unremarkable.  Normal lactic acid level. - ED physician consulted spoke with [facility] Dr. Enid recommended to obtain hepatic ultrasound and MRCP. -Concern for significant transaminitis likely secondary from biliary duct dilation. Also checking hepatitis panel, ESR, CRP, GI panel, C. difficile panel, fecal Cal protein.  Checking pro time INR. - Will keep patient n.p.o. until MRCP will be done and after that we will resume clear liquid diet. - Continue maintenance fluid LR 100 cc/h. -Continue pain control  with morphine  as needed. -Will avoid further hepatotoxic agents.  Continue monitor aminocaproic function panel.   -Appreciate gastroenterology input. Addendum - MRCP showing intra and extrahepatic bile duct dilation with multiple tiny layering stones in the distal common bile duct. Waiting for gastroenterology evaluation regarding bile duct stone. - CT abdomen pelvis also showing upper pole left kidney supracapsular lesion concern for renal cell carcinoma. Please reach out to urology in the daytime for evaluation.  Gastritis - Patient has history of GERD.  CT abdomen pelvis showing evidence of gastritis and mild gastric distention.  Patient denies any hematemesis, melena/dark tarry stool. - Continue IV Protonix  40 mg daily and sucralfate  3 times daily.   Essential hypertension - Continue Toprol -XL 25 mg daily.   History of IBS-diarrhea predominance At home patient takes Imodium as needed for management of diarrhea.  Holding Imodium until C. difficile ruled out.  Anxiety and depression -Continue Effexor   Hypokalemia - Low potassium 3.3.  Replating with IV KCl.   DVT prophylaxis:  SCDs will avoid further pharmacological DVT prophylaxis in case patient will undergo any GI intervention tomorrow. Code Status:  Full Code Diet: N.p.o. until MRCP will be done.  After that we will start clear liquid diet. Family Communication:   Family was present at bedside, at the time of interview. Opportunity was given to ask question and all questions were answered satisfactorily.  Disposition Plan: Continue monitor improvement of hepatic function panel, hepatic ultrasound and MRCP result. Consults: Gasenterology. Admission status:   Inpatient, Telemetry bed  Severity of Illness: The appropriate patient status for this patient is INPATIENT. Inpatient status is judged to be reasonable and necessary in order to provide the required intensity of service to ensure the patient's safety. The patient's  presenting symptoms, physical exam findings, and initial radiographic and  laboratory data in the context of their chronic comorbidities is felt to place them at high risk for further clinical deterioration. Furthermore, it is not anticipated that the patient will be medically stable for discharge from the hospital within 2 midnights of admission.   * I certify that at the point of admission it is my clinical judgment that the patient will require inpatient hospital care spanning beyond 2 midnights from the point of admission due to high intensity of service, high risk for further deterioration and high frequency of surveillance required.DEWAINE    Kamren Heintzelman, MD [facility]  How to contact the [facility] Attending or Consulting provider 7A - 7P or covering provider during after hours 7P -7A, for this patient.  Check the care team in [facility] and look for a) attending/consulting [facility] provider listed and b) the [facility] team listed Log into [email or web address] and use [place]'s universal password to access. If you do not have the password, please contact the hospital operator. Locate the [facility] provider you are looking for under [facility] and page to a number that you can be directly reached. If you still have difficulty reaching the provider, please page the [facility] (Director on Call) for the Hospitalists listed on amion for assistance.  02/04/2024, 8:22 PM

## 2024-02-04 NOTE — ED Provider Notes (Signed)
 [place] EMERGENCY DEPARTMENT AT [facility] Provider Note   CSN: [id number] Arrival date & time: 02/04/24  1529     Patient presents with: Abdominal Pain   Annette Briggs is a 83 y.o. female.   83 year old female presenting with abdominal pain.  Patient was seen at [facility] yesterday for the same complaint, labs and imaging were largely unremarkable.  She returns today because of his severe worsening abdominal pain earlier this morning with associated nausea/vomiting/diarrhea.  No fevers at home.  She noticed that her heart rate was running fast at home.  Her symptoms were thought to be attributed to gastritis, was discharged with pantoprazole  yesterday but has not picked this up from the pharmacy.  She has a history of cholecystectomy many years ago.  At this point her pain has largely improved, she is not currently vomiting or having diarrhea.  History of Crohn's disease, on Stelara .   Abdominal Pain      Prior to Admission medications   Medication Sig Start Date End Date Taking? Authorizing Provider  acetaminophen  (TYLENOL ) 500 MG tablet Take 500-1,000 mg by mouth every 6 (six) hours as needed for moderate pain or headache.     [provider]  atorvastatin (LIPITOR) 10 MG tablet Take 10 mg by mouth at bedtime.    [provider]  Cholecalciferol (DIALYVITE VITAMIN D  5000) 125 MCG (5000 UT) capsule Take 5,000 Units by mouth daily.    [provider]  famotidine  (PEPCID ) 20 MG tablet Take 1 tablet (20 mg total) by mouth at bedtime. 03/18/20   Golda Claudis PENNER, MD  Loperamide HCl (IMODIUM PO) Take by mouth. One half daily    [provider]  metoprolol  succinate (TOPROL -XL) 25 MG 24 hr tablet Take 25 mg by mouth at bedtime.    [provider]  Misc Natural Products (OSTEO BI-FLEX ADV JOINT SHIELD PO) Take by mouth daily. Take 1/2 tablet    [provider]  Multiple Vitamin (MULTIVITAMIN) tablet Take 1 tablet by mouth  daily.    [provider]  Multiple Vitamins-Minerals (AIRBORNE) CHEW Chew 1 tablet by mouth daily.    [provider]  OVER THE COUNTER MEDICATION Take 1 tablet by mouth daily. cognitex elite otc supplement    [provider]  OVER THE COUNTER MEDICATION OTC [employer] brand allergy relief one daily    [provider]  OVER THE COUNTER MEDICATION Zycam twice daily    [provider]  pantoprazole  (PROTONIX ) 40 MG tablet Take 1 tablet (40 mg total) by mouth daily. 02/03/24 04/03/24  Cleotilde Rogue, MD  psyllium (METAMUCIL SMOOTH TEXTURE) 58.6 % powder Take 1 packet by mouth at bedtime. 06/28/21   Rehman, Claudis PENNER, MD  Ustekinumab -auub (WEZLANA ) 90 MG/ML SOSY Inject 90 mg into the skin every 6 (six) weeks. 12/31/23   Castaneda Mayorga, Daniel, MD  venlafaxine  XR (EFFEXOR -XR) 37.5 MG 24 hr capsule Take 37.5 mg by mouth daily.    [provider]    Allergies: Ciprofloxacin, Pneumovax [pneumococcal polysaccharide vaccine], and Wellbutrin [bupropion]    Review of Systems  Gastrointestinal:  Positive for abdominal pain.    Updated Vital Signs  Vitals:   02/04/24 1534 02/04/24 1553 02/04/24 1844  BP: (!) 156/70    Pulse: (!) 103    Resp: 16    Temp: 98.8 F (37.1 C)  98 F (36.7 C)  TempSrc:   Oral  SpO2: 94%    Weight:  61.2 kg   Height:  5' 1 (1.549 m)      Physical Exam Vitals and nursing note reviewed.  HENT:     Head: Normocephalic.  Eyes:     Extraocular Movements: Extraocular movements intact.  Cardiovascular:     Rate and Rhythm: Tachycardia present.  Pulmonary:     Effort: Pulmonary effort is normal.  Abdominal:     Palpations: Abdomen is soft.     Tenderness: There is no abdominal tenderness. There is no guarding.  Musculoskeletal:     Cervical back: Normal range of motion.     Comments: Moves all extremities spontaneously without difficulty  Skin:    General: Skin is warm and dry.  Neurological:     Mental Status:  She is alert and oriented to person, place, and time.     (all labs ordered are listed, but only abnormal results are displayed) Labs Reviewed  COMPREHENSIVE METABOLIC PANEL WITH GFR - Abnormal; Notable for the following components:      Result Value   Potassium 3.2 (*)    CO2 21 (*)    Glucose, Bld 112 (*)    AST 366 (*)    ALT 616 (*)    Alkaline Phosphatase 147 (*)    Total Bilirubin 6.2 (*)    All other components within normal limits  CBC - Abnormal; Notable for the following components:   WBC 10.9 (*)    All other components within normal limits  CULTURE, BLOOD (ROUTINE X 2)  CULTURE, BLOOD (ROUTINE X 2)  GASTROINTESTINAL PANEL BY PCR, STOOL (REPLACES STOOL CULTURE)  GASTROINTESTINAL PANEL BY PCR, STOOL (REPLACES STOOL CULTURE)  C DIFFICILE QUICK SCREEN W PCR REFLEX    CALPROTECTIN, FECAL  LIPASE, BLOOD  URINALYSIS, ROUTINE W REFLEX MICROSCOPIC  COMPREHENSIVE METABOLIC PANEL WITH GFR  CBC  C-REACTIVE PROTEIN  SEDIMENTATION RATE  HEPATITIS PANEL, ACUTE  MAGNESIUM   PROTIME-INR  APTT  I-STAT CG4 LACTIC ACID, ED    EKG: None  Radiology: US  Abdomen Limited RUQ (LIVER/GB) Result Date: 02/04/2024 CLINICAL DATA:  Elevated bilirubin. EXAM: ULTRASOUND ABDOMEN LIMITED RIGHT UPPER QUADRANT COMPARISON:  CT abdomen pelvis 02/03/2024. FINDINGS: Gallbladder: Surgically absent. Common bile duct: Diameter: 9 mm, with mild intrahepatic biliary ductal dilatation, expected status post cholecystectomy. Liver: No focal lesion identified. Within normal limits in parenchymal echogenicity. Portal vein is patent on color Doppler imaging with normal direction of blood flow towards the liver. Other: None. IMPRESSION: No acute findings. Electronically Signed   By: Newell Eke M.D.   On: 02/04/2024 19:36   CT ABDOMEN PELVIS W CONTRAST Result Date: 02/03/2024 CLINICAL DATA:  Pain across her stomach. EXAM: CT ABDOMEN AND PELVIS WITH CONTRAST TECHNIQUE: Multidetector CT imaging of the abdomen and  pelvis was performed using the standard protocol following bolus administration of intravenous contrast. RADIATION DOSE REDUCTION: This exam was performed according to the departmental dose-optimization program which includes automated exposure control, adjustment of the mA and/or kV according to patient size and/or use of iterative reconstruction technique. CONTRAST:  OMNIPAQUE  IOHEXOL  300 MG/ML  SOLN COMPARISON:  None Available. FINDINGS: Lower chest: No acute abnormality. Hepatobiliary: No focal liver abnormality is seen. Status post cholecystectomy. The common bile duct measures 19 mm. Pancreas: Unremarkable. No pancreatic ductal dilatation or surrounding inflammatory changes. Spleen: Normal in size without focal abnormality. Adrenals/Urinary Tract: Adrenal glands are unremarkable. Kidneys are normal, without renal calculi, focal lesion, or hydronephrosis. Bladder is unremarkable. Stomach/Bowel: Mild-to-moderate severity asymmetric mural wall thickening is seen along the anterior and inferior aspects of the gastric  body (best seen on coronal reformatted images 15 through 23, CT series 3). The appendix is surgically absent. No evidence of bowel wall thickening, distention, or inflammatory changes. Noninflamed diverticula are seen throughout the descending and sigmoid colon. Vascular/Lymphatic: Aortic atherosclerosis. No enlarged abdominal or pelvic lymph nodes. Reproductive: Status post hysterectomy. No adnexal masses. Other: No abdominal wall hernia or abnormality. No abdominopelvic ascites. Musculoskeletal: No acute or significant osseous findings. IMPRESSION: 1. Asymmetric gastric wall thickening involving the body of the stomach, as described above. While this may be, in part, related to poor gastric distension, sequelae associated with gastritis cannot be excluded. Gastric consult and subsequent nonemergent abdomen pelvis CT follow-up is recommended to determine stability and exclude the presence of an  underlying neoplastic process. 2. Colonic diverticulosis. 3. Evidence of prior cholecystectomy, appendectomy and hysterectomy. 4. Aortic atherosclerosis. Electronically Signed   By: Suzen Dials M.D.   On: 02/03/2024 22:32   DG Chest 2 View Result Date: 02/03/2024 CLINICAL DATA:  Chest pain EXAM: DG CHEST 2V COMPARISON:  07/10/2022 FINDINGS: The heart size and mediastinal contours are within normal limits. Both lungs are clear. The visualized skeletal structures are unremarkable. IMPRESSION: No active cardiopulmonary disease. Electronically Signed   By: Luke Bun M.D.   On: 02/03/2024 20:11     Procedures   Medications Ordered in the ED - No data to display                                  Medical Decision Making This patient presents to the ED for concern of abdominal pain/nausea/vomiting/diarrhea, this involves an extensive number of treatment options, and is a complaint that carries with it a high risk of complications and morbidity.  The differential diagnosis includes choledocholithiasis, cholangitis, pancreatitis, gastritis, diverticulitis   Co morbidities that complicate the patient evaluation  Crohn's disease, history of cystectomy/appendectomy/abdominal hysterectomy   Additional history obtained:  Additional history obtained from record review External records from outside source obtained and reviewed including emergency department note from yesterday   Lab Tests:  I Ordered, and personally interpreted labs.  The pertinent results include: CBC notable for leukocytosis of 10.9, this is elevated as compared to labs collected yesterday.  CMP notable for hypokalemia with potassium of 3.2, AST/ALT/alk phos are significantly elevated as compared to labs collected yesterday with AST 336, ALT 616, alk phos 147, T bili 6.2.  Lipase within normal limits.  Lactic within normal limits, 1.7.   Imaging Studies ordered:  I ordered imaging studies including right upper quadrant  ultrasound I independently visualized and interpreted imaging which showed No acute findings. MRCP ordered and pending at time of admission I agree with the radiologist interpretation   Cardiac Monitoring: / EKG:  The patient was maintained on a cardiac monitor.  I personally viewed and interpreted the cardiac monitored which showed an underlying rhythm of: Sinus tachycardia   Consultations Obtained:  I requested consultation with the gastroenterology,  and discussed lab and imaging findings as well as pertinent plan - they recommend: Spoke with Dr. Avram who reviewed the patient's imaging results from yesterday as well as labs yesterday versus today, he agrees that this patient would likely benefit from admission with plan for gastroenterology to see her in the hospital tomorrow, recommends MRCP for further evaluation. I spoke with Dr. Sundil with the hospitalist service who is in agreement and feels the patient would benefit from admission.   Problem List /  ED Course / Critical interventions / Medication management  I ordered medication including IV potassium for hypokalemia I have reviewed the patients home medicines and have made adjustments as needed   Test / Admission - Considered:  Physical exam notable as above.  Patient's labs show significant abnormalities today as compared to yesterday's results, specifically with significant LFT elevations including ALT in the 600s, AST in the 300s, alk phos 147, total bilirubin of 6.2.  I am concerned that this patient is presenting with choledocholithiasis versus cholangitis, white count is elevated at 10.9, it was within normal limits yesterday.  She is afebrile and pain has largely subsided at this time, however she notes that she was in a great deal of pain with continued nausea/vomiting/diarrhea when she initially arrived to the emergency department today.  Will proceed with right upper quadrant ultrasound to evaluate further given largely  unremarkable CT scan yesterday.  I spoke with the gastroenterology service as above, they request MRCP be completed and they plan to consult on this patient following in the morning once she is admitted. I spoke with the hospitalist service who is in agreement with this plan, she is appropriate for admission.    Amount and/or Complexity of Data Reviewed Labs: ordered. Radiology: ordered.  Risk Prescription drug management. Decision regarding hospitalization.        Final diagnoses:  Elevated LFTs  Abdominal pain, unspecified abdominal location    ED Discharge Orders     None          Glendia Rocky LOISE DEVONNA 02/04/24 2001    Melvenia Motto, MD 02/04/24 2125

## 2024-02-05 ENCOUNTER — Telehealth (INDEPENDENT_AMBULATORY_CARE_PROVIDER_SITE_OTHER): Payer: Self-pay | Admitting: Gastroenterology

## 2024-02-05 DIAGNOSIS — K8050 Calculus of bile duct without cholangitis or cholecystitis without obstruction: Secondary | ICD-10-CM

## 2024-02-05 DIAGNOSIS — R7989 Other specified abnormal findings of blood chemistry: Secondary | ICD-10-CM

## 2024-02-05 DIAGNOSIS — D72829 Elevated white blood cell count, unspecified: Secondary | ICD-10-CM

## 2024-02-05 DIAGNOSIS — R933 Abnormal findings on diagnostic imaging of other parts of digestive tract: Secondary | ICD-10-CM

## 2024-02-05 DIAGNOSIS — R7401 Elevation of levels of liver transaminase levels: Secondary | ICD-10-CM | POA: Diagnosis not present

## 2024-02-05 DIAGNOSIS — N2889 Other specified disorders of kidney and ureter: Secondary | ICD-10-CM

## 2024-02-05 DIAGNOSIS — K5080 Crohn's disease of both small and large intestine without complications: Secondary | ICD-10-CM

## 2024-02-05 LAB — CBC
HCT: 35.8 % — ABNORMAL LOW (ref 36.0–46.0)
Hemoglobin: 12.1 g/dL (ref 12.0–15.0)
MCH: 29.7 pg (ref 26.0–34.0)
MCHC: 33.8 g/dL (ref 30.0–36.0)
MCV: 88 fL (ref 80.0–100.0)
Platelets: 152 K/uL (ref 150–400)
RBC: 4.07 MIL/uL (ref 3.87–5.11)
RDW: 13.7 % (ref 11.5–15.5)
WBC: 15.2 K/uL — ABNORMAL HIGH (ref 4.0–10.5)
nRBC: 0 % (ref 0.0–0.2)

## 2024-02-05 LAB — COMPREHENSIVE METABOLIC PANEL WITH GFR
ALT: 372 U/L — ABNORMAL HIGH (ref 0–44)
AST: 186 U/L — ABNORMAL HIGH (ref 15–41)
Albumin: 2.9 g/dL — ABNORMAL LOW (ref 3.5–5.0)
Alkaline Phosphatase: 112 U/L (ref 38–126)
Anion gap: 9 (ref 5–15)
BUN: 11 mg/dL (ref 8–23)
CO2: 23 mmol/L (ref 22–32)
Calcium: 8.8 mg/dL — ABNORMAL LOW (ref 8.9–10.3)
Chloride: 106 mmol/L (ref 98–111)
Creatinine, Ser: 0.83 mg/dL (ref 0.44–1.00)
GFR, Estimated: >60 mL/min (ref >60)
Glucose, Bld: 111 mg/dL — ABNORMAL HIGH (ref 70–99)
Potassium: 3.8 mmol/L (ref 3.5–5.1)
Sodium: 138 mmol/L (ref 135–145)
Total Bilirubin: 4.4 mg/dL — ABNORMAL HIGH (ref 0.0–1.2)
Total Protein: 5.9 g/dL — ABNORMAL LOW (ref 6.5–8.1)

## 2024-02-05 LAB — GASTROINTESTINAL PANEL BY PCR, STOOL (REPLACES STOOL CULTURE)

## 2024-02-05 LAB — C DIFFICILE QUICK SCREEN W PCR REFLEX
C Diff antigen: NEGATIVE
C Diff interpretation: NOT DETECTED
C Diff toxin: NEGATIVE

## 2024-02-05 LAB — HEPATITIS PANEL, ACUTE
HCV Ab: NONREACTIVE
Hep A IgM: NONREACTIVE
Hep B C IgM: NONREACTIVE
Hepatitis B Surface Ag: NONREACTIVE

## 2024-02-05 LAB — SEDIMENTATION RATE: Sed Rate: 26 mm/h — ABNORMAL HIGH (ref 0–22)

## 2024-02-05 MED ORDER — LORATADINE 10 MG PO TABS
10.0000 mg | ORAL_TABLET | Freq: Every day | ORAL | Status: DC
  Administered 2024-02-05 – 2024-02-07 (×3): 10 mg via ORAL
  Filled 2024-02-05 (×3): qty 1

## 2024-02-05 MED ORDER — DEXTROSE 5 % IV SOLN: INTRAVENOUS | Status: DC

## 2024-02-05 NOTE — TOC CM/SW Note (Signed)
 Transition of Care [facility]) - Inpatient Brief Assessment   Patient Details  Name: Annette Briggs MRN: [id number] Date of Birth: [birth year]  Transition of Care [facility]) CM/SW Contact:    Annette Briggs, Annette Muskrat, RN Phone Number: 02/05/2024, 1:07 PM   Clinical Narrative:  Patient presented to the ED with worsening Abdominal pains, Chest pain, N/V/D. Patient presented to the [facility] a day prior to admit with same complaint.  CT Abdomen/Pelvis showed Gastric Wall thickening related to poor Gastric Distention Sequela associated with Gastritis and Common Bile Duct Dilation of about 19 mm. Patient underwent MRCP which showed Intra and Extrahepatic Bile Duct Dilation with multiple tiny layering Stones in the Distal Common Bile Duct. GI following. Plan for EGD/ERCP for evaluation of Gastric findings. Patient on Carafate . 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 Urology consulted.   From home, lives at the basement of her sister's home. Has three supportive children, daughter [name] at bedside. Modified independent, has a walker and rails at home.  PCP is Annette Briggs, Toribio, MD and uses [facility] on [phone] [facility].  Patient not Medically ready for discharge.  CM will continue to follow as patient progresses with care towards discharge.         Transition of Care Asessment: Insurance and Status: Insurance coverage has been reviewed Patient has primary care physician: Yes Home environment has been reviewed: Yes Prior level of function:: Modified Independent Prior/Current Home Services: No current home services Social Drivers of Health Review: SDOH reviewed no interventions necessary Readmission risk has been reviewed: Yes Transition of care needs: no transition of care needs at this time

## 2024-02-05 NOTE — Telephone Encounter (Signed)
 Noted

## 2024-02-05 NOTE — Plan of Care (Signed)
 No BM overnight   Problem: Education: Goal: Knowledge of General Education information will improve Description: Including pain rating scale, medication(s)/side effects and non-pharmacologic comfort measures Outcome: Progressing   Problem: Health Behavior/Discharge Planning: Goal: Ability to manage health-related needs will improve Outcome: Progressing

## 2024-02-05 NOTE — Plan of Care (Signed)
-   MRCP showing intra and extrahepatic bile duct dilation with multiple tiny layering stones in the distal common bile duct. -Waiting for gastroenterology evaluation regarding bile duct stone. - CT abdomen pelvis also showing upper pole left kidney supracapsular lesion concern for renal cell carcinoma. Please reach out to urology in the daytime for evaluation.

## 2024-02-05 NOTE — Progress Notes (Signed)
 [facility] ROUNDING NOTE Annette Briggs FMW:[id number]  DOB: 01/14/1941  DOA: 02/04/2024  PCP: Eartha Angelia Sieving, MD  02/05/2024,11:31 AM  LOS: 1 day    Code Status: Full code     from: Home current Dispo: Unclear    83 year old white female very active-still does 9 dancing [facility] to the senior [facility] Ileocolonic Crohn's disease on previously Stelara -last colonoscopy 2020 diverticulosis purulent discharge?  Diverticulitis-CT enterography 2021 stratification terminal ileum active Crohn's?  Adhesion has history of previous cholecystectomy hysterectomy Documentation from Dr. Eartha 08/31/2023 = cannot afford Stelara  CAD prior cath 2021 nonobstructive disease-follow-up echo EF 60-65%-dry weight 138 pounds  Went to [facility] ED 8/3 Called GI doctor with shaking chills abdominal discomfort-underwent workup inclusive of labs transaminitis 124 lipase 59 CBC without leukocytosis CT abdomen pelvis = asymmetric gastric wall thickening body of stomach poor gastric distention prior diverticulosis and recommended nonemergent follow-up-CBD was measuring 19 Came back to ED with midepigastric pain again to [name] this time did not pick up her pantoprazole  apparently Sodium 138 potassium 3.2 BUN/creatinine 11/0.8 AST 366 ALT 616 bilirubin 6.2 alk phos 147 no lipase performed CRP 14 magnesium  1.8 lactic acid 1.7 Follow-up MRCP showed intra extrahepatic ductal dilatation and multiple layering stones GI consult MRI also showed possible enhancing lesion in the right upper kidney suspicious for RCC   Plan Probable choledocholithiasis Diet as per GI, ERCP endoscopy to be done tomorrow Start NS 50 cc/H this p.m.-morphine  2 mg every 4 as needed severe pain, continue Protonix  40 every 24 Carafate  1 g 3 times daily meals bedtime  Ileocolonic Crohn's disease previously on Stelara  Defer to GI-, currently holding meds looks like was on Wezlana  90 every 6 weekly--- May need outpatient scope under care of Dr. Eartha when  appropriate per him C. difficile is pending, GI pathogen panel also is pending For stratification fecal calprotectin also ordered  CAD nonobstructive with cath in 2021 Continue atorvastatin, Lipitor 10/metoprolol  XL 25  Depression continue Effexor  XL 37.5 daily  New mass right kidney Discussed with [name] New who will set up outpatient close follow-up for discussion of this mass and stratification as an outpatient-family is aware as is patient and they are willing to come to [facility] for treatment  Data Reviewed:   Sodium 138 potassium 3.8 BUN/creatinine 11/0 point LFTs show AST/ALT 186 372 Bilirubin is 4 CRP is 14 WBCs 15 sed rate is 26 INR is 1.4  DVT prophylaxis: SCD  Status is: Inpatient Remains inpatient appropriate because:   Needs scope expect 2 to 3 days     Subjective:  Awake coherent alert no distress pain is controlled no fever no chills no nausea no vomiting   Objective + exam Vitals:   02/04/24 2227 02/04/24 2245 02/05/24 0432 02/05/24 0813  BP: 106/60 (!) 113/59 131/69 132/72  Pulse: 91 89 70 80  Resp: 16 18 18    Temp: 98.6 F (37 C) 98.2 F (36.8 C) (!) 97.5 F (36.4 C) 98 F (36.7 C)  TempSrc: Oral Oral Oral Oral  SpO2: 100% 93% 98% 100%  Weight:      Height:       Filed Weights   02/04/24 1553  Weight: 61.2 kg     Examination: EOMI NCAT no focal deficit no icterus or pallor looks younger than stated age neck soft supple S1-S2 no murmur Abdomen soft no rebound no shifting dullness No lower extremity edema  Scheduled Meds:  metoprolol  succinate  25 mg Oral QHS   pantoprazole  (PROTONIX ) IV  40 mg Intravenous Q24H   sodium chloride  flush  3 mL Intravenous Q12H   sodium chloride  flush  3 mL Intravenous Q12H   sucralfate   1 g Oral TID WC & HS   venlafaxine  XR  37.5 mg Oral Daily   Continuous Infusions:  sodium chloride      lactated ringers  100 mL/hr at 02/05/24 9074    Time 35  Jai-Gurmukh Wilford Merryfield, MD  [facility]

## 2024-02-05 NOTE — H&P (View-Only) (Signed)
 Consultation Note   Referring Provider:  [facility] PCP: Annette Briggs, Toribio, MD Primary Gastroenterologist: Dr. Eartha / [facility]       Reason for Consultation: Bile duct stones DOA: 02/04/2024         Hospital Day: 2   ASSESSMENT    Elevated LFTs, abdominal pain, N/V, choledocholithiasis MRCP showing multiple tiny distal CBD stones /intra/extrahepatic biliary duct dilation.  She is status postcholecystectomy  Possible gastric wall thickening on CT scan.  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  No bowel wall thickening on CT scan /MRI this admission.  Patient feels overall her Crohn's symptoms are under control.  Stools generally formed with exception of diarrhea about once a week which she has had within the last couple of days.  Stool studies are pending, diarrhea has resolved  Leukocytosis, progressive.  WBC 15 K today. No recent steroids. No active IBD on imaging. Fecal calprotectin pending. C-diff negative. GI path panel pending. Maybe 2/2 to biliary obstruction.   Chronic GERD On Pepcid  and Protonix  at home  Renal lesion MRI showing an 11 mm enhancing subcapsular lesion upper pole left kidney. Imaging concerning for renal cell carcinoma.   See PMH for additional history  Principal Problem:   Transaminitis Active Problems:   Essential hypertension   GERD (gastroesophageal reflux disease)   Hyperbilirubinemia   Midepigastric pain   Gastritis   History of IBS   History of cholecystectomy   Anxiety and depression    PLAN:   --Patient will need ERCP, hopefully tomorrow if scheduling allows. The benefits and risks of ERCP with possible sphincterotomy not limited to cardiopulmonary complications of sedation, bleeding, infection, perforation,and pancreatitis were discussed with the patient who agrees to proceed.    -- Will add EGD to ERCP for evaluation of gastric findings on imaging --Empiric antibiotics. My hesitancy is that LFTs are improving and she is at risk for C-diff with antibiotics and IBD -- Awaiting stool studies --Continue home daily pantoprazole  -- AM LFTs -- Will need workup of renal lesion  HPI   83 y.o. year old female with a medical history including but not limited to ileocolonic Crohn's disease, GERD, hypertension.   Patient followed closely by [facility] for ileocolonic Crohn's disease.  She is on Stelara  every 6 weeks, next dose due around the 25th of this month.  A couple of days ago she began having nausea, vomiting and severe generalized upper abdominal pain radiating through to her back.  She went to the ED on 8 3.  She had a mildly elevated white count, mildly elevated lipase, AST elevated at 124 and ALT 99.  Her bilirubin and alkaline phosphatase were normal.  CT scan showed asymmetric gastric wall thickening involving the body of the stomach, possibly related to poor gastric distention.  CBD was 19 mm status post cholecystectomy.  Patient was discharged home from the ED with recommendations to see her GI for an upper endoscopy to evaluate abnormal stomach on scan.  Patient called [facility] and was getting an appointment but the pain worsened and became associated with chills.  She was also having diarrhea but says this is not  unusual for her to have diarrhea about once a week with Crohn's disease.  Other than that her bowel movements have been formed over the last several months .  She was sent to the ED for further    Workup remarkable for WBC 15.2, AST 366 /ALT 616 , normal total bilirubin and alkaline phosphatase.   RUQ ultrasound  Mild intra and extrahepatic biliary duct dilation postcholecystectomy.  CBD 9 mm (as opposed to 19 mm on CT scan).   MRI / MRCP IMPRESSION: 1. Intra and extrahepatic biliary duct dilatation with multiple tiny layering stones in the  distal common bile duct. 2. 11 mm enhancing subcapsular lesion upper pole left kidney. Imaging concerning for renal cell carcinoma. Urology consultation recommended.  Labs and Imaging:  Recent Labs    02/03/24 1943 02/04/24 1601 02/05/24 0533  PROT 6.6 7.0 5.9*  ALBUMIN 3.8 3.7 2.9*  AST 124* 366* 186*  ALT 99* 616* 372*  ALKPHOS 99 147* 112  BILITOT 0.6 6.2* 4.4*   Recent Labs    02/03/24 1943 02/04/24 1601 02/05/24 0533  WBC 6.0 10.9* 15.2*  HGB 12.8 13.9 12.1  HCT 38.7 41.4 35.8*  MCV 90.2 86.4 88.0  PLT 192 172 152   Recent Labs    02/03/24 1943 02/04/24 1601 02/05/24 0533  NA 141 138 138  K 3.4* 3.2* 3.8  CL 104 103 106  CO2 25 21* 23  GLUCOSE 138* 112* 111*  BUN 13 11 11   CREATININE 0.68 0.83 0.83  CALCIUM 8.7* 9.3 8.8*     MR ABDOMEN MRCP W WO CONTAST CLINICAL DATA:  Abdominal pain with nausea vomiting and diarrhea. Elevated LFTs. History of cholecystectomy. Dilated common bile duct on CT scan yesterday.  EXAM: MRI ABDOMEN WITHOUT AND WITH CONTRAST (INCLUDING MRCP)  TECHNIQUE: Multiplanar multisequence MR imaging of the abdomen was performed both before and after the administration of intravenous contrast. Heavily T2-weighted images of the biliary and pancreatic ducts were obtained, and three-dimensional MRCP images were rendered by post processing.  CONTRAST:  6mL GADAVIST  GADOBUTROL  1 MMOL/ML IV SOLN  COMPARISON:  CT scan from yesterday.  FINDINGS: Lower chest: Dependent atelectasis in both lung bases.  Hepatobiliary: No suspicious focal abnormality within the liver parenchyma. Above intrahepatic biliary duct prominence evident. Common duct measures 14 mm diameter. Common bile duct in the head of the pancreas is 11 mm diameter. Multiple layering tiny stones are seen in the long the dependent mucosal surface of the distal common bile duct (well demonstrated MRCP coronal image 64/9 and axial T2 haste images 19-22 of series  3).  Pancreas: No focal mass lesion. No dilatation of the main duct. No intraparenchymal cyst. No peripancreatic edema.  Spleen:  No splenomegaly. No suspicious focal mass lesion.  Adrenals/Urinary Tract: No adrenal nodule or mass. Tiny simple cysts identified upper pole right kidney. 11 mm subcapsular T1 isointense,, T2 hypointense upper pole lesion is identified in the left kidney (T2 haste image 17 of series 3). This demonstrates diffuse enhancement after IV contrast administration. Left kidney otherwise unremarkable.  Stomach/Bowel: Stomach is decompressed, accentuating wall thickness. Duodenum is normally positioned as is the ligament of Treitz. No small bowel or colonic dilatation within the visualized abdomen.  Vascular/Lymphatic: No abdominal aortic aneurysm. No abdominal lymphadenopathy.  Other:  No intraperitoneal free fluid.  Musculoskeletal: No focal suspicious marrow enhancement within the visualized bony anatomy.  IMPRESSION: 1. Intra and extrahepatic biliary duct dilatation with multiple tiny layering stones in the distal common bile duct. 2. 11  mm enhancing subcapsular lesion upper pole left kidney. Imaging concerning for renal cell carcinoma. Urology consultation recommended.  Electronically Signed   By: Camellia Candle M.D.   On: 02/05/2024 05:26    Pertinent GI Studies    MR Enterography Marcy 03-10-2024 IMPRESSION: 1. Similar appearance of short segment wall thickening of the terminal ileum, involving a segment no greater than 5 cm in length. On today's examination there is minimal if any mucosal hyperenhancement, and there is no evidence of overt stricture, obstruction, fistula, or abscess. Findings remain consistent with stigmata of Crohn's ileitis, without evidence of significant active inflammation or complication at this time. 2. No other inflammatory findings of the bowel. 3. Descending and sigmoid diverticulosis without evidence of  acute diverticulitis. 4. Status post hysterectomy.  Pelvic floor prolapse with cystocele. 5. Status post cholecystectomy.  Last Colonoscopy: 01/15/2019, purulent discharge from diverticulosis consistent with diverticulitis, otherwise there were no other inflammatory changes in the examined colon.    Past Medical History:  Diagnosis Date   Anxiety    Crohn's colitis (HCC)    Depression    Essential hypertension    GERD (gastroesophageal reflux disease)     Past Surgical History:  Procedure Laterality Date   APPENDECTOMY     BLADDER SUSPENSION     CHOLECYSTECTOMY     COLONOSCOPY     2007/03/11   COLONOSCOPY N/A 02/13/2013   Procedure: COLONOSCOPY;  Surgeon: Claudis RAYMOND Rivet, MD;  Location: [facility];  Service: Endoscopy;  Laterality: N/A;  730   COLONOSCOPY N/A 01/15/2019   Procedure: COLONOSCOPY;  Surgeon: Rivet Claudis RAYMOND, MD;  Location: [facility];  Service: Endoscopy;  Laterality: N/A;  100   ESOPHAGOGASTRODUODENOSCOPY N/A 03/18/2020   Procedure: ESOPHAGOGASTRODUODENOSCOPY (EGD);  Surgeon: Rivet Claudis RAYMOND, MD;  Location: [facility];  Service: Endoscopy;  Laterality: N/A;  225, moved up per office   LEFT HEART CATH AND CORONARY ANGIOGRAPHY N/A 02/02/2020   Procedure: LEFT HEART CATH AND CORONARY ANGIOGRAPHY;  Surgeon: Wonda Sharper, MD;  Location: [facility] INVASIVE CV LAB;  Service: Cardiovascular;  Laterality: N/A;   TOTAL ABDOMINAL HYSTERECTOMY      Family History  Problem Relation Age of Onset   Heart failure Mother    Kidney failure Father    Breast cancer Sister    Alzheimer's disease Sister     Prior to Admission medications   Medication Sig Start Date End Date Taking? Authorizing Provider  acetaminophen  (TYLENOL ) 500 MG tablet Take 500-1,000 mg by mouth every 6 (six) hours as needed for moderate pain or headache.     [provider]  atorvastatin (LIPITOR) 10 MG tablet Take 10 mg by mouth at bedtime.    [provider]  Cholecalciferol (DIALYVITE  VITAMIN D  5000) 125 MCG (5000 UT) capsule Take 5,000 Units by mouth daily.    [provider]  famotidine  (PEPCID ) 20 MG tablet Take 1 tablet (20 mg total) by mouth at bedtime. 03/18/20   Rivet Claudis RAYMOND, MD  Loperamide HCl (IMODIUM PO) Take by mouth. One half daily    [provider]  metoprolol  succinate (TOPROL -XL) 25 MG 24 hr tablet Take 25 mg by mouth at bedtime.    [provider]  Misc Natural Products (OSTEO BI-FLEX ADV JOINT SHIELD PO) Take by mouth daily. Take 1/2 tablet    [provider]  Multiple Vitamin (MULTIVITAMIN) tablet Take 1 tablet by mouth daily.    [provider]  Multiple Vitamins-Minerals (AIRBORNE) CHEW Chew 1 tablet  by mouth daily.    [provider]  OVER THE COUNTER MEDICATION Take 1 tablet by mouth daily. cognitex elite otc supplement    [provider]  OVER THE COUNTER MEDICATION OTC [employer] brand allergy relief one daily    [provider]  OVER THE COUNTER MEDICATION Zycam twice daily    [provider]  pantoprazole  (PROTONIX ) 40 MG tablet Take 1 tablet (40 mg total) by mouth daily. 02/03/24 04/03/24  Cleotilde Rogue, MD  psyllium (METAMUCIL SMOOTH TEXTURE) 58.6 % powder Take 1 packet by mouth at bedtime. 06/28/21   Rehman, Claudis PENNER, MD  Ustekinumab -auub (WEZLANA ) 90 MG/ML SOSY Inject 90 mg into the skin every 6 (six) weeks. 12/31/23   Castaneda Mayorga, Daniel, MD  venlafaxine  XR (EFFEXOR -XR) 37.5 MG 24 hr capsule Take 37.5 mg by mouth daily.    [provider]    Current Facility-Administered Medications  Medication Dose Route Frequency Provider Last Rate Last Admin   0.9 %  sodium chloride  infusion  250 mL Intravenous PRN Sundil, Subrina, MD       alum & mag hydroxide-simeth (MAALOX/MYLANTA) 200-200-20 MG/5ML suspension 30 mL  30 mL Oral Q6H PRN Sundil, Subrina, MD       lactated ringers  infusion   Intravenous Continuous Sundil, Subrina, MD 100 mL/hr at 02/04/24 2003 New Bag  at 02/04/24 2003   metoprolol  succinate (TOPROL -XL) 24 hr tablet 25 mg  25 mg Oral QHS Sundil, Subrina, MD   25 mg at 02/04/24 2304   morphine  (PF) 2 MG/ML injection 2 mg  2 mg Intravenous Q4H PRN Sundil, Subrina, MD       ondansetron  (ZOFRAN ) tablet 4 mg  4 mg Oral Q6H PRN Sundil, Subrina, MD       Or   ondansetron  (ZOFRAN ) injection 4 mg  4 mg Intravenous Q6H PRN Sundil, Subrina, MD       pantoprazole  (PROTONIX ) injection 40 mg  40 mg Intravenous Q24H Sundil, Subrina, MD   40 mg at 02/04/24 1958   sodium chloride  flush (NS) 0.9 % injection 3 mL  3 mL Intravenous Q12H Sundil, Subrina, MD       sodium chloride  flush (NS) 0.9 % injection 3 mL  3 mL Intravenous Q12H Sundil, Subrina, MD   3 mL at 02/04/24 2318   sodium chloride  flush (NS) 0.9 % injection 3 mL  3 mL Intravenous PRN Sundil, Subrina, MD       sucralfate  (CARAFATE ) tablet 1 g  1 g Oral TID WC & HS Sundil, Subrina, MD   1 g at 02/04/24 2304   venlafaxine  XR (EFFEXOR -XR) 24 hr capsule 37.5 mg  37.5 mg Oral Daily Sundil, Subrina, MD        Allergies as of 02/04/2024 - Review Complete 02/04/2024  Allergen Reaction Noted   Ciprofloxacin Other (See Comments) 02/05/2013   Pneumovax [pneumococcal polysaccharide vaccine] Swelling 02/07/2013   Wellbutrin [bupropion] Anxiety 02/05/2013    Social History   Socioeconomic History   Marital status: Widowed    Spouse name: Not on file   Number of children: Not on file   Years of education: Not on file   Highest education level: Not on file  Occupational History   Not on file  Tobacco Use   Smoking status: Never    Passive exposure: Never   Smokeless tobacco: Never  Vaping Use   Vaping status: Never Used  Substance and Sexual Activity   Alcohol use: No    Alcohol/week: 0.0 standard drinks of  alcohol   Drug use: No   Sexual activity: Not on file  Other Topics Concern   Not on file  Social History Narrative   Not on file   Social Drivers of Health   Financial Resource Strain:  Not on file  Food Insecurity: No Food Insecurity (02/05/2024)   Hunger Vital Sign    Worried About Running Out of Food in the Last Year: Never true    Ran Out of Food in the Last Year: Never true  Transportation Needs: No Transportation Needs (02/05/2024)   PRAPARE - Administrator, Civil Service (Medical): No    Lack of Transportation (Non-Medical): No  Physical Activity: Not on file  Stress: Not on file  Social Connections: Unknown (02/05/2024)   Social Connection and Isolation Panel    Frequency of Communication with Friends and Family: More than three times a week    Frequency of Social Gatherings with Friends and Family: More than three times a week    Attends Religious Services: More than 4 times per year    Active Member of [employer] or Organizations: Yes    Attends Banker Meetings: More than 4 times per year    Marital Status: Patient declined  Intimate Partner Violence: Not At Risk (02/05/2024)   Humiliation, Afraid, Rape, and Kick questionnaire    Fear of Current or Ex-Partner: No    Emotionally Abused: No    Physically Abused: No    Sexually Abused: No     Code Status   Code Status: Full Code  Review of Systems: All systems reviewed and negative except where noted in HPI.  Physical Exam: Vital signs in last 24 hours: Temp:  [97.5 F (36.4 C)-98.8 F (37.1 C)] 98 F (36.7 C) (08/05 0813) Pulse Rate:  [70-103] 80 (08/05 0813) Resp:  [16-18] 18 (08/05 0432) BP: (106-156)/(59-72) 132/72 (08/05 0813) SpO2:  [93 %-100 %] 100 % (08/05 0813) Weight:  [61.2 kg] 61.2 kg (08/04 1553)    General:  Pleasant female in NAD Psych:  Cooperative. Normal mood and affect Eyes: Pupils equal Ears:  Normal auditory acuity Nose: No deformity, discharge or lesions Neck:  Supple, no masses felt Lungs:  Clear to auscultation.  Heart:  Regular rate, regular rhythm.  Abdomen:  Soft, nondistended, nontender, active bowel sounds, no masses felt Rectal :   Deferred Msk: Symmetrical without gross deformities.  Neurologic:  Alert, oriented, grossly normal neurologically Extremities : No edema Skin:  Intact without significant lesions.    Intake/Output from previous day: 08/04 0701 - 08/05 0700 In: 87.9 [I.V.:56.9; IV Piggyback:31] Out: -  Intake/Output this shift:  No intake/output data recorded.   Vina Dasen, NP-C   02/05/2024, 8:41 AM

## 2024-02-05 NOTE — Telephone Encounter (Signed)
 I called patient to offer her appointment with [facility] for this afternoon, but she said she was in the hospital and thinks she may have to have surgery and wanted Dr Eartha to be aware. She is still on the schedule with us  for 8/25.

## 2024-02-05 NOTE — Consult Note (Addendum)
 Consultation Note   Referring Provider:  [facility] PCP: Annette Flavors, Toribio, Annette Primary Gastroenterologist: Dr. Eartha / [facility]       Reason for Consultation: Bile duct stones DOA: 02/04/2024         Hospital Day: 2   ASSESSMENT    Elevated LFTs, abdominal pain, N/V, choledocholithiasis MRCP showing multiple tiny distal CBD stones /intra/extrahepatic biliary duct dilation.  She is status postcholecystectomy  Possible gastric wall thickening on CT scan.  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  No bowel wall thickening on CT scan /MRI this admission.  Patient feels overall her Crohn's symptoms are under control.  Stools generally formed with exception of diarrhea about once a week which she has had within the last couple of days.  Stool studies are pending, diarrhea has resolved  Leukocytosis, progressive.  WBC 15 K today. No recent steroids. No active IBD on imaging. Fecal calprotectin pending. C-diff negative. GI path panel pending. Maybe 2/2 to biliary obstruction.   Chronic GERD On Pepcid  and Protonix  at home  Renal lesion MRI showing an 11 mm enhancing subcapsular lesion upper pole left kidney. Imaging concerning for renal cell carcinoma.   See PMH for additional history  Principal Problem:   Transaminitis Active Problems:   Essential hypertension   GERD (gastroesophageal reflux disease)   Hyperbilirubinemia   Midepigastric pain   Gastritis   History of IBS   History of cholecystectomy   Anxiety and depression    PLAN:   --Patient will need ERCP, hopefully tomorrow if scheduling allows. The benefits and risks of ERCP with possible sphincterotomy not limited to cardiopulmonary complications of sedation, bleeding, infection, perforation,and pancreatitis were discussed with the patient who agrees to proceed.    -- Will add EGD to ERCP for evaluation of gastric findings on imaging --Empiric antibiotics. My hesitancy is that LFTs are improving and she is at risk for C-diff with antibiotics and IBD -- Awaiting stool studies --Continue home daily pantoprazole  -- AM LFTs -- Will need workup of renal lesion  HPI   83 y.o. year old female with a medical history including but not limited to ileocolonic Crohn's disease, GERD, hypertension.   Patient followed closely by [facility] for ileocolonic Crohn's disease.  She is on Stelara  every 6 weeks, next dose due around the 25th of this month.  A couple of days ago she began having nausea, vomiting and severe generalized upper abdominal pain radiating through to her back.  She went to the ED on 8 3.  She had a mildly elevated white count, mildly elevated lipase, AST elevated at 124 and ALT 99.  Her bilirubin and alkaline phosphatase were normal.  CT scan showed asymmetric gastric wall thickening involving the body of the stomach, possibly related to poor gastric distention.  CBD was 19 mm status post cholecystectomy.  Patient was discharged home from the ED with recommendations to see her GI for an upper endoscopy to evaluate abnormal stomach on scan.  Patient called [facility] and was getting an appointment but the pain worsened and became associated with chills.  She was also having diarrhea but says this is not  unusual for her to have diarrhea about once a week with Crohn's disease.  Other than that her bowel movements have been formed over the last several months .  She was sent to the ED for further    Workup remarkable for WBC 15.2, AST 366 /ALT 616 , normal total bilirubin and alkaline phosphatase.   RUQ ultrasound  Mild intra and extrahepatic biliary duct dilation postcholecystectomy.  CBD 9 mm (as opposed to 19 mm on CT scan).   MRI / MRCP IMPRESSION: 1. Intra and extrahepatic biliary duct dilatation with multiple tiny layering stones in the  distal common bile duct. 2. 11 mm enhancing subcapsular lesion upper pole left kidney. Imaging concerning for renal cell carcinoma. Urology consultation recommended.  Labs and Imaging:  Recent Labs    02/03/24 1943 02/04/24 1601 02/05/24 0533  PROT 6.6 7.0 5.9*  ALBUMIN 3.8 3.7 2.9*  AST 124* 366* 186*  ALT 99* 616* 372*  ALKPHOS 99 147* 112  BILITOT 0.6 6.2* 4.4*   Recent Labs    02/03/24 1943 02/04/24 1601 02/05/24 0533  WBC 6.0 10.9* 15.2*  HGB 12.8 13.9 12.1  HCT 38.7 41.4 35.8*  MCV 90.2 86.4 88.0  PLT 192 172 152   Recent Labs    02/03/24 1943 02/04/24 1601 02/05/24 0533  NA 141 138 138  K 3.4* 3.2* 3.8  CL 104 103 106  CO2 25 21* 23  GLUCOSE 138* 112* 111*  BUN 13 11 11   CREATININE 0.68 0.83 0.83  CALCIUM 8.7* 9.3 8.8*     MR ABDOMEN MRCP W WO CONTAST CLINICAL DATA:  Abdominal pain with nausea vomiting and diarrhea. Elevated LFTs. History of cholecystectomy. Dilated common bile duct on CT scan yesterday.  EXAM: MRI ABDOMEN WITHOUT AND WITH CONTRAST (INCLUDING MRCP)  TECHNIQUE: Multiplanar multisequence MR imaging of the abdomen was performed both before and after the administration of intravenous contrast. Heavily T2-weighted images of the biliary and pancreatic ducts were obtained, and three-dimensional MRCP images were rendered by post processing.  CONTRAST:  6mL GADAVIST  GADOBUTROL  1 MMOL/ML IV SOLN  COMPARISON:  CT scan from yesterday.  FINDINGS: Lower chest: Dependent atelectasis in both lung bases.  Hepatobiliary: No suspicious focal abnormality within the liver parenchyma. Above intrahepatic biliary duct prominence evident. Common duct measures 14 mm diameter. Common bile duct in the head of the pancreas is 11 mm diameter. Multiple layering tiny stones are seen in the long the dependent mucosal surface of the distal common bile duct (well demonstrated MRCP coronal image 64/9 and axial T2 haste images 19-22 of series  3).  Pancreas: No focal mass lesion. No dilatation of the main duct. No intraparenchymal cyst. No peripancreatic edema.  Spleen:  No splenomegaly. No suspicious focal mass lesion.  Adrenals/Urinary Tract: No adrenal nodule or mass. Tiny simple cysts identified upper pole right kidney. 11 mm subcapsular T1 isointense,, T2 hypointense upper pole lesion is identified in the left kidney (T2 haste image 17 of series 3). This demonstrates diffuse enhancement after IV contrast administration. Left kidney otherwise unremarkable.  Stomach/Bowel: Stomach is decompressed, accentuating wall thickness. Duodenum is normally positioned as is the ligament of Treitz. No small bowel or colonic dilatation within the visualized abdomen.  Vascular/Lymphatic: No abdominal aortic aneurysm. No abdominal lymphadenopathy.  Other:  No intraperitoneal free fluid.  Musculoskeletal: No focal suspicious marrow enhancement within the visualized bony anatomy.  IMPRESSION: 1. Intra and extrahepatic biliary duct dilatation with multiple tiny layering stones in the distal common bile duct. 2. 11  mm enhancing subcapsular lesion upper pole left kidney. Imaging concerning for renal cell carcinoma. Urology consultation recommended.  Electronically Signed   By: Camellia Candle M.D.   On: 02/05/2024 05:26    Pertinent GI Studies    MR Enterography Marcy 03-10-2024 IMPRESSION: 1. Similar appearance of short segment wall thickening of the terminal ileum, involving a segment no greater than 5 cm in length. On today's examination there is minimal if any mucosal hyperenhancement, and there is no evidence of overt stricture, obstruction, fistula, or abscess. Findings remain consistent with stigmata of Crohn's ileitis, without evidence of significant active inflammation or complication at this time. 2. No other inflammatory findings of the bowel. 3. Descending and sigmoid diverticulosis without evidence of  acute diverticulitis. 4. Status post hysterectomy.  Pelvic floor prolapse with cystocele. 5. Status post cholecystectomy.  Last Colonoscopy: 01/15/2019, purulent discharge from diverticulosis consistent with diverticulitis, otherwise there were no other inflammatory changes in the examined colon.    Past Medical History:  Diagnosis Date   Anxiety    Crohn's colitis (HCC)    Depression    Essential hypertension    GERD (gastroesophageal reflux disease)     Past Surgical History:  Procedure Laterality Date   APPENDECTOMY     BLADDER SUSPENSION     CHOLECYSTECTOMY     COLONOSCOPY     2007/03/11   COLONOSCOPY N/A 02/13/2013   Procedure: COLONOSCOPY;  Surgeon: Annette RAYMOND Rivet, Annette;  Location: [facility];  Service: Endoscopy;  Laterality: N/A;  730   COLONOSCOPY N/A 01/15/2019   Procedure: COLONOSCOPY;  Surgeon: Rivet Annette RAYMOND, Annette;  Location: [facility];  Service: Endoscopy;  Laterality: N/A;  100   ESOPHAGOGASTRODUODENOSCOPY N/A 03/18/2020   Procedure: ESOPHAGOGASTRODUODENOSCOPY (EGD);  Surgeon: Rivet Annette RAYMOND, Annette;  Location: [facility];  Service: Endoscopy;  Laterality: N/A;  225, moved up per office   LEFT HEART CATH AND CORONARY ANGIOGRAPHY N/A 02/02/2020   Procedure: LEFT HEART CATH AND CORONARY ANGIOGRAPHY;  Surgeon: Wonda Sharper, Annette;  Location: [facility] INVASIVE CV LAB;  Service: Cardiovascular;  Laterality: N/A;   TOTAL ABDOMINAL HYSTERECTOMY      Family History  Problem Relation Age of Onset   Heart failure Mother    Kidney failure Father    Breast cancer Sister    Alzheimer's disease Sister     Prior to Admission medications   Medication Sig Start Date End Date Taking? Authorizing Provider  acetaminophen  (TYLENOL ) 500 MG tablet Take 500-1,000 mg by mouth every 6 (six) hours as needed for moderate pain or headache.     Provider, Historical, Annette  atorvastatin (LIPITOR) 10 MG tablet Take 10 mg by mouth at bedtime.    Provider, Historical, Annette  Cholecalciferol (DIALYVITE  VITAMIN D  5000) 125 MCG (5000 UT) capsule Take 5,000 Units by mouth daily.    Provider, Historical, Annette  famotidine  (PEPCID ) 20 MG tablet Take 1 tablet (20 mg total) by mouth at bedtime. 03/18/20   Rivet Annette RAYMOND, Annette  Loperamide HCl (IMODIUM PO) Take by mouth. One half daily    Provider, Historical, Annette  metoprolol  succinate (TOPROL -XL) 25 MG 24 hr tablet Take 25 mg by mouth at bedtime.    Provider, Historical, Annette  Misc Natural Products (OSTEO BI-FLEX ADV JOINT SHIELD PO) Take by mouth daily. Take 1/2 tablet    Provider, Historical, Annette  Multiple Vitamin (MULTIVITAMIN) tablet Take 1 tablet by mouth daily.    Provider, Historical, Annette  Multiple Vitamins-Minerals (AIRBORNE) CHEW Chew 1 tablet  by mouth daily.    Provider, Historical, Annette  OVER THE COUNTER MEDICATION Take 1 tablet by mouth daily. cognitex elite otc supplement    Provider, Historical, Annette  OVER THE COUNTER MEDICATION OTC [employer] brand allergy relief one daily    Provider, Historical, Annette  OVER THE COUNTER MEDICATION Zycam twice daily    Provider, Historical, Annette  pantoprazole  (PROTONIX ) 40 MG tablet Take 1 tablet (40 mg total) by mouth daily. 02/03/24 04/03/24  Cleotilde Rogue, Annette  psyllium (METAMUCIL SMOOTH TEXTURE) 58.6 % powder Take 1 packet by mouth at bedtime. 06/28/21   Briggs, Annette PENNER, Annette  Ustekinumab -auub (WEZLANA ) 90 MG/ML SOSY Inject 90 mg into the skin every 6 (six) weeks. 12/31/23   Castaneda Briggs, Daniel, Annette  venlafaxine  XR (EFFEXOR -XR) 37.5 MG 24 hr capsule Take 37.5 mg by mouth daily.    Provider, Historical, Annette    Current Facility-Administered Medications  Medication Dose Route Frequency Provider Last Rate Last Admin   0.9 %  sodium chloride  infusion  250 mL Intravenous PRN Briggs, Subrina, Annette       alum & mag hydroxide-simeth (MAALOX/MYLANTA) 200-200-20 MG/5ML suspension 30 mL  30 mL Oral Q6H PRN Briggs, Subrina, Annette       lactated ringers  infusion   Intravenous Continuous Briggs, Subrina, Annette 100 mL/hr at 02/04/24 2003 New Bag  at 02/04/24 2003   metoprolol  succinate (TOPROL -XL) 24 hr tablet 25 mg  25 mg Oral QHS Briggs, Subrina, Annette   25 mg at 02/04/24 2304   morphine  (PF) 2 MG/ML injection 2 mg  2 mg Intravenous Q4H PRN Briggs, Subrina, Annette       ondansetron  (ZOFRAN ) tablet 4 mg  4 mg Oral Q6H PRN Briggs, Subrina, Annette       Or   ondansetron  (ZOFRAN ) injection 4 mg  4 mg Intravenous Q6H PRN Briggs, Subrina, Annette       pantoprazole  (PROTONIX ) injection 40 mg  40 mg Intravenous Q24H Briggs, Subrina, Annette   40 mg at 02/04/24 1958   sodium chloride  flush (NS) 0.9 % injection 3 mL  3 mL Intravenous Q12H Briggs, Subrina, Annette       sodium chloride  flush (NS) 0.9 % injection 3 mL  3 mL Intravenous Q12H Briggs, Subrina, Annette   3 mL at 02/04/24 2318   sodium chloride  flush (NS) 0.9 % injection 3 mL  3 mL Intravenous PRN Briggs, Subrina, Annette       sucralfate  (CARAFATE ) tablet 1 g  1 g Oral TID WC & HS Briggs, Subrina, Annette   1 g at 02/04/24 2304   venlafaxine  XR (EFFEXOR -XR) 24 hr capsule 37.5 mg  37.5 mg Oral Daily Briggs, Subrina, Annette        Allergies as of 02/04/2024 - Review Complete 02/04/2024  Allergen Reaction Noted   Ciprofloxacin Other (See Comments) 02/05/2013   Pneumovax [pneumococcal polysaccharide vaccine] Swelling 02/07/2013   Wellbutrin [bupropion] Anxiety 02/05/2013    Social History   Socioeconomic History   Marital status: Widowed    Spouse name: Not on file   Number of children: Not on file   Years of education: Not on file   Highest education level: Not on file  Occupational History   Not on file  Tobacco Use   Smoking status: Never    Passive exposure: Never   Smokeless tobacco: Never  Vaping Use   Vaping status: Never Used  Substance and Sexual Activity   Alcohol use: No    Alcohol/week: 0.0 standard drinks of  alcohol   Drug use: No   Sexual activity: Not on file  Other Topics Concern   Not on file  Social History Narrative   Not on file   Social Drivers of Health   Financial Resource Strain:  Not on file  Food Insecurity: No Food Insecurity (02/05/2024)   Hunger Vital Sign    Worried About Running Out of Food in the Last Year: Never true    Ran Out of Food in the Last Year: Never true  Transportation Needs: No Transportation Needs (02/05/2024)   PRAPARE - Administrator, Civil Service (Medical): No    Lack of Transportation (Non-Medical): No  Physical Activity: Not on file  Stress: Not on file  Social Connections: Unknown (02/05/2024)   Social Connection and Isolation Panel    Frequency of Communication with Friends and Family: More than three times a week    Frequency of Social Gatherings with Friends and Family: More than three times a week    Attends Religious Services: More than 4 times per year    Active Member of [employer] or Organizations: Yes    Attends Banker Meetings: More than 4 times per year    Marital Status: Patient declined  Intimate Partner Violence: Not At Risk (02/05/2024)   Humiliation, Afraid, Rape, and Kick questionnaire    Fear of Current or Ex-Partner: No    Emotionally Abused: No    Physically Abused: No    Sexually Abused: No     Code Status   Code Status: Full Code  Review of Systems: All systems reviewed and negative except where noted in HPI.  Physical Exam: Vital signs in last 24 hours: Temp:  [97.5 F (36.4 C)-98.8 F (37.1 C)] 98 F (36.7 C) (08/05 0813) Pulse Rate:  [70-103] 80 (08/05 0813) Resp:  [16-18] 18 (08/05 0432) BP: (106-156)/(59-72) 132/72 (08/05 0813) SpO2:  [93 %-100 %] 100 % (08/05 0813) Weight:  [61.2 kg] 61.2 kg (08/04 1553)    General:  Pleasant female in NAD Psych:  Cooperative. Normal mood and affect Eyes: Pupils equal Ears:  Normal auditory acuity Nose: No deformity, discharge or lesions Neck:  Supple, no masses felt Lungs:  Clear to auscultation.  Heart:  Regular rate, regular rhythm.  Abdomen:  Soft, nondistended, nontender, active bowel sounds, no masses felt Rectal :   Deferred Msk: Symmetrical without gross deformities.  Neurologic:  Alert, oriented, grossly normal neurologically Extremities : No edema Skin:  Intact without significant lesions.    Intake/Output from previous day: 08/04 0701 - 08/05 0700 In: 87.9 [I.V.:56.9; IV Piggyback:31] Out: -  Intake/Output this shift:  No intake/output data recorded.   Vina Dasen, NP-C   02/05/2024, 8:41 AM

## 2024-02-05 NOTE — Telephone Encounter (Signed)
 Yes, Dr. Eartha is aware of this. Thanks

## 2024-02-05 NOTE — Hospital Course (Signed)
 I am going to talk to her left I think I have also learned people in like you can take or walking to my needs to have her taken out

## 2024-02-05 NOTE — Plan of Care (Signed)
   Problem: Education: Goal: Knowledge of General Education information will improve Description: Including pain rating scale, medication(s)/side effects and non-pharmacologic comfort measures Outcome: Progressing   Problem: Health Behavior/Discharge Planning: Goal: Ability to manage health-related needs will improve Outcome: Progressing   Problem: Clinical Measurements: Goal: Ability to maintain clinical measurements within normal limits will improve Outcome: Progressing Goal: Will remain free from infection Outcome: Progressing Goal: Diagnostic test results will improve Outcome: Progressing Goal: Respiratory complications will improve Outcome: Progressing Goal: Cardiovascular complication will be avoided Outcome: Progressing   Problem: Activity: Goal: Risk for activity intolerance will decrease Outcome: Progressing   Problem: Nutrition: Goal: Adequate nutrition will be maintained Outcome: Progressing   Problem: Pain Managment: Goal: General experience of comfort will improve and/or be controlled Outcome: Progressing   Problem: Safety: Goal: Ability to remain free from injury will improve Outcome: Progressing   Problem: Skin Integrity: Goal: Risk for impaired skin integrity will decrease Outcome: Progressing

## 2024-02-06 ENCOUNTER — Inpatient Hospital Stay (HOSPITAL_COMMUNITY): Payer: Self-pay

## 2024-02-06 ENCOUNTER — Inpatient Hospital Stay (HOSPITAL_COMMUNITY)

## 2024-02-06 ENCOUNTER — Encounter (HOSPITAL_COMMUNITY)
Admission: EM | Disposition: A | Discharge status: Home / Self Care | Payer: Self-pay | Source: Home / Self Care | Attending: Family Medicine

## 2024-02-06 ENCOUNTER — Encounter (HOSPITAL_COMMUNITY): Payer: Self-pay | Admitting: Internal Medicine

## 2024-02-06 DIAGNOSIS — K8050 Calculus of bile duct without cholangitis or cholecystitis without obstruction: Secondary | ICD-10-CM | POA: Diagnosis not present

## 2024-02-06 DIAGNOSIS — K838 Other specified diseases of biliary tract: Secondary | ICD-10-CM

## 2024-02-06 DIAGNOSIS — R7401 Elevation of levels of liver transaminase levels: Secondary | ICD-10-CM | POA: Diagnosis not present

## 2024-02-06 LAB — CBC WITH DIFFERENTIAL/PLATELET
Abs Immature Granulocytes: 0.05 K/uL (ref 0.00–0.07)
Basophils Absolute: 0 K/uL (ref 0.0–0.1)
Basophils Relative: 0 %
Eosinophils Absolute: 0.1 K/uL (ref 0.0–0.5)
Eosinophils Relative: 1 %
HCT: 36.8 % (ref 36.0–46.0)
Hemoglobin: 12.2 g/dL (ref 12.0–15.0)
Immature Granulocytes: 1 %
Lymphocytes Relative: 9 %
Lymphs Abs: 0.7 K/uL (ref 0.7–4.0)
MCH: 29.1 pg (ref 26.0–34.0)
MCHC: 33.2 g/dL (ref 30.0–36.0)
MCV: 87.8 fL (ref 80.0–100.0)
Monocytes Absolute: 0.5 K/uL (ref 0.1–1.0)
Monocytes Relative: 6 %
Neutro Abs: 6.7 K/uL (ref 1.7–7.7)
Neutrophils Relative %: 83 %
Platelets: 150 K/uL (ref 150–400)
RBC: 4.19 MIL/uL (ref 3.87–5.11)
RDW: 13.6 % (ref 11.5–15.5)
WBC: 8 K/uL (ref 4.0–10.5)
nRBC: 0 % (ref 0.0–0.2)

## 2024-02-06 LAB — COMPREHENSIVE METABOLIC PANEL WITH GFR
ALT: 220 U/L — ABNORMAL HIGH (ref 0–44)
AST: 75 U/L — ABNORMAL HIGH (ref 15–41)
Albumin: 2.9 g/dL — ABNORMAL LOW (ref 3.5–5.0)
Alkaline Phosphatase: 113 U/L (ref 38–126)
Anion gap: 9 (ref 5–15)
BUN: 13 mg/dL (ref 8–23)
CO2: 23 mmol/L (ref 22–32)
Calcium: 8.6 mg/dL — ABNORMAL LOW (ref 8.9–10.3)
Chloride: 104 mmol/L (ref 98–111)
Creatinine, Ser: 0.86 mg/dL (ref 0.44–1.00)
GFR, Estimated: >60 mL/min (ref >60)
Glucose, Bld: 114 mg/dL — ABNORMAL HIGH (ref 70–99)
Potassium: 3.3 mmol/L — ABNORMAL LOW (ref 3.5–5.1)
Sodium: 136 mmol/L (ref 135–145)
Total Bilirubin: 1.8 mg/dL — ABNORMAL HIGH (ref 0.0–1.2)
Total Protein: 5.9 g/dL — ABNORMAL LOW (ref 6.5–8.1)

## 2024-02-06 LAB — PROTIME-INR
INR: 1.1 (ref 0.8–1.2)
Prothrombin Time: 15 s (ref 11.4–15.2)

## 2024-02-06 LAB — LIPASE, BLOOD: Lipase: 36 U/L (ref 11–51)

## 2024-02-06 SURGERY — ERCP, WITH INTERVENTION IF INDICATED
Anesthesia: General

## 2024-02-06 SURGERY — EGD (ESOPHAGOGASTRODUODENOSCOPY)
Anesthesia: Monitor Anesthesia Care

## 2024-02-06 MED ORDER — PHENYLEPHRINE HCL-NACL 20-0.9 MG/250ML-% IV SOLN
INTRAVENOUS | Status: DC | PRN
Start: 2024-02-06 — End: 2024-02-06
  Administered 2024-02-06: 20 ug/min via INTRAVENOUS

## 2024-02-06 MED ORDER — FENTANYL CITRATE (PF) 100 MCG/2ML IJ SOLN
INTRAMUSCULAR | Status: AC
  Filled 2024-02-06: qty 2

## 2024-02-06 MED ORDER — INDOMETHACIN 50 MG RE SUPP
RECTAL | Status: AC
  Filled 2024-02-06: qty 2

## 2024-02-06 MED ORDER — PHENYLEPHRINE 80 MCG/ML (10ML) SYRINGE FOR IV PUSH (FOR BLOOD PRESSURE SUPPORT)
PREFILLED_SYRINGE | INTRAVENOUS | Status: DC | PRN
Start: 2024-02-06 — End: 2024-02-06
  Administered 2024-02-06: 120 ug via INTRAVENOUS
  Administered 2024-02-06: 40 ug via INTRAVENOUS

## 2024-02-06 MED ORDER — LORAZEPAM 0.5 MG PO TABS
0.5000 mg | ORAL_TABLET | Freq: Once | ORAL | Status: AC
  Administered 2024-02-06: 0.5 mg via ORAL
  Filled 2024-02-06: qty 1

## 2024-02-06 MED ORDER — SODIUM CHLORIDE 0.9 % IV SOLN
1.5000 g | INTRAVENOUS | Status: AC
  Administered 2024-02-06: 1.5 g via INTRAVENOUS
  Filled 2024-02-06: qty 4

## 2024-02-06 MED ORDER — SODIUM CHLORIDE 0.9 % IV SOLN
INTRAVENOUS | Status: DC | PRN
  Administered 2024-02-06: 95 mL

## 2024-02-06 MED ORDER — METOPROLOL SUCCINATE ER 25 MG PO TB24
25.0000 mg | ORAL_TABLET | Freq: Every day | ORAL | Status: DC
  Administered 2024-02-06: 25 mg via ORAL
  Filled 2024-02-06: qty 1

## 2024-02-06 MED ORDER — ROCURONIUM BROMIDE 10 MG/ML (PF) SYRINGE
PREFILLED_SYRINGE | INTRAVENOUS | Status: DC | PRN
  Administered 2024-02-06: 40 mg via INTRAVENOUS
  Administered 2024-02-06: 10 mg via INTRAVENOUS

## 2024-02-06 MED ORDER — LIDOCAINE 2% (20 MG/ML) 5 ML SYRINGE
INTRAMUSCULAR | Status: DC | PRN
  Administered 2024-02-06: 40 mg via INTRAVENOUS

## 2024-02-06 MED ORDER — INDOMETHACIN 50 MG RE SUPP
RECTAL | Status: DC | PRN
  Administered 2024-02-06: 100 mg via RECTAL

## 2024-02-06 MED ORDER — LACTATED RINGERS IV SOLN
INTRAVENOUS | Status: AC | PRN
  Administered 2024-02-06: 1000 mL via INTRAVENOUS

## 2024-02-06 MED ORDER — PROPOFOL 10 MG/ML IV BOLUS
INTRAVENOUS | Status: DC | PRN
  Administered 2024-02-06: 80 mg via INTRAVENOUS

## 2024-02-06 MED ORDER — ONDANSETRON HCL 4 MG/2ML IJ SOLN
INTRAMUSCULAR | Status: DC | PRN
  Administered 2024-02-06: 4 mg via INTRAVENOUS

## 2024-02-06 MED ORDER — GLUCAGON HCL RDNA (DIAGNOSTIC) 1 MG IJ SOLR
INTRAMUSCULAR | Status: AC
  Filled 2024-02-06: qty 2

## 2024-02-06 MED ORDER — DEXAMETHASONE SODIUM PHOSPHATE 10 MG/ML IJ SOLN
INTRAMUSCULAR | Status: DC | PRN
  Administered 2024-02-06: 4 mg via INTRAVENOUS

## 2024-02-06 MED ORDER — DICLOFENAC SUPPOSITORY 100 MG
RECTAL | Status: AC
  Filled 2024-02-06: qty 1

## 2024-02-06 MED ORDER — SUGAMMADEX SODIUM 200 MG/2ML IV SOLN
INTRAVENOUS | Status: DC | PRN
  Administered 2024-02-06: 200 mg via INTRAVENOUS

## 2024-02-06 MED ORDER — INDOMETHACIN 50 MG RE SUPP: 100.0000 mg | Freq: Once | RECTAL | Status: DC

## 2024-02-06 MED ORDER — FENTANYL CITRATE (PF) 250 MCG/5ML IJ SOLN
INTRAMUSCULAR | Status: DC | PRN
  Administered 2024-02-06 (×2): 50 ug via INTRAVENOUS

## 2024-02-06 NOTE — Transfer of Care (Signed)
 Immediate Anesthesia Transfer of Care Note  Patient: Annette Briggs  Procedure(s) Performed: ERCP, WITH INTERVENTION IF INDICATED  Patient Location: Endo   Anesthesia Type:General  Level of Consciousness: drowsy  Airway & Oxygen Therapy: Patient Spontanous Breathing and Patient connected to face mask oxygen  Post-op Assessment: Report given to RN and Post -op Vital signs reviewed and stable  Post vital signs: Reviewed and stable  Last Vitals:  Vitals Value Taken Time  BP 168/75 02/06/24 16:34  Temp 36.3 C 02/06/24 16:34  Pulse 83 02/06/24 16:39  Resp 17 02/06/24 16:39  SpO2 99 % 02/06/24 16:39  Vitals shown include unfiled device data.  Last Pain:  Vitals:   02/06/24 1634  TempSrc: Temporal  PainSc: 0-No pain         Complications: No notable events documented.

## 2024-02-06 NOTE — Plan of Care (Signed)
  Problem: Education: Goal: Knowledge of General Education information will improve Description: Including pain rating scale, medication(s)/side effects and non-pharmacologic comfort measures Outcome: Progressing   Problem: Health Behavior/Discharge Planning: Goal: Ability to manage health-related needs will improve Outcome: Progressing   Problem: Clinical Measurements: Goal: Ability to maintain clinical measurements within normal limits will improve Outcome: Progressing Goal: Will remain free from infection Outcome: Progressing Goal: Diagnostic test results will improve Outcome: Progressing Goal: Respiratory complications will improve Outcome: Progressing Goal: Cardiovascular complication will be avoided Outcome: Progressing   Problem: Activity: Goal: Risk for activity intolerance will decrease Outcome: Progressing   Problem: Nutrition: Goal: Adequate nutrition will be maintained Outcome: Progressing   Problem: Pain Managment: Goal: General experience of comfort will improve and/or be controlled Outcome: Progressing   Problem: Elimination: Goal: Will not experience complications related to bowel motility Outcome: Progressing Goal: Will not experience complications related to urinary retention Outcome: Progressing   Problem: Skin Integrity: Goal: Risk for impaired skin integrity will decrease Outcome: Progressing

## 2024-02-06 NOTE — Anesthesia Preprocedure Evaluation (Addendum)
 Anesthesia Evaluation  Patient identified by MRN, date of birth, ID band Patient awake    Reviewed: Allergy & Precautions, NPO status , Patient's Chart, lab work & pertinent test results, reviewed documented beta blocker date and time   Airway Mallampati: III  TM Distance: >3 FB Neck ROM: Full    Dental  (+) Teeth Intact, Dental Advisory Given, Caps   Pulmonary neg pulmonary ROS   Pulmonary exam normal breath sounds clear to auscultation       Cardiovascular hypertension, Pt. on home beta blockers and Pt. on medications + angina  Normal cardiovascular exam Rhythm:Regular Rate:Normal  TTE 2024  1. Left ventricular ejection fraction, by estimation, is 60 to 65%. The  left ventricle has normal function. The left ventricle has no regional  wall motion abnormalities. Left ventricular diastolic parameters are  consistent with Grade I diastolic  dysfunction (impaired relaxation).   2. Right ventricular systolic function is normal. The right ventricular  size is normal. Tricuspid regurgitation signal is inadequate for assessing  PA pressure.   3. The mitral valve is normal in structure. No evidence of mitral valve  regurgitation. No evidence of mitral stenosis.   4. The aortic valve is tricuspid. Aortic valve regurgitation is not  visualized. No aortic stenosis is present.   5. The inferior vena cava is normal in size with greater than 50%  respiratory variability, suggesting right atrial pressure of 3 mmHg.   Cath 2021 1.  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  Normal LV function with normal LVEDP, LVEF estimated at 55%     Neuro/Psych  PSYCHIATRIC DISORDERS Anxiety Depression    negative neurological ROS     GI/Hepatic Neg liver ROS,GERD  ,,Crohn's   Endo/Other  negative endocrine ROS    Renal/GU negative Renal ROS  negative  genitourinary   Musculoskeletal negative musculoskeletal ROS (+)    Abdominal   Peds  Hematology negative hematology ROS (+)   Anesthesia Other Findings  83 y.o. female with medical history significant of Crohn's disease managing with Stelara , history of cholecystectomy, GERD, anxiety, depression, essential hypertension, IBS-diarrhea predominant presented with abdominal pain, nausea and vomiting  Reproductive/Obstetrics                              Anesthesia Physical Anesthesia Plan  ASA: 2  Anesthesia Plan: General   Post-op Pain Management: Minimal or no pain anticipated   Induction: Intravenous  PONV Risk Score and Plan: 3 and Dexamethasone , Ondansetron  and Treatment may vary due to age or medical condition  Airway Management Planned: Oral ETT  Additional Equipment:   Intra-op Plan:   Post-operative Plan: Extubation in OR  Informed Consent: I have reviewed the patients History and Physical, chart, labs and discussed the procedure including the risks, benefits and alternatives for the proposed anesthesia with the patient or authorized representative who has indicated his/her understanding and acceptance.     Dental advisory given  Plan Discussed with: CRNA  Anesthesia Plan Comments:          Anesthesia Quick Evaluation

## 2024-02-06 NOTE — Interval H&P Note (Signed)
 History and Physical Interval Note:  02/06/2024 1:50 PM  Annette Briggs  has presented today for surgery, with the diagnosis of Choledocholithiasis.  Case discussed with the primary inpatient GI team.  Laboratories and x-rays all personally reviewed.  She was seen, interviewed, and examined in the preprocedure area of the endoscopy unit this afternoon.   The various methods of treatment have been discussed with the patient and family. After consideration of risks, benefits and other options for treatment, the patient has consented to  Procedure(s): EGD (ESOPHAGOGASTRODUODENOSCOPY) (N/A) ERCP, WITH INTERVENTION IF INDICATED (N/A) as a surgical intervention.  The patient's history has been reviewed, patient examined, no change in status, stable for surgery.  I have reviewed the patient's chart and labs.  Questions were answered to the patient's satisfaction.     [name]

## 2024-02-06 NOTE — Anesthesia Procedure Notes (Signed)
 Procedure Name: Intubation Date/Time: 02/06/2024 3:02 PM  Performed by: Roslynn Waddell LABOR, CRNAPre-anesthesia Checklist: Patient identified, Emergency Drugs available, Suction available and Patient being monitored Patient Re-evaluated:Patient Re-evaluated prior to induction Oxygen Delivery Method: Circle System Utilized Preoxygenation: Pre-oxygenation with 100% oxygen Induction Type: IV induction Ventilation: Mask ventilation without difficulty Laryngoscope Size: Mac and 3 Grade View: Grade I Tube type: Oral Tube size: 7.0 mm Number of attempts: 1 Airway Equipment and Method: Stylet and Oral airway Placement Confirmation: ETT inserted through vocal cords under direct vision, positive ETCO2 and breath sounds checked- equal and bilateral Secured at: 21 cm Tube secured with: Tape Dental Injury: Teeth and Oropharynx as per pre-operative assessment  Comments: Atraumatic induction/intubation. Dentition and oral mucosa as per preop.

## 2024-02-06 NOTE — Progress Notes (Signed)
 PROGRESS NOTE    Annette Briggs  FMW:[id number] DOB: May 30, 1941 DOA: 02/04/2024 PCP: Eartha Angelia Sieving, MD   Brief Narrative:   83 y.o. female with medical history significant of Crohn's disease managing with Stelara , history of cholecystectomy, GERD, anxiety, depression, essential hypertension, IBS-diarrhea predominant presented with abdominal pain, nausea and vomiting and was referred from [facility] clinic for need for ERCP.  Patient was seen at [facility] a day prior to presentation with mid epigastric abdominal pain with CT of abdomen and pelvis showing gastric wall thickening possibly due to gastritis and common bile duct dilation of 19 mm.  On presentation, she had elevated LFTs including bilirubin but normal lipase and WBCs.  MRCP showed intra and extrahepatic biliary duct dilatation with multiple tiny layering stools in the CBD along with upper pole of left kidney lesion concerning for renal cell carcinoma.  GI was consulted.  Assessment & Plan:   Transaminitis Hyperbilirubinemia History of Crohn's disease managed by Stelara  History of cholecystectomy - Presented with abdominal pain, nausea and vomiting and outpatient imaging had shown gastritis and CBD dilatation. - LFTs and bilirubin elevated on presentation.  MRCP as above.  GI following and planning for possible ERCP today.  Continue IV Protonix . - LFTs improving today.  Monitor.  Follow further GI recommendations. - Continue as needed analgesics and antiemetics  Gastritis -continue IV Protonix  and oral sucralfate   New lesion of upper pole of left kidney with concern for renal cell carcinoma -Prior provider spoke to urology NP who will set up outpatient follow-up with urology  Essential hypertension -Continue metoprolol  succinate  Anxiety and depression Continue Effexor   History of IBS, diarrhea predominance - Stool for GI PCR and C. difficile negative.  Continue as needed  Imodium  Hypokalemia -Mild.  Monitor.  Leukocytosis -Resolved  DVT prophylaxis: SCDs Code Status: Full Family Communication: None at bedside Disposition Plan: Status is: Inpatient Remains inpatient appropriate because: Of severity of illness    Consultants: GI.  Prior hospitalist curb sided with urology  Procedures: None  Antimicrobials: None   Subjective: Patient seen and examined at bedside.  Had abdominal pain overnight.  Denies any current nausea or vomiting.  No fever reported.  Objective: Vitals:   02/05/24 2029 02/05/24 2200 02/06/24 0519 02/06/24 0813  BP: (!) 145/76  112/61 124/78  Pulse: (!) 113  74 81  Resp: 19 18 17 18   Temp: 99.2 F (37.3 C)  98.8 F (37.1 C) 98.2 F (36.8 C)  TempSrc: Oral  Oral Oral  SpO2: 99%  95% 97%  Weight:      Height:        Intake/Output Summary (Last 24 hours) at 02/06/2024 1022 Last data filed at 02/06/2024 0600 Gross per 24 hour  Intake 1023.53 ml  Output --  Net 1023.53 ml   Filed Weights   02/04/24 1553  Weight: 61.2 kg    Examination:  General exam: Appears calm and comfortable  Respiratory system: Bilateral decreased breath sounds at bases, no wheezing Cardiovascular system: S1 & S2 heard, Rate controlled Gastrointestinal system: Abdomen is nondistended, soft and mildly tender. Normal bowel sounds heard. Extremities: No cyanosis, clubbing, edema  Central nervous system: Alert and oriented. No focal neurological deficits. Moving extremities Skin: No rashes, lesions or ulcers Psychiatry: Judgement and insight appear normal. Mood & affect appropriate.     Data Reviewed: I have personally reviewed following labs and imaging studies  CBC: Recent Labs  Lab 02/03/24 1943 02/04/24 1601 02/05/24 0533 02/06/24 9464  WBC 6.0 10.9* 15.2* 8.0  NEUTROABS  --   --   --  6.7  HGB 12.8 13.9 12.1 12.2  HCT 38.7 41.4 35.8* 36.8  MCV 90.2 86.4 88.0 87.8  PLT 192 172 152 150   Basic Metabolic Panel: Recent Labs   Lab 02/03/24 1943 02/04/24 1601 02/04/24 2254 02/05/24 0533 02/06/24 0535  NA 141 138  --  138 136  K 3.4* 3.2*  --  3.8 3.3*  CL 104 103  --  106 104  CO2 25 21*  --  23 23  GLUCOSE 138* 112*  --  111* 114*  BUN 13 11  --  11 13  CREATININE 0.68 0.83  --  0.83 0.86  CALCIUM 8.7* 9.3  --  8.8* 8.6*  MG  --   --  1.8  --   --    GFR: Estimated Creatinine Clearance: 40.2 mL/min (by C-G formula based on SCr of 0.86 mg/dL). Liver Function Tests: Recent Labs  Lab 02/03/24 1943 02/04/24 1601 02/05/24 0533 02/06/24 0535  AST 124* 366* 186* 75*  ALT 99* 616* 372* 220*  ALKPHOS 99 147* 112 113  BILITOT 0.6 6.2* 4.4* 1.8*  PROT 6.6 7.0 5.9* 5.9*  ALBUMIN 3.8 3.7 2.9* 2.9*   Recent Labs  Lab 02/03/24 1943 02/04/24 1601 02/06/24 0535  LIPASE 59* 29 36   No results for input(s): AMMONIA in the last 168 hours. Coagulation Profile: Recent Labs  Lab 02/04/24 2254 02/06/24 0535  INR 1.4* 1.1   Cardiac Enzymes: No results for input(s): CKTOTAL, CKMB, CKMBINDEX, TROPONINI in the last 168 hours. BNP (last 3 results) No results for input(s): PROBNP in the last 8760 hours. HbA1C: No results for input(s): HGBA1C in the last 72 hours. CBG: No results for input(s): GLUCAP in the last 168 hours. Lipid Profile: No results for input(s): CHOL, HDL, LDLCALC, TRIG, CHOLHDL, LDLDIRECT in the last 72 hours. Thyroid Function Tests: No results for input(s): TSH, T4TOTAL, FREET4, T3FREE, THYROIDAB in the last 72 hours. Anemia Panel: No results for input(s): VITAMINB12, FOLATE, FERRITIN, TIBC, IRON, RETICCTPCT in the last 72 hours. Sepsis Labs: Recent Labs  Lab 02/04/24 1826  LATICACIDVEN 1.7    Recent Results (from the past 240 hours)  Blood culture (routine x 2)     Status: None (Preliminary result)   Collection Time: 02/04/24  6:13 PM   Specimen: BLOOD RIGHT ARM  Result Value Ref Range Status   Specimen Description BLOOD RIGHT  ARM  Final   Special Requests   Final    BOTTLES DRAWN AEROBIC AND ANAEROBIC Blood Culture results may not be optimal due to an inadequate volume of blood received in culture bottles   Culture   Final    NO GROWTH 2 DAYS Performed at [facility], [address], [place], [place] [address]    Report Status PENDING  Incomplete  Blood culture (routine x 2)     Status: None (Preliminary result)   Collection Time: 02/04/24  6:21 PM   Specimen: BLOOD RIGHT HAND  Result Value Ref Range Status   Specimen Description BLOOD RIGHT HAND  Final   Special Requests   Final    BOTTLES DRAWN AEROBIC AND ANAEROBIC Blood Culture results may not be optimal due to an inadequate volume of blood received in culture bottles   Culture   Final    NO GROWTH 2 DAYS Performed at [facility], [address] [place], [place] [address]  Report Status PENDING  Incomplete  Gastrointestinal Panel by PCR , Stool     Status: None   Collection Time: 02/04/24  7:19 PM   Specimen: Stool  Result Value Ref Range Status   Campylobacter species NOT DETECTED NOT DETECTED Final   Plesimonas shigelloides NOT DETECTED NOT DETECTED Final   Salmonella species NOT DETECTED NOT DETECTED Final   Yersinia enterocolitica NOT DETECTED NOT DETECTED Final   Vibrio species NOT DETECTED NOT DETECTED Final   Vibrio cholerae NOT DETECTED NOT DETECTED Final   Enteroaggregative E coli (EAEC) NOT DETECTED NOT DETECTED Final   Enteropathogenic E coli (EPEC) NOT DETECTED NOT DETECTED Final   Enterotoxigenic E coli (ETEC) NOT DETECTED NOT DETECTED Final   Shiga like toxin producing E coli (STEC) NOT DETECTED NOT DETECTED Final   Shigella/Enteroinvasive E coli (EIEC) NOT DETECTED NOT DETECTED Final   Cryptosporidium NOT DETECTED NOT DETECTED Final   Cyclospora cayetanensis NOT DETECTED NOT DETECTED Final   Entamoeba histolytica NOT DETECTED NOT DETECTED Final   Giardia lamblia NOT DETECTED NOT DETECTED Final   Adenovirus F40/41  NOT DETECTED NOT DETECTED Final   Astrovirus NOT DETECTED NOT DETECTED Final   Norovirus GI/GII NOT DETECTED NOT DETECTED Final   Rotavirus A NOT DETECTED NOT DETECTED Final   Sapovirus (I, II, IV, and V) NOT DETECTED NOT DETECTED Final    Comment: Performed at [facility], [address] [place], [place] [address]  C Difficile Quick Screen w PCR reflex     Status: None   Collection Time: 02/04/24  7:19 PM   Specimen: Stool  Result Value Ref Range Status   C Diff antigen NEGATIVE NEGATIVE Final   C Diff toxin NEGATIVE NEGATIVE Final   C Diff interpretation No C. difficile detected.  Final    Comment: Performed at [facility], [address] [place], [place] [address]         Radiology Studies: MR 3D Recon At Scanner Result Date: 02/05/2024 CLINICAL DATA:  Abdominal pain with nausea vomiting and diarrhea. Elevated LFTs. History of cholecystectomy. Dilated common bile duct on CT scan yesterday. EXAM: MRI ABDOMEN WITHOUT AND WITH CONTRAST (INCLUDING MRCP) TECHNIQUE: Multiplanar multisequence MR imaging of the abdomen was performed both before and after the administration of intravenous contrast. Heavily T2-weighted images of the biliary and pancreatic ducts were obtained, and three-dimensional MRCP images were rendered by post processing. CONTRAST:  6mL GADAVIST  GADOBUTROL  1 MMOL/ML IV SOLN COMPARISON:  CT scan from yesterday. FINDINGS: Lower chest: Dependent atelectasis in both lung bases. Hepatobiliary: No suspicious focal abnormality within the liver parenchyma. Above intrahepatic biliary duct prominence evident. Common duct measures 14 mm diameter. Common bile duct in the head of the pancreas is 11 mm diameter. Multiple layering tiny stones are seen in the long the dependent mucosal surface of the distal common bile duct (well demonstrated MRCP coronal image 64/9 and axial T2 haste images 19-22 of series 3). Pancreas: No focal mass lesion. No dilatation of the main duct. No  intraparenchymal cyst. No peripancreatic edema. Spleen:  No splenomegaly. No suspicious focal mass lesion. Adrenals/Urinary Tract: No adrenal nodule or mass. Tiny simple cysts identified upper pole right kidney. 11 mm subcapsular T1 isointense,, T2 hypointense upper pole lesion is identified in the left kidney (T2 haste image 17 of series 3). This demonstrates diffuse enhancement after IV contrast administration. Left kidney otherwise unremarkable. Stomach/Bowel: Stomach is decompressed, accentuating wall thickness. Duodenum is normally positioned as is the ligament of Treitz. No  small bowel or colonic dilatation within the visualized abdomen. Vascular/Lymphatic: No abdominal aortic aneurysm. No abdominal lymphadenopathy. Other:  No intraperitoneal free fluid. Musculoskeletal: No focal suspicious marrow enhancement within the visualized bony anatomy. IMPRESSION: 1. Intra and extrahepatic biliary duct dilatation with multiple tiny layering stones in the distal common bile duct. 2. 11 mm enhancing subcapsular lesion upper pole left kidney. Imaging concerning for renal cell carcinoma. Urology consultation recommended. Electronically Signed   By: Camellia Candle M.D.   On: 02/05/2024 07:11   MR ABDOMEN MRCP W WO CONTAST Result Date: 02/05/2024 CLINICAL DATA:  Abdominal pain with nausea vomiting and diarrhea. Elevated LFTs. History of cholecystectomy. Dilated common bile duct on CT scan yesterday. EXAM: MRI ABDOMEN WITHOUT AND WITH CONTRAST (INCLUDING MRCP) TECHNIQUE: Multiplanar multisequence MR imaging of the abdomen was performed both before and after the administration of intravenous contrast. Heavily T2-weighted images of the biliary and pancreatic ducts were obtained, and three-dimensional MRCP images were rendered by post processing. CONTRAST:  6mL GADAVIST  GADOBUTROL  1 MMOL/ML IV SOLN COMPARISON:  CT scan from yesterday. FINDINGS: Lower chest: Dependent atelectasis in both lung bases. Hepatobiliary: No suspicious  focal abnormality within the liver parenchyma. Above intrahepatic biliary duct prominence evident. Common duct measures 14 mm diameter. Common bile duct in the head of the pancreas is 11 mm diameter. Multiple layering tiny stones are seen in the long the dependent mucosal surface of the distal common bile duct (well demonstrated MRCP coronal image 64/9 and axial T2 haste images 19-22 of series 3). Pancreas: No focal mass lesion. No dilatation of the main duct. No intraparenchymal cyst. No peripancreatic edema. Spleen:  No splenomegaly. No suspicious focal mass lesion. Adrenals/Urinary Tract: No adrenal nodule or mass. Tiny simple cysts identified upper pole right kidney. 11 mm subcapsular T1 isointense,, T2 hypointense upper pole lesion is identified in the left kidney (T2 haste image 17 of series 3). This demonstrates diffuse enhancement after IV contrast administration. Left kidney otherwise unremarkable. Stomach/Bowel: Stomach is decompressed, accentuating wall thickness. Duodenum is normally positioned as is the ligament of Treitz. No small bowel or colonic dilatation within the visualized abdomen. Vascular/Lymphatic: No abdominal aortic aneurysm. No abdominal lymphadenopathy. Other:  No intraperitoneal free fluid. Musculoskeletal: No focal suspicious marrow enhancement within the visualized bony anatomy. IMPRESSION: 1. Intra and extrahepatic biliary duct dilatation with multiple tiny layering stones in the distal common bile duct. 2. 11 mm enhancing subcapsular lesion upper pole left kidney. Imaging concerning for renal cell carcinoma. Urology consultation recommended. Electronically Signed   By: Camellia Candle M.D.   On: 02/05/2024 05:26   US  Abdomen Limited RUQ (LIVER/GB) Result Date: 02/04/2024 CLINICAL DATA:  Elevated bilirubin. EXAM: ULTRASOUND ABDOMEN LIMITED RIGHT UPPER QUADRANT COMPARISON:  CT abdomen pelvis 02/03/2024. FINDINGS: Gallbladder: Surgically absent. Common bile duct: Diameter: 9 mm, with  mild intrahepatic biliary ductal dilatation, expected status post cholecystectomy. Liver: No focal lesion identified. Within normal limits in parenchymal echogenicity. Portal vein is patent on color Doppler imaging with normal direction of blood flow towards the liver. Other: None. IMPRESSION: No acute findings. Electronically Signed   By: Newell Eke M.D.   On: 02/04/2024 19:36        Scheduled Meds:  loratadine   10 mg Oral Daily   metoprolol  succinate  25 mg Oral QHS   pantoprazole  (PROTONIX ) IV  40 mg Intravenous Q24H   sodium chloride  flush  3 mL Intravenous Q12H   sodium chloride  flush  3 mL Intravenous Q12H   sucralfate   1 g Oral  TID WC & HS   venlafaxine  XR  37.5 mg Oral Daily   Continuous Infusions:  dextrose  50 mL/hr at 02/06/24 0956          Sophie Mao, MD [facility] 02/06/2024, 10:22 AM

## 2024-02-06 NOTE — Op Note (Signed)
 [facility] Patient Name: Annette Briggs Procedure Date : 02/06/2024 MRN: [id number] Attending MD: Norleen SAILOR. Abran , MD, [id number] Date of Birth: 24-Mar-1941 CSN: [id number] Age: 83 Admit Type: Inpatient Procedure:                ERCP with biliary sphincterotomy and common duct                            stone extraction Indications:              MRCP MRI, Bile duct stones, abdominal pain. The                            patient is status post remote cholecystectomy Providers:                Norleen SAILOR. Abran, MD, Ozell Pouch, Coye Bade, Technician Referring MD:             [facility] Medicines:                General Anesthesia Complications:            No immediate complications. Estimated Blood Loss:     Estimated blood loss: none. Procedure:                Pre-Anesthesia Assessment:                           - Prior to the procedure, a History and Physical                            was performed, and patient medications and                            allergies were reviewed. The patient is competent.                            The risks and benefits of the procedure and the                            sedation options and risks were discussed with the                            patient. All questions were answered and informed                            consent was obtained. Patient identification and                            proposed procedure were verified by the physician.                            Mental Status Examination: alert and oriented.  Airway Examination: normal oropharyngeal airway and                            neck mobility. Respiratory Examination: clear to                            auscultation. CV Examination: normal. Prophylactic                            Antibiotics: The patient does not require                            prophylactic antibiotics. Prior Anticoagulants: The                             patient has taken no anticoagulant or antiplatelet                            agents. ASA Grade Assessment: II - A patient with                            mild systemic disease. After reviewing the risks                            and benefits, the patient was deemed in                            satisfactory condition to undergo the procedure.                            The anesthesia plan was to use moderate sedation /                            analgesia (conscious sedation). Immediately prior                            to administration of medications, the patient was                            re-assessed for adequacy to receive sedatives. The                            heart rate, respiratory rate, oxygen saturations,                            blood pressure, adequacy of pulmonary ventilation,                            and response to care were monitored throughout the                            procedure. The physical status of the patient was  re-assessed after the procedure.                           After obtaining informed consent, the scope was                            passed under direct vision. Throughout the                            procedure, the patient's blood pressure, pulse, and                            oxygen saturations were monitored continuously. The                            [id number]-[id number] ([id number] Olympus endoscope was introduced                            through the mouth, and used to inject contrast into                            and used to inject contrast into the bile duct and                            ventral pancreatic duct. The [id number]-[id number] ([id number])                            Olympus duodenoscope was introduced through the                            mouth, and used to inject contrast into and used to                            inject contrast into the bile duct and ventral                            pancreatic  duct. The ERCP was accomplished without                            difficulty. The patient tolerated the procedure                            well. Scope In: Scope Out: Findings:      1. The side-viewing endoscope was passed blindly into the esophagus. The       stomach and duodenum were thoroughly evaluated and appeared normal as       did the major papilla. The minor papilla was not sought. Images obtained.      2. A scout radiograph of the abdomen with the endoscope and position was       unremarkable      3. Initial injection of contrast yielded a normal partial pancreatogram      4. Biliary cannulation was achieved with the scope in the long position.       Injection of contrast yielded a  markedly dilated bile duct measuring up       to 20 mm. Within the duct were innumerable filling defects consistent       with stones. These were both large and small      5. A biliary sphincterotomy was made cutting over a hydrophilic       guidewire in the 12:00 orientation using the ERBE system. No bleeding.      6. A sequential balloon measuring 12 and 15 mm was used to extract       multiple yellow gallstones (estimated 10-12) up to 15 mm in diameter.      7. Postextraction occlusion cholangiogram was negative for residual       filling defects and demonstrated excellent drainage Impression:               1. Status post ERCP with sphincterotomy and                            extraction of multiple common duct stones as                            described                           2. Unremarkable stomach and duodenum. Recommendation:           1. Standard post ERCP care                           2. Monitor clinical status and laboratories                           3. Full liquid diet.                           4. If doing well, would anticipate discharge home                            tomorrow                           5. Inpatient GI team aware of findings and will                             follow this patient while she is in the hospital                           The results of this procedure were conveyed to the                            patient's daughter [facility] via voicemail. Her number                            is [phone]                           The patient was seen in the postop area and given a  copy of this report. Procedure Code(s):        --- Professional ---                           [phone], Endoscopic retrograde                            cholangiopancreatography (ERCP); with removal of                            calculi/debris from biliary/pancreatic duct(s)                           43262, Endoscopic retrograde                            cholangiopancreatography (ERCP); with                            sphincterotomy/papillotomy Diagnosis Code(s):        --- Professional ---                           Z90.49, Acquired absence of other specified parts                            of digestive tract                           K80.50, Calculus of bile duct without cholangitis                            or cholecystitis without obstruction                           K83.8, Other specified diseases of biliary tract CPT copyright 2022 [employer]. All rights reserved. The codes documented in this report are preliminary and upon coder review may  be revised to meet current compliance requirements. Norleen SAILOR. Abran, MD 02/06/2024 4:43:55 PM This report has been signed electronically. Number of Addenda: 0

## 2024-02-07 DIAGNOSIS — N2889 Other specified disorders of kidney and ureter: Secondary | ICD-10-CM

## 2024-02-07 DIAGNOSIS — Z9049 Acquired absence of other specified parts of digestive tract: Secondary | ICD-10-CM

## 2024-02-07 DIAGNOSIS — R7401 Elevation of levels of liver transaminase levels: Secondary | ICD-10-CM | POA: Diagnosis not present

## 2024-02-07 LAB — CBC WITH DIFFERENTIAL/PLATELET
Abs Immature Granulocytes: 0.02 K/uL (ref 0.00–0.07)
Basophils Absolute: 0 K/uL (ref 0.0–0.1)
Basophils Relative: 0 %
Eosinophils Absolute: 0 K/uL (ref 0.0–0.5)
Eosinophils Relative: 0 %
HCT: 38.8 % (ref 36.0–46.0)
Hemoglobin: 13 g/dL (ref 12.0–15.0)
Immature Granulocytes: 0 %
Lymphocytes Relative: 8 %
Lymphs Abs: 0.5 K/uL — ABNORMAL LOW (ref 0.7–4.0)
MCH: 29.3 pg (ref 26.0–34.0)
MCHC: 33.5 g/dL (ref 30.0–36.0)
MCV: 87.4 fL (ref 80.0–100.0)
Monocytes Absolute: 0.4 K/uL (ref 0.1–1.0)
Monocytes Relative: 7 %
Neutro Abs: 5.1 K/uL (ref 1.7–7.7)
Neutrophils Relative %: 85 %
Platelets: 170 K/uL (ref 150–400)
RBC: 4.44 MIL/uL (ref 3.87–5.11)
RDW: 13.3 % (ref 11.5–15.5)
WBC: 6 K/uL (ref 4.0–10.5)
nRBC: 0 % (ref 0.0–0.2)

## 2024-02-07 LAB — COMPREHENSIVE METABOLIC PANEL WITH GFR
ALT: 199 U/L — ABNORMAL HIGH (ref 0–44)
AST: 88 U/L — ABNORMAL HIGH (ref 15–41)
Albumin: 2.9 g/dL — ABNORMAL LOW (ref 3.5–5.0)
Alkaline Phosphatase: 175 U/L — ABNORMAL HIGH (ref 38–126)
Anion gap: 11 (ref 5–15)
BUN: 10 mg/dL (ref 8–23)
CO2: 25 mmol/L (ref 22–32)
Calcium: 9 mg/dL (ref 8.9–10.3)
Chloride: 104 mmol/L (ref 98–111)
Creatinine, Ser: 0.8 mg/dL (ref 0.44–1.00)
GFR, Estimated: >60 mL/min (ref >60)
Glucose, Bld: 127 mg/dL — ABNORMAL HIGH (ref 70–99)
Potassium: 3.6 mmol/L (ref 3.5–5.1)
Sodium: 140 mmol/L (ref 135–145)
Total Bilirubin: 3.3 mg/dL — ABNORMAL HIGH (ref 0.0–1.2)
Total Protein: 6.4 g/dL — ABNORMAL LOW (ref 6.5–8.1)

## 2024-02-07 LAB — CALPROTECTIN, FECAL: Calprotectin, Fecal: 179 ug/g — ABNORMAL HIGH (ref 0–120)

## 2024-02-07 LAB — MAGNESIUM: Magnesium: 1.9 mg/dL (ref 1.7–2.4)

## 2024-02-07 MED ORDER — ONDANSETRON HCL 4 MG PO TABS: 4.0000 mg | ORAL_TABLET | Freq: Four times a day (QID) | ORAL | 0 refills | Status: DC | PRN

## 2024-02-07 MED ORDER — ALUM & MAG HYDROXIDE-SIMETH 200-200-20 MG/5ML PO SUSP: 15.0000 mL | Freq: Four times a day (QID) | ORAL | 0 refills | Status: DC | PRN

## 2024-02-07 MED ORDER — TRAMADOL HCL 50 MG PO TABS: 50.0000 mg | ORAL_TABLET | Freq: Four times a day (QID) | ORAL | 0 refills | Status: DC | PRN

## 2024-02-07 NOTE — TOC Transition Note (Signed)
 Transition of Care [facility]) - Discharge Note   Patient Details  Name: Annette Briggs MRN: [id number] Date of Birth: 1941/02/19  Transition [facility]) CM/SW Contact:  Tom-Johnson, Harvest Muskrat, RN Phone Number: 02/07/2024, 12:48 PM   Clinical Narrative:     Patient is scheduled for discharge today.  Readmission Risk Assessment done. Outpatient referral, hospital f/u and discharge instructions on AVS. No TOC needs or recommendations noted. Daughter, [name] to transport at discharge.  No further TOC needs noted.      Final next level of care: Home/Self Care Barriers to Discharge: Barriers Resolved   Patient Goals and CMS Choice Patient states their goals for this hospitalization and ongoing recovery are:: To return home CMS Medicare.gov Compare Post Acute Care list provided to:: Patient Choice offered to / list presented to : NA      Discharge Placement                Patient to be transferred to facility by: Daughter Name of family member notified: [facility]    Discharge Plan and Services Additional resources added to the After Visit Summary for                  DME Arranged: N/A DME Agency: NA       HH Arranged: NA HH Agency: NA        Social Drivers of Health (SDOH) Interventions SDOH Screenings   Food Insecurity: No Food Insecurity (02/05/2024)  Housing: Low Risk  (02/05/2024)  Transportation Needs: No Transportation Needs (02/05/2024)  Utilities: Not At Risk (02/05/2024)  Social Connections: Unknown (02/05/2024)  Tobacco Use: Low Risk  (02/06/2024)     Readmission Risk Interventions    02/05/2024    1:07 PM  Readmission Risk Prevention Plan  Post Dischage Appt Complete  Medication Screening Complete  Transportation Screening Complete

## 2024-02-07 NOTE — Discharge Summary (Signed)
 Physician Discharge Summary  Annette Briggs FMW:[id number] DOB: 12-14-1940 DOA: 02/04/2024  PCP: Eartha Angelia Sieving, MD  Admit date: 02/04/2024 Discharge date: 02/07/2024  Admitted From: Home Disposition: Home  Recommendations for Outpatient Follow-up:  Follow up with PCP in 1 week with repeat CBC/BMP Outpatient follow-up with GI.  Outpatient evaluation and follow-up with urology Follow up in ED if symptoms worsen or new appear   Home Health: No Equipment/Devices: None  Discharge Condition: Stable CODE STATUS: Full Diet recommendation: Heart healthy  Brief/Interim Summary:  83 y.o. female with medical history significant of Crohn's disease managing with Stelara , history of cholecystectomy, GERD, anxiety, depression, essential hypertension, IBS-diarrhea predominant presented with abdominal pain, nausea and vomiting and was referred from [facility] gastroenterology clinic for need for ERCP.  Patient was seen at [facility] a day prior to presentation with mid epigastric abdominal pain with CT of abdomen and pelvis showing gastric wall thickening possibly due to gastritis and common bile duct dilation of 19 mm.  On presentation, she had elevated LFTs including bilirubin but normal lipase and WBCs.  MRCP showed intra and extrahepatic biliary duct dilatation with multiple tiny layering stools in the CBD along with upper pole of left kidney lesion concerning for renal cell carcinoma.  GI was consulted.  She underwent ERCP on 02/06/2024 and removal of 10 to 12 stones.  Subsequently, she is tolerating diet.  GI has cleared the patient for discharge if she tolerates soft diet today.  She will be discharged home today with outpatient follow-up with PCP, GI and urology.  Discharge Diagnoses:   Transaminitis Hyperbilirubinemia History of Crohn's disease managed by Stelara  as an outpatient History of cholecystectomy - Presented with abdominal pain, nausea and vomiting and outpatient imaging  had shown gastritis and CBD dilatation. - LFTs and bilirubin elevated on presentation.  MRCP as above.  GI following and planning for possible ERCP today.  Continue IV Protonix . - She underwent ERCP on 02/06/2024 and removal of 10 to 12 stones.  Subsequently, she is tolerating diet.  GI has cleared the patient for discharge if she tolerates soft diet for lunch.  She will be discharged home today with outpatient follow-up with PCP, GI   Gastritis - EGD unremarkable.  Continue oral Protonix .  Outpatient follow-up with GI.   New lesion of upper pole of left kidney with concern for renal cell carcinoma -Prior provider spoke to urology NP who will set up outpatient follow-up with urology   Essential hypertension -Continue metoprolol  succinate   Anxiety and depression -Continue Effexor    History of IBS, diarrhea predominance - Stool for GI PCR and C. difficile negative.  Continue as needed Imodium   Hypokalemia - Improved.   Leukocytosis -Resolved  Discharge Instructions  Discharge Instructions     Ambulatory referral to Gastroenterology   Complete by: As directed    Hospital follow up   What is the reason for referral?: Other   Diet - low sodium heart healthy   Complete by: As directed    Increase activity slowly   Complete by: As directed       Allergies as of 02/07/2024       Reactions   Ciprofloxacin Other (See Comments)   Patient can't remember reaction.   Pneumovax [pneumococcal Polysaccharide Vaccine] Swelling   Wellbutrin [bupropion] Anxiety   Anxiety attack        Medication List     STOP taking these medications    acetaminophen  500 MG tablet Commonly known as: TYLENOL   Airborne Chew   atorvastatin 10 MG tablet Commonly known as: LIPITOR   famotidine  20 MG tablet Commonly known as: Pepcid    OSTEO BI-FLEX ADV JOINT SHIELD PO   OVER THE COUNTER MEDICATION   OVER THE COUNTER MEDICATION       TAKE these medications    alum & mag  hydroxide-simeth 200-200-20 MG/5ML suspension Commonly known as: MAALOX/MYLANTA Take 15 mLs by mouth every 6 (six) hours as needed for indigestion, heartburn or flatulence.   Dialyvite Vitamin D  5000 125 MCG (5000 UT) capsule Generic drug: Cholecalciferol Take 5,000 Units by mouth daily.   IMODIUM PO Take 2-4 mg by mouth every 4 (four) hours as needed (Diarrhea).   loratadine  10 MG tablet Commonly known as: CLARITIN  Take 10 mg by mouth daily.   Metamucil Smooth Texture 58.6 % powder Generic drug: psyllium Take 1 packet by mouth at bedtime.   metoprolol  succinate 25 MG 24 hr tablet Commonly known as: TOPROL -XL Take 25 mg by mouth at bedtime.   multivitamin tablet Take 1 tablet by mouth daily.   ondansetron  4 MG tablet Commonly known as: ZOFRAN  Take 1 tablet (4 mg total) by mouth every 6 (six) hours as needed for nausea.   pantoprazole  40 MG tablet Commonly known as: PROTONIX  Take 1 tablet (40 mg total) by mouth daily.   traMADol  50 MG tablet Commonly known as: Ultram  Take 1 tablet (50 mg total) by mouth every 6 (six) hours as needed for severe pain (pain score 7-10).   venlafaxine  XR 37.5 MG 24 hr capsule Commonly known as: EFFEXOR -XR Take 37.5 mg by mouth daily.   Wezlana  90 MG/ML Sosy Generic drug: Ustekinumab -auub Inject 90 mg into the skin every 6 (six) weeks.        Follow-up Information     Eartha Flavors, Toribio, MD. Schedule an appointment as soon as possible for a visit in 1 week(s).   Specialty: Gastroenterology Why: With repeat CBC/CMP Contact information: [address] [place] [address] [phone]         [facility]. Schedule an appointment as soon as possible for a visit in 1 week(s).   Contact information: [address] [place]  [address] [phone]               Allergies  Allergen Reactions   Ciprofloxacin Other (See Comments)    Patient can't remember reaction.    Pneumovax [Pneumococcal Polysaccharide Vaccine] Swelling   Wellbutrin [Bupropion] Anxiety    Anxiety attack    Consultations: GI. Prior hospitalist curb sided with urology    Procedures/Studies: DG ERCP Result Date: 02/07/2024 CLINICAL DATA:  Common bile duct stones seen on MRCP. Abdominal pain. Remote history of cholecystectomy peer EXAM: ERCP 21 fluoroscopic images of the right upper quadrant submitted for interpretation TECHNIQUE: Multiple spot images obtained with the fluoroscopic device and submitted for interpretation post-procedure. FLUOROSCOPY: Radiation Exposure Index (as provided by the fluoroscopic device): 81.044 mGy Kerma COMPARISON:  None Available. FINDINGS: Intra procedural fluoroscopic images of the quadrant with flexible endoscopy device. Guidewire present the common bile duct which is markedly enlarged and demonstrates filling consistent with common bile duct stones. Balloon sweep appears successful in resolving choledocholithiasis. IMPRESSION: Intraoperative ERCP. These images were submitted for radiologic interpretation only. Please see the procedural report for the amount of contrast and the fluoroscopy time utilized. Electronically Signed   By: Cordella Banner   On: 02/07/2024 09:11   DG C-Arm 1-60 Min-No Report Result Date:  02/06/2024 Fluoroscopy was utilized by the requesting physician.  No radiographic interpretation.   MR 3D Recon At Scanner Result Date: 02/05/2024 CLINICAL DATA:  Abdominal pain with nausea vomiting and diarrhea. Elevated LFTs. History of cholecystectomy. Dilated common bile duct on CT scan yesterday. EXAM: MRI ABDOMEN WITHOUT AND WITH CONTRAST (INCLUDING MRCP) TECHNIQUE: Multiplanar multisequence MR imaging of the abdomen was performed both before and after the administration of intravenous contrast. Heavily T2-weighted images of the biliary and pancreatic ducts were obtained, and three-dimensional MRCP images were rendered by post processing. CONTRAST:   6mL GADAVIST  GADOBUTROL  1 MMOL/ML IV SOLN COMPARISON:  CT scan from yesterday. FINDINGS: Lower chest: Dependent atelectasis in both lung bases. Hepatobiliary: No suspicious focal abnormality within the liver parenchyma. Above intrahepatic biliary duct prominence evident. Common duct measures 14 mm diameter. Common bile duct in the head of the pancreas is 11 mm diameter. Multiple layering tiny stones are seen in the long the dependent mucosal surface of the distal common bile duct (well demonstrated MRCP coronal image 64/9 and axial T2 haste images 19-22 of series 3). Pancreas: No focal mass lesion. No dilatation of the main duct. No intraparenchymal cyst. No peripancreatic edema. Spleen:  No splenomegaly. No suspicious focal mass lesion. Adrenals/Urinary Tract: No adrenal nodule or mass. Tiny simple cysts identified upper pole right kidney. 11 mm subcapsular T1 isointense,, T2 hypointense upper pole lesion is identified in the left kidney (T2 haste image 17 of series 3). This demonstrates diffuse enhancement after IV contrast administration. Left kidney otherwise unremarkable. Stomach/Bowel: Stomach is decompressed, accentuating wall thickness. Duodenum is normally positioned as is the ligament of Treitz. No small bowel or colonic dilatation within the visualized abdomen. Vascular/Lymphatic: No abdominal aortic aneurysm. No abdominal lymphadenopathy. Other:  No intraperitoneal free fluid. Musculoskeletal: No focal suspicious marrow enhancement within the visualized bony anatomy. IMPRESSION: 1. Intra and extrahepatic biliary duct dilatation with multiple tiny layering stones in the distal common bile duct. 2. 11 mm enhancing subcapsular lesion upper pole left kidney. Imaging concerning for renal cell carcinoma. Urology consultation recommended. Electronically Signed   By: Camellia Candle M.D.   On: 02/05/2024 07:11   MR ABDOMEN MRCP W WO CONTAST Result Date: 02/05/2024 CLINICAL DATA:  Abdominal pain with nausea  vomiting and diarrhea. Elevated LFTs. History of cholecystectomy. Dilated common bile duct on CT scan yesterday. EXAM: MRI ABDOMEN WITHOUT AND WITH CONTRAST (INCLUDING MRCP) TECHNIQUE: Multiplanar multisequence MR imaging of the abdomen was performed both before and after the administration of intravenous contrast. Heavily T2-weighted images of the biliary and pancreatic ducts were obtained, and three-dimensional MRCP images were rendered by post processing. CONTRAST:  6mL GADAVIST  GADOBUTROL  1 MMOL/ML IV SOLN COMPARISON:  CT scan from yesterday. FINDINGS: Lower chest: Dependent atelectasis in both lung bases. Hepatobiliary: No suspicious focal abnormality within the liver parenchyma. Above intrahepatic biliary duct prominence evident. Common duct measures 14 mm diameter. Common bile duct in the head of the pancreas is 11 mm diameter. Multiple layering tiny stones are seen in the long the dependent mucosal surface of the distal common bile duct (well demonstrated MRCP coronal image 64/9 and axial T2 haste images 19-22 of series 3). Pancreas: No focal mass lesion. No dilatation of the main duct. No intraparenchymal cyst. No peripancreatic edema. Spleen:  No splenomegaly. No suspicious focal mass lesion. Adrenals/Urinary Tract: No adrenal nodule or mass. Tiny simple cysts identified upper pole right kidney. 11 mm subcapsular T1 isointense,, T2 hypointense upper pole lesion is identified in the left kidney (T2 haste  image 17 of series 3). This demonstrates diffuse enhancement after IV contrast administration. Left kidney otherwise unremarkable. Stomach/Bowel: Stomach is decompressed, accentuating wall thickness. Duodenum is normally positioned as is the ligament of Treitz. No small bowel or colonic dilatation within the visualized abdomen. Vascular/Lymphatic: No abdominal aortic aneurysm. No abdominal lymphadenopathy. Other:  No intraperitoneal free fluid. Musculoskeletal: No focal suspicious marrow enhancement within  the visualized bony anatomy. IMPRESSION: 1. Intra and extrahepatic biliary duct dilatation with multiple tiny layering stones in the distal common bile duct. 2. 11 mm enhancing subcapsular lesion upper pole left kidney. Imaging concerning for renal cell carcinoma. Urology consultation recommended. Electronically Signed   By: Camellia Candle M.D.   On: 02/05/2024 05:26   US  Abdomen Limited RUQ (LIVER/GB) Result Date: 02/04/2024 CLINICAL DATA:  Elevated bilirubin. EXAM: ULTRASOUND ABDOMEN LIMITED RIGHT UPPER QUADRANT COMPARISON:  CT abdomen pelvis 02/03/2024. FINDINGS: Gallbladder: Surgically absent. Common bile duct: Diameter: 9 mm, with mild intrahepatic biliary ductal dilatation, expected status post cholecystectomy. Liver: No focal lesion identified. Within normal limits in parenchymal echogenicity. Portal vein is patent on color Doppler imaging with normal direction of blood flow towards the liver. Other: None. IMPRESSION: No acute findings. Electronically Signed   By: Newell Eke M.D.   On: 02/04/2024 19:36   CT ABDOMEN PELVIS W CONTRAST Result Date: 02/03/2024 CLINICAL DATA:  Pain across her stomach. EXAM: CT ABDOMEN AND PELVIS WITH CONTRAST TECHNIQUE: Multidetector CT imaging of the abdomen and pelvis was performed using the standard protocol following bolus administration of intravenous contrast. RADIATION DOSE REDUCTION: This exam was performed according to the departmental dose-optimization program which includes automated exposure control, adjustment of the mA and/or kV according to patient size and/or use of iterative reconstruction technique. CONTRAST:  100mL OMNIPAQUE  IOHEXOL  300 MG/ML  SOLN COMPARISON:  None Available. FINDINGS: Lower chest: No acute abnormality. Hepatobiliary: No focal liver abnormality is seen. Status post cholecystectomy. The common bile duct measures 19 mm. Pancreas: Unremarkable. No pancreatic ductal dilatation or surrounding inflammatory changes. Spleen: Normal in size  without focal abnormality. Adrenals/Urinary Tract: Adrenal glands are unremarkable. Kidneys are normal, without renal calculi, focal lesion, or hydronephrosis. Bladder is unremarkable. Stomach/Bowel: Mild-to-moderate severity asymmetric mural wall thickening is seen along the anterior and inferior aspects of the gastric body (best seen on coronal reformatted images 15 through 23, CT series 3). The appendix is surgically absent. No evidence of bowel wall thickening, distention, or inflammatory changes. Noninflamed diverticula are seen throughout the descending and sigmoid colon. Vascular/Lymphatic: Aortic atherosclerosis. No enlarged abdominal or pelvic lymph nodes. Reproductive: Status post hysterectomy. No adnexal masses. Other: No abdominal wall hernia or abnormality. No abdominopelvic ascites. Musculoskeletal: No acute or significant osseous findings. IMPRESSION: 1. Asymmetric gastric wall thickening involving the body of the stomach, as described above. While this may be, in part, related to poor gastric distension, sequelae associated with gastritis cannot be excluded. Gastric consult and subsequent nonemergent abdomen pelvis CT follow-up is recommended to determine stability and exclude the presence of an underlying neoplastic process. 2. Colonic diverticulosis. 3. Evidence of prior cholecystectomy, appendectomy and hysterectomy. 4. Aortic atherosclerosis. Electronically Signed   By: Suzen Dials M.D.   On: 02/03/2024 22:32   DG Chest 2 View Result Date: 02/03/2024 CLINICAL DATA:  Chest pain EXAM: DG CHEST 2V COMPARISON:  07/10/2022 FINDINGS: The heart size and mediastinal contours are within normal limits. Both lungs are clear. The visualized skeletal structures are unremarkable. IMPRESSION: No active cardiopulmonary disease. Electronically Signed   By: Luke Scott HERO.D.  On: 02/03/2024 20:11      Subjective: Patient seen and examined at bedside.  Feels better and is tolerating diet.  Feels a  bit of vomited.  No fever, vomiting, worsening abdominal pain reported.  Discharge Exam: Vitals:   02/07/24 0445 02/07/24 0821  BP: (!) 148/82 134/61  Pulse: 80 80  Resp: 18 18  Temp: 98.1 F (36.7 C) 97.8 F (36.6 C)  SpO2: 99% 95%    General: Pt is alert, awake, not in acute distress.  Elderly female lying in bed.  On room air. Cardiovascular: rate controlled, S1/S2 + Respiratory: bilateral decreased breath sounds at bases Abdominal: Soft, NT, ND, bowel sounds + Extremities: no edema, no cyanosis    The results of significant diagnostics from this hospitalization (including imaging, microbiology, ancillary and laboratory) are listed below for reference.     Microbiology: Recent Results (from the past 240 hours)  Blood culture (routine x 2)     Status: None (Preliminary result)   Collection Time: 02/04/24  6:13 PM   Specimen: BLOOD RIGHT ARM  Result Value Ref Range Status   Specimen Description BLOOD RIGHT ARM  Final   Special Requests   Final    BOTTLES DRAWN AEROBIC AND ANAEROBIC Blood Culture results may not be optimal due to an inadequate volume of blood received in culture bottles   Culture   Final    NO GROWTH 2 DAYS Performed at [facility], [address] [place], [place] [address]    Report Status PENDING  Incomplete  Blood culture (routine x 2)     Status: None (Preliminary result)   Collection Time: 02/04/24  6:21 PM   Specimen: BLOOD RIGHT HAND  Result Value Ref Range Status   Specimen Description BLOOD RIGHT HAND  Final   Special Requests   Final    BOTTLES DRAWN AEROBIC AND ANAEROBIC Blood Culture results may not be optimal due to an inadequate volume of blood received in culture bottles   Culture   Final    NO GROWTH 2 DAYS Performed at [facility], [address], [place], [place] [address]    Report Status PENDING  Incomplete  Gastrointestinal Panel by PCR , Stool     Status: None   Collection Time: 02/04/24  7:19 PM   Specimen:  Stool  Result Value Ref Range Status   Campylobacter species NOT DETECTED NOT DETECTED Final   Plesimonas shigelloides NOT DETECTED NOT DETECTED Final   Salmonella species NOT DETECTED NOT DETECTED Final   Yersinia enterocolitica NOT DETECTED NOT DETECTED Final   Vibrio species NOT DETECTED NOT DETECTED Final   Vibrio cholerae NOT DETECTED NOT DETECTED Final   Enteroaggregative E coli (EAEC) NOT DETECTED NOT DETECTED Final   Enteropathogenic E coli (EPEC) NOT DETECTED NOT DETECTED Final   Enterotoxigenic E coli (ETEC) NOT DETECTED NOT DETECTED Final   Shiga like toxin producing E coli (STEC) NOT DETECTED NOT DETECTED Final   Shigella/Enteroinvasive E coli (EIEC) NOT DETECTED NOT DETECTED Final   Cryptosporidium NOT DETECTED NOT DETECTED Final   Cyclospora cayetanensis NOT DETECTED NOT DETECTED Final   Entamoeba histolytica NOT DETECTED NOT DETECTED Final   Giardia lamblia NOT DETECTED NOT DETECTED Final   Adenovirus F40/41 NOT DETECTED NOT DETECTED Final   Astrovirus NOT DETECTED NOT DETECTED Final   Norovirus GI/GII NOT DETECTED NOT DETECTED Final   Rotavirus A NOT DETECTED NOT DETECTED Final   Sapovirus (I, II, IV, and V) NOT DETECTED NOT DETECTED  Final    Comment: Performed at [facility], [address] [place], [place] [address]  C Difficile Quick Screen w PCR reflex     Status: None   Collection Time: 02/04/24  7:19 PM   Specimen: Stool  Result Value Ref Range Status   C Diff antigen NEGATIVE NEGATIVE Final   C Diff toxin NEGATIVE NEGATIVE Final   C Diff interpretation No C. difficile detected.  Final    Comment: Performed at [facility], [address], [place], [place] [address]     Labs: BNP (last 3 results) No results for input(s): BNP in the last 8760 hours. Basic Metabolic Panel: Recent Labs  Lab 02/03/24 1943 02/04/24 1601 02/04/24 2254 02/05/24 0533 02/06/24 0535 02/07/24 0512  NA 141 138  --  138 136 140  K 3.4* 3.2*  --  3.8 3.3* 3.6   CL 104 103  --  106 104 104  CO2 25 21*  --  23 23 25   GLUCOSE 138* 112*  --  111* 114* 127*  BUN 13 11  --  11 13 10   CREATININE 0.68 0.83  --  0.83 0.86 0.80  CALCIUM 8.7* 9.3  --  8.8* 8.6* 9.0  MG  --   --  1.8  --   --  1.9   Liver Function Tests: Recent Labs  Lab 02/03/24 1943 02/04/24 1601 02/05/24 0533 02/06/24 0535 02/07/24 0512  AST 124* 366* 186* 75* 88*  ALT 99* 616* 372* 220* 199*  ALKPHOS 99 147* 112 113 175*  BILITOT 0.6 6.2* 4.4* 1.8* 3.3*  PROT 6.6 7.0 5.9* 5.9* 6.4*  ALBUMIN 3.8 3.7 2.9* 2.9* 2.9*   Recent Labs  Lab 02/03/24 1943 02/04/24 1601 02/06/24 0535  LIPASE 59* 29 36   No results for input(s): AMMONIA in the last 168 hours. CBC: Recent Labs  Lab 02/03/24 1943 02/04/24 1601 02/05/24 0533 02/06/24 0535 02/07/24 0512  WBC 6.0 10.9* 15.2* 8.0 6.0  NEUTROABS  --   --   --  6.7 5.1  HGB 12.8 13.9 12.1 12.2 13.0  HCT 38.7 41.4 35.8* 36.8 38.8  MCV 90.2 86.4 88.0 87.8 87.4  PLT 192 172 152 150 170   Cardiac Enzymes: No results for input(s): CKTOTAL, CKMB, CKMBINDEX, TROPONINI in the last 168 hours. BNP: Invalid input(s): POCBNP CBG: No results for input(s): GLUCAP in the last 168 hours. D-Dimer No results for input(s): DDIMER in the last 72 hours. Hgb A1c No results for input(s): HGBA1C in the last 72 hours. Lipid Profile No results for input(s): CHOL, HDL, LDLCALC, TRIG, CHOLHDL, LDLDIRECT in the last 72 hours. Thyroid function studies No results for input(s): TSH, T4TOTAL, T3FREE, THYROIDAB in the last 72 hours.  Invalid input(s): FREET3 Anemia work up No results for input(s): VITAMINB12, FOLATE, FERRITIN, TIBC, IRON, RETICCTPCT in the last 72 hours. Urinalysis    Component Value Date/Time   COLORURINE AMBER (A) 02/04/2024 2241   APPEARANCEUR CLEAR 02/04/2024 2241   LABSPEC 1.015 02/04/2024 2241   PHURINE 6.0 02/04/2024 2241   GLUCOSEU NEGATIVE 02/04/2024 2241   HGBUR  SMALL (A) 02/04/2024 2241   BILIRUBINUR SMALL (A) 02/04/2024 2241   KETONESUR 5 (A) 02/04/2024 2241   PROTEINUR 30 (A) 02/04/2024 2241   NITRITE NEGATIVE 02/04/2024 2241   LEUKOCYTESUR SMALL (A) 02/04/2024 2241   Sepsis Labs Recent Labs  Lab 02/04/24 1601 02/05/24 0533 02/06/24 0535 02/07/24 0512  WBC 10.9* 15.2* 8.0 6.0   Microbiology Recent Results (from the  past 240 hours)  Blood culture (routine x 2)     Status: None (Preliminary result)   Collection Time: 02/04/24  6:13 PM   Specimen: BLOOD RIGHT ARM  Result Value Ref Range Status   Specimen Description BLOOD RIGHT ARM  Final   Special Requests   Final    BOTTLES DRAWN AEROBIC AND ANAEROBIC Blood Culture results may not be optimal due to an inadequate volume of blood received in culture bottles   Culture   Final    NO GROWTH 2 DAYS Performed at [facility], [address] [place], [place] [address]    Report Status PENDING  Incomplete  Blood culture (routine x 2)     Status: None (Preliminary result)   Collection Time: 02/04/24  6:21 PM   Specimen: BLOOD RIGHT HAND  Result Value Ref Range Status   Specimen Description BLOOD RIGHT HAND  Final   Special Requests   Final    BOTTLES DRAWN AEROBIC AND ANAEROBIC Blood Culture results may not be optimal due to an inadequate volume of blood received in culture bottles   Culture   Final    NO GROWTH 2 DAYS Performed at [facility], [address] [place], [place] [address]    Report Status PENDING  Incomplete  Gastrointestinal Panel by PCR , Stool     Status: None   Collection Time: 02/04/24  7:19 PM   Specimen: Stool  Result Value Ref Range Status   Campylobacter species NOT DETECTED NOT DETECTED Final   Plesimonas shigelloides NOT DETECTED NOT DETECTED Final   Salmonella species NOT DETECTED NOT DETECTED Final   Yersinia enterocolitica NOT DETECTED NOT DETECTED Final   Vibrio species NOT DETECTED NOT DETECTED Final   Vibrio cholerae NOT DETECTED NOT  DETECTED Final   Enteroaggregative E coli (EAEC) NOT DETECTED NOT DETECTED Final   Enteropathogenic E coli (EPEC) NOT DETECTED NOT DETECTED Final   Enterotoxigenic E coli (ETEC) NOT DETECTED NOT DETECTED Final   Shiga like toxin producing E coli (STEC) NOT DETECTED NOT DETECTED Final   Shigella/Enteroinvasive E coli (EIEC) NOT DETECTED NOT DETECTED Final   Cryptosporidium NOT DETECTED NOT DETECTED Final   Cyclospora cayetanensis NOT DETECTED NOT DETECTED Final   Entamoeba histolytica NOT DETECTED NOT DETECTED Final   Giardia lamblia NOT DETECTED NOT DETECTED Final   Adenovirus F40/41 NOT DETECTED NOT DETECTED Final   Astrovirus NOT DETECTED NOT DETECTED Final   Norovirus GI/GII NOT DETECTED NOT DETECTED Final   Rotavirus A NOT DETECTED NOT DETECTED Final   Sapovirus (I, II, IV, and V) NOT DETECTED NOT DETECTED Final    Comment: Performed at [facility], [address] [place], [place] [address]  C Difficile Quick Screen w PCR reflex     Status: None   Collection Time: 02/04/24  7:19 PM   Specimen: Stool  Result Value Ref Range Status   C Diff antigen NEGATIVE NEGATIVE Final   C Diff toxin NEGATIVE NEGATIVE Final   C Diff interpretation No C. difficile detected.  Final    Comment: Performed at [facility], [address], [place], [place] [address]     Time coordinating discharge: 35 minutes  SIGNED:   Sophie Mao, MD  [facility] 02/07/2024, 10:10 AM

## 2024-02-07 NOTE — Progress Notes (Signed)
 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  Choledocholithiasis/epigastric pain --successful ERCP yesterday with extraction of multiple stones.  Liver enzymes not yet improving but not unexpected.  No clinical sign of post ERCP complication -- Okay for discharge from [place] Briggs perspective -- She should follow-up with Dr. Eartha as scheduled later this month and have a repeat hepatic function panel at that time -- She will be discharged with short course of oral pain medication per hospitalist medicine  2.  Ileocolonic Crohn's disease --she will continue Stelara  and follow-up with Dr. Eartha  3.  Left renal lesion --she already has a pending outpatient urology evaluation.  Briggs will sign off, call if questions    Chief Complaint   Had full liquid diet for breakfast Minor upper abdominal discomfort last night after eating soft foods No nausea or vomiting Some flatus No fever Feels like she can go home successfully  Vital signs in last 24 hours: Temp:  [97.3 F (36.3 C)-98.1 F (36.7 C)] 97.8 F (36.6 C) (08/07 0821) Pulse Rate:  [80-90] 80 (08/07 0821) Resp:  [13-18] 18 (08/07 0821) BP: (134-170)/(61-85) 134/61 (08/07 0821) SpO2:  [93 %-99 %] 95 % (08/07 0821) Last BM Date : 02/05/24 Gen: awake, alert, NAD HEENT: anicteric  CV: RRR, no mrg Pulm: CTA b/l Abd: soft, NT/ND, +BS throughout Ext: no  c/c/e Neuro: nonfocal  Intake/Output from previous day: 08/06 0701 - 08/07 0700 In: 1632.5 [P.O.:240; I.V.:1292.5; IV Piggyback:100] Out: 303 [Urine:300; Blood:3] Intake/Output this shift: No intake/output data recorded.  Lab Results: Recent Labs    02/05/24 0533 02/06/24 0535 02/07/24 0512  WBC 15.2* 8.0 6.0  HGB 12.1 12.2 13.0  HCT 35.8* 36.8 38.8  PLT 152 150 170   BMET Recent Labs    02/05/24 0533 02/06/24 0535 02/07/24 0512  NA 138 136 140  K 3.8 3.3* 3.6  CL 106 104 104  CO2 23 23 25   GLUCOSE 111* 114* 127*  BUN 11 13 10   CREATININE 0.83 0.86 0.80  CALCIUM 8.8* 8.6* 9.0   LFT Recent Labs    02/07/24 0512  PROT 6.4*  ALBUMIN 2.9*  AST 88*  ALT 199*  ALKPHOS 175*  BILITOT 3.3*   PT/INR Recent Labs    02/04/24 2254 02/06/24 0535  LABPROT 17.7* 15.0  INR 1.4* 1.1   Hepatitis Panel Recent Labs    02/04/24 2254  HEPBSAG NON REACTIVE  HCVAB NON REACTIVE  HEPAIGM NON REACTIVE  HEPBIGM NON REACTIVE    Studies/Results: DG ERCP Result Date: 02/07/2024 CLINICAL DATA:  Common bile duct stones seen on MRCP. Abdominal pain. Remote history of cholecystectomy peer EXAM: ERCP 21 fluoroscopic images of the right upper quadrant submitted for interpretation TECHNIQUE: Multiple spot images obtained with the fluoroscopic device and submitted for interpretation post-procedure. FLUOROSCOPY: Radiation Exposure Index (as provided by the fluoroscopic device): 81.044 mGy Kerma COMPARISON:  None Available. FINDINGS: Intra procedural fluoroscopic images of the quadrant with flexible endoscopy  device. Guidewire present the common bile duct which is markedly enlarged and demonstrates filling consistent with common bile duct stones. Balloon sweep appears successful in resolving choledocholithiasis. IMPRESSION: Intraoperative ERCP. These images were submitted for radiologic interpretation only. Please see the procedural report for the amount of contrast and the fluoroscopy time  utilized. Electronically Signed   By: Annette Briggs   On: 02/07/2024 09:11   DG C-Arm 1-60 Min-No Report Result Date: 02/06/2024 Fluoroscopy was utilized by the requesting physician.  No radiographic interpretation.      LOS: 3 days   Annette [name], MD 02/07/2024, 2:19 PM See Annette Briggs, Annette Briggs, to contact our on call provider

## 2024-02-07 NOTE — Progress Notes (Signed)
 Patient discharged to home with daughter. Patient satisfied with treatment received from hospital staff. AVS reviewed with patient. Patient verbalized understanding. PIV removed from patients arm. Patient escorted to main entrance by RN.

## 2024-02-07 NOTE — Anesthesia Postprocedure Evaluation (Signed)
 Anesthesia Post Note  Patient: Annette Briggs  Procedure(s) Performed: ERCP, WITH INTERVENTION IF INDICATED     Patient location during evaluation: PACU Anesthesia Type: General Level of consciousness: awake and alert Pain management: pain level controlled Vital Signs Assessment: post-procedure vital signs reviewed and stable Respiratory status: spontaneous breathing, nonlabored ventilation, respiratory function stable and patient connected to nasal cannula oxygen Cardiovascular status: blood pressure returned to baseline and stable Postop Assessment: no apparent nausea or vomiting Anesthetic complications: no   No notable events documented.  Last Vitals:  Vitals:   02/07/24 0445 02/07/24 0821  BP: (!) 148/82 134/61  Pulse: 80 80  Resp: 18 18  Temp: 36.7 C 36.6 C  SpO2: 99% 95%    Last Pain:  Vitals:   02/06/24 2310  TempSrc:   PainSc: Asleep                 Alleyah Twombly L Thayer Embleton

## 2024-02-07 NOTE — Plan of Care (Signed)

## 2024-02-08 ENCOUNTER — Encounter (HOSPITAL_COMMUNITY): Payer: Self-pay | Admitting: Internal Medicine

## 2024-02-09 LAB — CULTURE, BLOOD (ROUTINE X 2)
Culture: NO GROWTH
Culture: NO GROWTH

## 2024-02-18 DIAGNOSIS — D4102 Neoplasm of uncertain behavior of left kidney: Secondary | ICD-10-CM | POA: Diagnosis not present

## 2024-02-25 ENCOUNTER — Encounter (INDEPENDENT_AMBULATORY_CARE_PROVIDER_SITE_OTHER): Payer: Self-pay | Admitting: Gastroenterology

## 2024-02-25 ENCOUNTER — Ambulatory Visit (INDEPENDENT_AMBULATORY_CARE_PROVIDER_SITE_OTHER): Admitting: Gastroenterology

## 2024-02-25 VITALS — BP 145/73 | HR 77 | Temp 98.2°F | Ht 61.0 in | Wt 132.5 lb

## 2024-02-25 DIAGNOSIS — K8050 Calculus of bile duct without cholangitis or cholecystitis without obstruction: Secondary | ICD-10-CM | POA: Diagnosis not present

## 2024-02-25 DIAGNOSIS — R7989 Other specified abnormal findings of blood chemistry: Secondary | ICD-10-CM

## 2024-02-25 DIAGNOSIS — K50018 Crohn's disease of small intestine with other complication: Secondary | ICD-10-CM | POA: Diagnosis not present

## 2024-02-25 NOTE — Progress Notes (Unsigned)
 Referring Provider: Shona Norleen PEDLAR, MD Primary Care Physician:  Eartha Flavors, Toribio, MD Primary GI Physician: Dr. Eartha   Chief Complaint  Patient presents with   Follow-up    Pt arrives for follow up. Pt was in [facility] from 02/04/24-02/07/24 due to abdominal pain. Pt currently not having any abdominal pain. Pt does not have gallbladder but has 12 gallbladder stones. No vomiting.     HPI:   Annette Briggs is a 83 y.o. 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 , chronic GERD   Patient presenting today for:  hospital follow up of choledocolithiasis Follow up of ileocolonic chron's disease   Recent admission on 02/05/24 for abdominal pain, found to have elevated LFTs and  MRCP with choledocolithasis   Underwent ERCP on 02/06/24 with extraction of multiple stones  Last CMP on 8/7 with AST 88, ALT 199, ALk phos 175 3.3   In regards to her Crohn's disease, last calprotectin in feb was 298, stelara  levels were low in may w/o antibodies, advised to increase stelara  every 6 weeks  Was transitioned to Wezlana  every 6 weeks in July   Present   Patient states she had upper abdominal pain, seen in the ED and discharged then started having vomiting, therefore she called in here and was recommended to proceed to the ED at [facility] where she had MRI that showed gallstones. She underwent ERCP on 8/6. She notes abdominal pain, nausea and vomiting have resolved. She had some diarrhea after her recent admission which seems to have subsided. She notes appetite seems to be improving as time goes on. Having a bit of constipation, though have a BM usually about everyday. No rectal bleeding or melena. She notes that she left one of her wezlana  doses out of the fridge overnight, she wonders if she needs to discard this dose. She has an extra dose on hand to take next Friday. She states that her statin was stopped while she was admitted and she was advised not to restart this.    Last  flu shot: 2020, not interested in having more Last pneumonia shot:possibly had it 15-20 years ago, had allergic reaction to it Last evaluation by dermatology: within the last year, had Mohs surgery in 08/2023 [facility] Last zoster vaccine: never COVID-19 shot: had 1 dose only  MR enterograpy: 09/07/23 Similar appearance of short segment wall thickening of the terminal ileum, involving a segment no greater than 5 cm in length. On today's examination there is minimal if any mucosal hyperenhancement, and there is no evidence of overt stricture, obstruction, fistula, or abscess. Findings remain consistent with stigmata of Crohn's ileitis, without evidence of significant active inflammation or complication at this time. 2. No other inflammatory findings of the bowel. 3. Descending and sigmoid diverticulosis without evidence of acute diverticulitis. 4. Status post hysterectomy.  Pelvic floor prolapse with cystocele. 5. Status post cholecystectomy. Last Colonoscopy: 01/15/2019, purulent discharge from diverticulosis consistent with diverticulitis, otherwise there were no other inflammatory changes in the examined colon.  CT enterography performed on 06/14/2020.  She had presence of hyperenhancement and newly stratification of the terminal ileum consistent with active Crohn's disease.  Some loops were slightly distended in the small bowel suggestive of adhesions.   Filed Weights   02/25/24 0818  Weight: 132 lb 8 oz (60.1 kg)     Past Medical History:  Diagnosis Date   Anxiety    Crohn's colitis (HCC)    Depression    Essential hypertension  GERD (gastroesophageal reflux disease)     Past Surgical History:  Procedure Laterality Date   APPENDECTOMY     BLADDER SUSPENSION     CHOLECYSTECTOMY     COLONOSCOPY     2008   COLONOSCOPY N/A 02/13/2013   Procedure: COLONOSCOPY;  Surgeon: Claudis RAYMOND Rivet, MD;  [facility];  Service: Endoscopy;  Laterality: N/A;  730   COLONOSCOPY N/A  01/15/2019   Procedure: COLONOSCOPY;  Surgeon: Rivet Claudis RAYMOND, MD;  Location: [facility];  Service: Endoscopy;  Laterality: N/A;  100   ERCP N/A 02/06/2024   Procedure: ERCP, WITH INTERVENTION IF INDICATED;  Surgeon: Abran Norleen SAILOR, MD;  Location: [facility] ENDOSCOPY;  Service: Gastroenterology;  Laterality: N/A;   ESOPHAGOGASTRODUODENOSCOPY N/A 03/18/2020   Procedure: ESOPHAGOGASTRODUODENOSCOPY (EGD);  Surgeon: Rivet Claudis RAYMOND, MD;  Location: [facility];  Service: Endoscopy;  Laterality: N/A;  225, moved up per office   LEFT HEART CATH AND CORONARY ANGIOGRAPHY N/A 02/02/2020   Procedure: LEFT HEART CATH AND CORONARY ANGIOGRAPHY;  Surgeon: Wonda Sharper, MD;  Location: [facility] INVASIVE CV LAB;  Service: Cardiovascular;  Laterality: N/A;   TOTAL ABDOMINAL HYSTERECTOMY      Current Outpatient Medications  Medication Sig Dispense Refill   alum & mag hydroxide-simeth (MAALOX/MYLANTA) 200-200-20 MG/5ML suspension Take 15 mLs by mouth every 6 (six) hours as needed for indigestion, heartburn or flatulence. 355 mL 0   Cholecalciferol (DIALYVITE VITAMIN D  5000) 125 MCG (5000 UT) capsule Take 5,000 Units by mouth daily.     Loperamide HCl (IMODIUM PO) Take 2-4 mg by mouth every 4 (four) hours as needed (Diarrhea).     loratadine  (CLARITIN ) 10 MG tablet Take 10 mg by mouth daily.     metoprolol  succinate (TOPROL -XL) 25 MG 24 hr tablet Take 25 mg by mouth at bedtime.     Multiple Vitamin (MULTIVITAMIN) tablet Take 1 tablet by mouth daily.     ondansetron  (ZOFRAN ) 4 MG tablet Take 1 tablet (4 mg total) by mouth every 6 (six) hours as needed for nausea. 20 tablet 0   pantoprazole  (PROTONIX ) 40 MG tablet Take 1 tablet (40 mg total) by mouth daily. 30 tablet 1   psyllium (METAMUCIL SMOOTH TEXTURE) 58.6 % powder Take 1 packet by mouth at bedtime.     traMADol  (ULTRAM ) 50 MG tablet Take 1 tablet (50 mg total) by mouth every 6 (six) hours as needed for severe pain (pain score 7-10). 14 tablet 0   Ustekinumab -auub  (WEZLANA ) 90 MG/ML SOSY Inject 90 mg into the skin every 6 (six) weeks. 1 mL 9   venlafaxine  XR (EFFEXOR -XR) 37.5 MG 24 hr capsule Take 37.5 mg by mouth daily.     No current facility-administered medications for this visit.    Allergies as of 02/25/2024 - Review Complete 02/25/2024  Allergen Reaction Noted   Ciprofloxacin Other (See Comments) 02/05/2013   Pneumovax [pneumococcal polysaccharide vaccine] Swelling 02/07/2013   Wellbutrin [bupropion] Anxiety 02/05/2013    Social History   Socioeconomic History   Marital status: Widowed    Spouse name: Not on file   Number of children: Not on file   Years of education: Not on file   Highest education level: Not on file  Occupational History   Not on file  Tobacco Use   Smoking status: Never    Passive exposure: Never   Smokeless tobacco: Never  Vaping Use   Vaping status: Never Used  Substance and Sexual Activity   Alcohol use: No  Alcohol/week: 0.0 standard drinks of alcohol   Drug use: No   Sexual activity: Not on file  Other Topics Concern   Not on file  Social History Narrative   Not on file   Social Drivers of Health   Financial Resource Strain: Not on file  Food Insecurity: No Food Insecurity (02/05/2024)   Hunger Vital Sign    Worried About Running Out of Food in the Last Year: Never true    Ran Out of Food in the Last Year: Never true  Transportation Needs: No Transportation Needs (02/05/2024)   PRAPARE - Administrator, Civil Service (Medical): No    Lack of Transportation (Non-Medical): No  Physical Activity: Not on file  Stress: Not on file  Social Connections: Unknown (02/05/2024)   Social Connection and Isolation Panel    Frequency of Communication with Friends and Family: More than three times a week    Frequency of Social Gatherings with Friends and Family: More than three times a week    Attends Religious Services: More than 4 times per year    Active Member of [employer] or Organizations: Yes     Attends Engineer, structural: More than 4 times per year    Marital Status: Patient declined    Review of systems General: negative for malaise, night sweats, fever, chills, weight loss Neck: Negative for lumps, goiter, pain and significant neck swelling Resp: Negative for cough, wheezing, dyspnea at rest CV: Negative for chest pain, leg swelling, palpitations, orthopnea GI: denies melena, hematochezia, nausea, vomiting, diarrhea, constipation, dysphagia, odyonophagia, early satiety or unintentional weight loss.  MSK: Negative for joint pain or swelling, back pain, and muscle pain. Derm: Negative for itching or rash Psych: Denies depression, anxiety, memory loss, confusion. No homicidal or suicidal ideation.  Heme: Negative for prolonged bleeding, bruising easily, and swollen nodes. Endocrine: Negative for cold or heat intolerance, polyuria, polydipsia and goiter. Neuro: negative for tremor, gait imbalance, syncope and seizures. The remainder of the review of systems is noncontributory.  Physical Exam: BP (!) 145/73   Pulse 77   Temp 98.2 F (36.8 C)   Ht 5' 1 (1.549 m)   Wt 132 lb 8 oz (60.1 kg)   BMI 25.04 kg/m  General:   Alert and oriented. No distress noted. Pleasant and cooperative.  Head:  Normocephalic and atraumatic. Eyes:  Conjuctiva clear without scleral icterus. Mouth:  Oral mucosa pink and moist. Good dentition. No lesions. Heart: Normal rate and rhythm, s1 and s2 heart sounds present.  Lungs: Clear lung sounds in all lobes. Respirations equal and unlabored. Abdomen:  +BS, soft, non-tender and non-distended. No rebound or guarding. No HSM or masses noted. Derm: No palmar erythema or jaundice Msk:  Symmetrical without gross deformities. Normal posture. Extremities:  Without edema. Neurologic:  Alert and  oriented x4 Psych:  Alert and cooperative. Normal mood and affect.  Invalid input(s): 6 MONTHS   ASSESSMENT: Annette Briggs is a 83 y.o. female  presenting today for follow up of choledocolithiasis and ileal Crohn's disease   Recent admission for abdominal pain, nausea and vomiting, elevated LFTs with MRCP  showing Intra and extrahepatic biliary duct dilatation with multiple tiny layering stones in the distal common bile duct. She underwent ERCP on  8/6 with sphincterotomy and extraction of multiple biliary CBD stones. Patient is feeling much better. Notably she is s/p cholecystectomy in the past. Last LFTs on 8/7 with AST 88, ALT 199, alk phos 111 and T bili  1.8. will recheck LFTs to ensure continued improvement. May benefit from starting Urso to prevent further stones, will discuss this with Dr. Eartha.   Ileal Crohn's disease: maintained on Wezlana  since July( previously on Stelara ). Seems to be doing well on this. Did note some diarrhea after her recent admission but this has resolved. No abdominal pain or rectal bleeding. . Her calprotectin on 8/4 was 179, though improved from February at 298. CRP was elevated to 14 and sed rate 26 during recent admission though possibly secondary to acute illness/choledocolithiasis as above. MR enterography in march  consistent with stigmata of Crohn's ileitis, without evidence of significant active inflammation or complication at this time.Recent stelara  levels in April were 3 without antibodies, she was recommended to increase dosing to every 6 weeks. As her calprotectin has improved and she feels symptoms are well controlled, will plan to recheck inflammatory markers at her follow up visit and continue with wezlana  every 6 weeks at this time. Will need to recheck ustekinumab  levels and antibodies again around early January after 5 doses on her new interval schedule.    PLAN:  -repeat CMP -continue wezlana  every 6 weeks -consider Urso, will discuss with Dr. Eartha -recheck ustekinumab  levels/Ab early January  -calprotectin and CRP at follow up visit  All questions were answered, patient verbalized  understanding and is in agreement with plan as outlined above.   Follow Up: 3 months   Donyel Nester L. Erling Arrazola, MSN, APRN, AGNP-C Adult-Gerontology Nurse Practitioner [facility]  I have reviewed the note and agree with the APP's assessment as described in this progress note  Given history of secondary biliary stones, I will recommend starting the patient on ursodiol 300 mg twice a day to prevent recurrent biliary stones.  Toribio Eartha, MD Gastroenterology and Hepatology [facility]

## 2024-02-25 NOTE — Patient Instructions (Addendum)
 We will recheck liver enzymes and I will be in touch I will discuss with Dr. Eartha the medication to help prevent further gallstones Please continue your wezlana  every 6 weeks   Follow up 3 months  It was a pleasure to see you today. I want to create trusting relationships with patients and provide genuine, compassionate, and quality care. I truly value your feedback! please be on the lookout for a survey regarding your visit with me today. I appreciate your input about our visit and your time in completing this!    Annette Briggs L. Nikcole Eischeid, MSN, APRN, AGNP-C Adult-Gerontology Nurse Practitioner [facility]

## 2024-02-26 LAB — COMPREHENSIVE METABOLIC PANEL WITH GFR
AG Ratio: 1.8 (calc) (ref 1.0–2.5)
ALT: 19 U/L (ref 6–29)
AST: 20 U/L (ref 10–35)
Albumin: 4.3 g/dL (ref 3.6–5.1)
Alkaline phosphatase (APISO): 111 U/L (ref 37–153)
BUN: 17 mg/dL (ref 7–25)
CO2: 31 mmol/L (ref 20–32)
Calcium: 9.4 mg/dL (ref 8.6–10.4)
Chloride: 102 mmol/L (ref 98–110)
Creat: 0.85 mg/dL (ref 0.60–0.95)
Globulin: 2.4 g/dL (ref 1.9–3.7)
Glucose, Bld: 99 mg/dL (ref 65–99)
Potassium: 4.3 mmol/L (ref 3.5–5.3)
Sodium: 140 mmol/L (ref 135–146)
Total Bilirubin: 0.5 mg/dL (ref 0.2–1.2)
Total Protein: 6.7 g/dL (ref 6.1–8.1)
eGFR: 67 mL/min/1.73m2 (ref >=60)

## 2024-02-28 ENCOUNTER — Ambulatory Visit (INDEPENDENT_AMBULATORY_CARE_PROVIDER_SITE_OTHER): Payer: Self-pay | Admitting: Gastroenterology

## 2024-02-28 DIAGNOSIS — R7989 Other specified abnormal findings of blood chemistry: Secondary | ICD-10-CM | POA: Insufficient documentation

## 2024-03-01 ENCOUNTER — Encounter (HOSPITAL_COMMUNITY): Payer: Self-pay

## 2024-03-01 ENCOUNTER — Emergency Department (HOSPITAL_COMMUNITY): Admission: EM | Admit: 2024-03-01 | Discharge: 2024-03-01 | Disposition: A | Discharge status: Home / Self Care

## 2024-03-01 ENCOUNTER — Other Ambulatory Visit: Payer: Self-pay

## 2024-03-01 ENCOUNTER — Emergency Department (HOSPITAL_COMMUNITY)

## 2024-03-01 DIAGNOSIS — R519 Headache, unspecified: Secondary | ICD-10-CM | POA: Diagnosis not present

## 2024-03-01 DIAGNOSIS — R1012 Left upper quadrant pain: Secondary | ICD-10-CM | POA: Diagnosis not present

## 2024-03-01 DIAGNOSIS — K429 Umbilical hernia without obstruction or gangrene: Secondary | ICD-10-CM | POA: Diagnosis not present

## 2024-03-01 DIAGNOSIS — K5730 Diverticulosis of large intestine without perforation or abscess without bleeding: Secondary | ICD-10-CM | POA: Diagnosis not present

## 2024-03-01 DIAGNOSIS — I10 Essential (primary) hypertension: Secondary | ICD-10-CM | POA: Diagnosis not present

## 2024-03-01 DIAGNOSIS — N289 Disorder of kidney and ureter, unspecified: Secondary | ICD-10-CM | POA: Insufficient documentation

## 2024-03-01 DIAGNOSIS — R109 Unspecified abdominal pain: Secondary | ICD-10-CM

## 2024-03-01 DIAGNOSIS — N2889 Other specified disorders of kidney and ureter: Secondary | ICD-10-CM | POA: Diagnosis not present

## 2024-03-01 LAB — URINALYSIS, ROUTINE W REFLEX MICROSCOPIC
Bacteria, UA: NONE SEEN
Bilirubin Urine: NEGATIVE
Glucose, UA: NEGATIVE mg/dL
Hgb urine dipstick: NEGATIVE
Ketones, ur: NEGATIVE mg/dL
Nitrite: NEGATIVE
Protein, ur: 30 mg/dL — AB
Specific Gravity, Urine: 1.025 (ref 1.005–1.030)
pH: 6 (ref 5.0–8.0)

## 2024-03-01 LAB — COMPREHENSIVE METABOLIC PANEL WITH GFR
ALT: 108 U/L — ABNORMAL HIGH (ref 0–44)
AST: 145 U/L — ABNORMAL HIGH (ref 15–41)
Albumin: 3.8 g/dL (ref 3.5–5.0)
Alkaline Phosphatase: 126 U/L (ref 38–126)
Anion gap: 10 (ref 5–15)
BUN: 20 mg/dL (ref 8–23)
CO2: 28 mmol/L (ref 22–32)
Calcium: 9 mg/dL (ref 8.9–10.3)
Chloride: 101 mmol/L (ref 98–111)
Creatinine, Ser: 0.84 mg/dL (ref 0.44–1.00)
GFR, Estimated: >60 mL/min (ref >60)
Glucose, Bld: 158 mg/dL — ABNORMAL HIGH (ref 70–99)
Potassium: 3.9 mmol/L (ref 3.5–5.1)
Sodium: 139 mmol/L (ref 135–145)
Total Bilirubin: 1 mg/dL (ref 0.0–1.2)
Total Protein: 6.7 g/dL (ref 6.5–8.1)

## 2024-03-01 LAB — TROPONIN I (HIGH SENSITIVITY)
Troponin I (High Sensitivity): 4 ng/L (ref <18)
Troponin I (High Sensitivity): 5 ng/L (ref <18)

## 2024-03-01 LAB — CBC
HCT: 41.4 % (ref 36.0–46.0)
Hemoglobin: 13.2 g/dL (ref 12.0–15.0)
MCH: 29.5 pg (ref 26.0–34.0)
MCHC: 31.9 g/dL (ref 30.0–36.0)
MCV: 92.6 fL (ref 80.0–100.0)
Platelets: 207 K/uL (ref 150–400)
RBC: 4.47 MIL/uL (ref 3.87–5.11)
RDW: 13.2 % (ref 11.5–15.5)
WBC: 8.5 K/uL (ref 4.0–10.5)
nRBC: 0 % (ref 0.0–0.2)

## 2024-03-01 LAB — LIPASE, BLOOD: Lipase: 68 U/L — ABNORMAL HIGH (ref 11–51)

## 2024-03-01 MED ORDER — FENTANYL CITRATE (PF) 100 MCG/2ML IJ SOLN
25.0000 ug | Freq: Once | INTRAMUSCULAR | Status: AC
  Administered 2024-03-01: 25 ug via INTRAVENOUS
  Filled 2024-03-01: qty 2

## 2024-03-01 MED ORDER — HYDROGEN PEROXIDE 3 % EX SOLN
CUTANEOUS | Status: AC
  Filled 2024-03-01: qty 473

## 2024-03-01 MED ORDER — IOHEXOL 300 MG/ML  SOLN
100.0000 mL | Freq: Once | INTRAMUSCULAR | Status: AC | PRN
  Administered 2024-03-01: 100 mL via INTRAVENOUS

## 2024-03-01 NOTE — ED Notes (Signed)
 Pt/family received d/c paperwork at this time. After going over the paperwork any questions, comments, or concerns were answered to the best of this nurse's knowledge. The pt/family verbally acknowledged the teachings/instructions.

## 2024-03-01 NOTE — ED Notes (Signed)
 Patient transported to CT

## 2024-03-01 NOTE — ED Provider Triage Note (Signed)
 Emergency Medicine Provider Triage Evaluation Note  Annette Briggs , a 83 y.o. female  was evaluated in triage.  Pt complains of abdominal pain, nausea, and diaphoresis.  Started just prior to arrival.  Reports that she was in the hospital for multiple gallstones.  She is status post cholecystectomy.  Denies any chest pain or shortness of breath.  Reports that she took tramadol  prior to arrival and pain is well-controlled at this time.  Review of Systems  Positive: Abdominal pain Negative: Chest pain  Physical Exam  Ht 5' 1 (1.549 m)   Wt 60.1 kg   BMI 25.03 kg/m  Gen:   Awake, no distress   Resp:  Normal effort  MSK:   Moves extremities without difficulty  Other:  Tender in right upper quadrant, epigastrium, left upper quadrant with no guarding or rebound  Medical Decision Making  Medically screening exam initiated at 3:51 PM.  Appropriate orders placed.  Annette Briggs was informed that the remainder of the evaluation will be completed by another provider, this initial triage assessment does not replace that evaluation, and the importance of remaining in the ED until their evaluation is complete.     Annette Prentice SAUNDERS, MD 03/01/24 [phone]

## 2024-03-01 NOTE — Discharge Instructions (Signed)
 Your CT scan was reassuring here today.  I called and discussed your case with Dr. Shaaron from GI.  Recommends that you take the tramadol  as needed for pain and follow-up with your doctor.  The CT scan did redemonstrate the lesion on your kidney.  Please follow-up with the urologist.  If they have not called you to schedule an appointment please call and schedule a follow-up appointment for them.  Please return to the emergency department or go to [facility] emergency department as we discussed for further evaluation if symptoms return or worsen.

## 2024-03-01 NOTE — ED Triage Notes (Signed)
 Pt c/o mid abdominal pain that radiates to her back. Pt states she was just at [facility] two weeks ago and had gallstones removed. Pt states just taking a 50 mg Tramadol  to see if it would work. Pt states she was sitting in a chair broke out in a sweat when it hit and became nauseous.

## 2024-03-01 NOTE — ED Provider Notes (Signed)
 [place] EMERGENCY DEPARTMENT AT [facility] Provider Note   CSN: [id number] Arrival date & time: 03/01/24  1408     Patient presents with: Abdominal Pain   Annette Briggs is a 83 y.o. female.   83 year old female with recent admission for ERCP for multiple gallstones presenting to the emergency department today with recurrent abdominal pain and nausea.  She reports that this started just prior to arrival.  She states that she has been doing well since leaving the hospital until today.  She reports a mild headache with this.  She reports this is not out of the ordinary for her to get headaches.  Denies any focal weakness, numbness, or tingling.  Denies any chest pain.  She reports normal bowel movements.   Abdominal Pain      Prior to Admission medications   Medication Sig Start Date End Date Taking? Authorizing Provider  alum & mag hydroxide-simeth (MAALOX/MYLANTA) 200-200-20 MG/5ML suspension Take 15 mLs by mouth every 6 (six) hours as needed for indigestion, heartburn or flatulence. 02/07/24   Cheryle Page, MD  Cholecalciferol (DIALYVITE VITAMIN D  5000) 125 MCG (5000 UT) capsule Take 5,000 Units by mouth daily.    [provider]  Loperamide HCl (IMODIUM PO) Take 2-4 mg by mouth every 4 (four) hours as needed (Diarrhea).    [provider]  loratadine  (CLARITIN ) 10 MG tablet Take 10 mg by mouth daily.    [provider]  metoprolol  succinate (TOPROL -XL) 25 MG 24 hr tablet Take 25 mg by mouth at bedtime.    [provider]  Multiple Vitamin (MULTIVITAMIN) tablet Take 1 tablet by mouth daily.    [provider]  ondansetron  (ZOFRAN ) 4 MG tablet Take 1 tablet (4 mg total) by mouth every 6 (six) hours as needed for nausea. 02/07/24   Cheryle Page, MD  pantoprazole  (PROTONIX ) 40 MG tablet Take 1 tablet (40 mg total) by mouth daily. 02/03/24 04/03/24  Cleotilde Rogue, MD  psyllium (METAMUCIL SMOOTH TEXTURE) 58.6 % powder Take 1 packet by  mouth at bedtime. 06/28/21   Golda Claudis PENNER, MD  traMADol  (ULTRAM ) 50 MG tablet Take 1 tablet (50 mg total) by mouth every 6 (six) hours as needed for severe pain (pain score 7-10). 02/07/24   Cheryle Page, MD  Ustekinumab -auub (WEZLANA ) 90 MG/ML SOSY Inject 90 mg into the skin every 6 (six) weeks. 12/31/23   Castaneda Mayorga, Daniel, MD  venlafaxine  XR (EFFEXOR -XR) 37.5 MG 24 hr capsule Take 37.5 mg by mouth daily.    [provider]    Allergies: Ciprofloxacin, Pneumovax [pneumococcal polysaccharide vaccine], and Wellbutrin [bupropion]    Review of Systems  Gastrointestinal:  Positive for abdominal pain.  Neurological:  Positive for headaches.    Updated Vital Signs BP (!) 154/64   Pulse 81   Temp 99.3 F (37.4 C) (Oral)   Resp 20   Ht 5' 1 (1.549 m)   Wt 60.1 kg   SpO2 93%   BMI 25.03 kg/m   Physical Exam Vitals and nursing note reviewed.   Gen: NAD Eyes: PERRL, EOMI HEENT: no oropharyngeal swelling Neck: trachea midline Resp: clear to auscultation bilaterally Card: RRR, no murmurs, rubs, or gallops Abd: The patient is tender over the left upper quadrant, epigastrium, and right upper quadrant with no guarding or rebound Extremities: no calf tenderness, no edema Vascular: 2+ radial pulses bilaterally, 2+ DP pulses bilaterally Neuro: No focal deficits Skin: no rashes Psyc: acting appropriately   (all labs ordered are  listed, but only abnormal results are displayed) Labs Reviewed  LIPASE, BLOOD - Abnormal; Notable for the following components:      Result Value   Lipase 68 (*)    All other components within normal limits  COMPREHENSIVE METABOLIC PANEL WITH GFR - Abnormal; Notable for the following components:   Glucose, Bld 158 (*)    AST 145 (*)    ALT 108 (*)    All other components within normal limits  URINALYSIS, ROUTINE W REFLEX MICROSCOPIC - Abnormal; Notable for the following components:   Protein, ur 30 (*)    Leukocytes,Ua TRACE (*)    All  other components within normal limits  CBC  TROPONIN I (HIGH SENSITIVITY)  TROPONIN I (HIGH SENSITIVITY)    EKG: EKG Interpretation Date/Time:  Saturday March 01 2024 16:04:01 EDT Ventricular Rate:  74 PR Interval:  222 QRS Duration:  88 QT Interval:  374 QTC Calculation: 415 R Axis:   -13  Text Interpretation: Sinus rhythm with 1st degree A-V block Minimal voltage criteria for LVH, may be normal variant ( R in aVL ) Borderline ECG When compared with ECG of 04-Feb-2024 15:46, PR interval has increased Nonspecific T wave abnormality now evident in Inferior leads T wave amplitude has decreased in Anterior leads T wave inversion no longer evident in Lateral leads Confirmed by Ula Barter [phone]) on 03/01/2024 4:05:31 PM  Radiology: CT ABDOMEN PELVIS W CONTRAST Result Date: 03/01/2024 CLINICAL DATA:  Abdominal pain, acute, nonlocalized. Mid abdominal pain radiating to back. EXAM: CT ABDOMEN AND PELVIS WITH CONTRAST TECHNIQUE: Multidetector CT imaging of the abdomen and pelvis was performed using the standard protocol following bolus administration of intravenous contrast. RADIATION DOSE REDUCTION: This exam was performed according to the departmental dose-optimization program which includes automated exposure control, adjustment of the mA and/or kV according to patient size and/or use of iterative reconstruction technique. CONTRAST:  OMNIPAQUE  IOHEXOL  300 MG/ML  SOLN COMPARISON:  02/03/2024, 02/04/2024. FINDINGS: Lower chest: Atelectasis is noted at the lung bases. There is a 3 mm nodule in the right middle lobe, axial image 5, unchanged. Hepatobiliary: No focal abnormality in the liver. Fatty infiltration of the liver is noted. There is intrahepatic and extrahepatic biliary ductal dilatation with pneumobilia. There is thickening of the walls of the distal common bile duct. The common bile duct measures 1.3 cm in diameter. The gallbladder is surgically absent. Pancreas: Unremarkable. No  pancreatic ductal dilatation or surrounding inflammatory changes. Spleen: Normal in size without focal abnormality. Adrenals/Urinary Tract: The adrenal glands are within normal limits. The kidneys enhance symmetrically. There is a complex lesion in the upper pole of the left kidney measuring 9 mm, previously characterized as concerning for renal cell carcinoma by MRI. No renal calculus or hydronephrosis bilaterally. The bladder is unremarkable. Stomach/Bowel: There is a small hiatal hernia. The stomach is otherwise within normal limits. No bowel obstruction, free air, or pneumatosis is seen. Scattered diverticular present along the colon without evidence of diverticulitis. A moderate amount of retained stool is present in the colon. The appendix is not seen. Vascular/Lymphatic: Aortic atherosclerosis. No enlarged abdominal or pelvic lymph nodes. Reproductive: Status post hysterectomy. No adnexal masses. Other: No abdominopelvic ascites. A fat containing umbilical hernia is present. Musculoskeletal: Degenerative changes are present in the thoracolumbar spine. No acute osseous abnormality is seen. IMPRESSION: 1. Status post cholecystectomy with intrahepatic and extrahepatic biliary ductal dilatation and pneumobilia. There is thickening of the walls of the distal common bile duct, which may be infectious or  inflammatory. 2. Complex lesion in the mid left kidney, previously characterized as concerning for renal cell carcinoma on recent MRI. 3. Hepatic steatosis. 4. Diverticulosis without diverticulitis. 5. Aortic atherosclerosis and coronary artery calcifications. Electronically Signed   By: Leita Birmingham M.D.   On: 03/01/2024 17:36     Procedures   Medications Ordered in the ED  hydrogen peroxide  3 % external solution (  Canceled Entry 03/01/24 1852)  fentaNYL  (SUBLIMAZE ) injection 25 mcg (25 mcg Intravenous Given 03/01/24 1636)  iohexol  (OMNIPAQUE ) 300 MG/ML solution 100 mL (100 mLs Intravenous Contrast Given  03/01/24 1639)                                    Medical Decision Making 83 year old female with recent ERCP for retained gallstones presenting to the emergency department today with abdominal pain.  I will further evaluate the patient here with basic labs including LFTs and lipase to evaluate for hepatobiliary pathology or pancreatitis.  Will obtain a CT scan to evaluate for persistent ductal dilation, pancreatitis, diverticulitis, colitis, or other abdominal pathology.  Will give the patient fentanyl  for headache.  Neurologic exam is reassuring.  EKG is not consistent with ACS.  I will reevaluate for ultimate disposition.  The patient's labs here are reassuring.  LFTs are mildly elevated.  Lipase is also mildly elevated but not consistent with pancreatitis.  CT scan shows dilated bile duct but this does seem to be less dilated than on her most recent CT scan.  I did call and discussed the patient's case with Dr. Shaaron.  We went over the imaging studies and labs as well as the patient's vital signs.  He recommends discharge with outpatient follow-up and to return to the emergency department or go to [facility] with recurrent or worsening symptoms but at this time does not think that repeat MRCP or further inpatient evaluation is warranted.  The patient is still asymptomatic on reassessment.  I discussed this with her.  She will be discharged with return precautions.  I am giving her the number for urology for the incidental finding on her kidney and this was discussed with the patient.  Amount and/or Complexity of Data Reviewed Labs: ordered. Radiology: ordered.  Risk Prescription drug management.        Final diagnoses:  Abdominal pain, unspecified abdominal location  Renal lesion    ED Discharge Orders     None          Ula Prentice SAUNDERS, MD 03/01/24 [phone]

## 2024-03-12 ENCOUNTER — Other Ambulatory Visit (INDEPENDENT_AMBULATORY_CARE_PROVIDER_SITE_OTHER): Payer: Self-pay | Admitting: Gastroenterology

## 2024-03-12 ENCOUNTER — Telehealth (INDEPENDENT_AMBULATORY_CARE_PROVIDER_SITE_OTHER): Payer: Self-pay | Admitting: Gastroenterology

## 2024-03-12 DIAGNOSIS — I10 Essential (primary) hypertension: Secondary | ICD-10-CM | POA: Diagnosis not present

## 2024-03-12 MED ORDER — URSODIOL 300 MG PO CAPS: 300.0000 mg | ORAL_CAPSULE | Freq: Two times a day (BID) | ORAL | 3 refills | Status: AC

## 2024-03-12 NOTE — Progress Notes (Signed)
Left message to return call. Will also send pt a my chart message

## 2024-03-12 NOTE — Telephone Encounter (Signed)
 Mariette Mitzie CROME, NP  Arthurine Merle, LPN Can we let the patient know that Dr. Eartha was in agreement to start the medication to help prevent development of further gallstones (Ursodiol ), I will send this in to her pharmacy.       Previous Messages    ----- Message ----- From: Eartha Angelia Sieving, MD Sent: 02/29/2024  12:27 PM EDT To: Mitzie CROME Mariette, NP  Given history of secondary biliary stones, I will recommend starting the patient on ursodiol  300 mg twice a day to prevent recurrent biliary stones.  Please send her this prescription. ----- Message ----- From: Mariette Mitzie CROME, NP Sent: 02/28/2024  12:06 PM EDT To: Sieving Eartha Angelia, MD    Left message to return call. Will send my chart message

## 2024-03-19 DIAGNOSIS — K5090 Crohn's disease, unspecified, without complications: Secondary | ICD-10-CM | POA: Diagnosis not present

## 2024-03-19 DIAGNOSIS — R079 Chest pain, unspecified: Secondary | ICD-10-CM | POA: Diagnosis not present

## 2024-03-19 DIAGNOSIS — K219 Gastro-esophageal reflux disease without esophagitis: Secondary | ICD-10-CM | POA: Diagnosis not present

## 2024-03-19 DIAGNOSIS — G43909 Migraine, unspecified, not intractable, without status migrainosus: Secondary | ICD-10-CM | POA: Diagnosis not present

## 2024-03-19 DIAGNOSIS — I10 Essential (primary) hypertension: Secondary | ICD-10-CM | POA: Diagnosis not present

## 2024-03-19 DIAGNOSIS — Z8669 Personal history of other diseases of the nervous system and sense organs: Secondary | ICD-10-CM | POA: Diagnosis not present

## 2024-03-19 DIAGNOSIS — Z79899 Other long term (current) drug therapy: Secondary | ICD-10-CM | POA: Diagnosis not present

## 2024-03-19 DIAGNOSIS — F331 Major depressive disorder, recurrent, moderate: Secondary | ICD-10-CM | POA: Diagnosis not present

## 2024-03-19 DIAGNOSIS — K449 Diaphragmatic hernia without obstruction or gangrene: Secondary | ICD-10-CM | POA: Diagnosis not present

## 2024-03-19 DIAGNOSIS — Z8719 Personal history of other diseases of the digestive system: Secondary | ICD-10-CM | POA: Diagnosis not present

## 2024-03-19 DIAGNOSIS — E782 Mixed hyperlipidemia: Secondary | ICD-10-CM | POA: Diagnosis not present

## 2024-03-19 DIAGNOSIS — R42 Dizziness and giddiness: Secondary | ICD-10-CM | POA: Diagnosis not present

## 2024-03-27 ENCOUNTER — Telehealth (INDEPENDENT_AMBULATORY_CARE_PROVIDER_SITE_OTHER): Payer: Self-pay

## 2024-03-27 NOTE — Telephone Encounter (Signed)
 Patient called today stating she is hesitant in starting on the the Ursodiol  as it causes side effects that she says she already has and she does not want to make her symptoms worse. She says she read it could cause issues with diarrhea, constipation, crohn's upset stomach, heart burn and gas. I advised that we would not give her a medication we thought would harm her. She says she is leery of taking it. She asked if she could return the medication to the pharmacy, as it cost $ 400.00 dollars. I advised that most pharmacies would not take it back and she asked if we could us  it and I advised that we also could not take medication from a patient and give to another. Please advise. Per noted the ursodiol  was prescribed to prevent gall stones.

## 2024-03-27 NOTE — Telephone Encounter (Signed)
 That is correct, the medication may prevent new episodes of stones in her bowel looks as she presented new stones despite not having a gallbladder.  If she does not want to take it, she is to understand there is a risk that this stones may come back and cause similar symptoms as she had before undergoing ERCP. we cannot accept the medication and it is unlikely that the pharmacy will receive it back.

## 2024-03-27 NOTE — Telephone Encounter (Signed)
 I called and left a detailed VM and advised per Dr. Eartha, the medication may prevent new episodes of stones in her bowel looks as she presented new stones despite not having a gallbladder. If she does not want to take it, she is to understand there is a risk that this stones may come back and cause similar symptoms as she had before undergoing ERCP. I asked that she please call the office back and let us  know she did receive this message.

## 2024-03-27 NOTE — Telephone Encounter (Signed)
 I called and left a Vm asked that the patient please return call to the office.

## 2024-03-31 NOTE — Telephone Encounter (Signed)
 Tried calling-no answer.

## 2024-04-01 DIAGNOSIS — M1711 Unilateral primary osteoarthritis, right knee: Secondary | ICD-10-CM | POA: Diagnosis not present

## 2024-04-07 ENCOUNTER — Telehealth (INDEPENDENT_AMBULATORY_CARE_PROVIDER_SITE_OTHER): Payer: Self-pay

## 2024-04-07 NOTE — Telephone Encounter (Signed)
 I spoke with the patient and made her aware per [facility], We can offer her the appt tomorrow, however, if she has recurrence of pain, any jaundice and/or fevers, she needs to proceed to the ED. Patient states understanding and will be here tomorrow for the appointment unless something happens where she will have to go to the ED.

## 2024-04-07 NOTE — Telephone Encounter (Signed)
 Patient had a episode of mid epigastric pain that radiated around her entire waist into her back yesterday. Patient has history of stones in bile duct, and history of hiatal hernia, gall bladder was removed years ago. Patient reports after eating nuts yesterday seh started having this pain,she says at the time she had chills and shaking, no sure if she had a fever. She says she is in no pain today, she denies any yellowing of skin or eyes, nausea,vomiting or issues with constipation or diarrhea. She reports she is having regular bm's. There is an opening tomorrow with [place]. Do we need to offer this appointment to her? And since no longer having the symptoms does she need evaluation at the Ed? Patient was supposed to have started on Ursodiol ,but refused to take it as she felt it would cause her more issues. Please advise.

## 2024-04-08 ENCOUNTER — Encounter (INDEPENDENT_AMBULATORY_CARE_PROVIDER_SITE_OTHER): Payer: Self-pay | Admitting: Gastroenterology

## 2024-04-08 ENCOUNTER — Ambulatory Visit (INDEPENDENT_AMBULATORY_CARE_PROVIDER_SITE_OTHER): Admitting: Gastroenterology

## 2024-04-08 VITALS — BP 131/68 | HR 85 | Temp 97.8°F | Ht 61.0 in | Wt 128.8 lb

## 2024-04-08 DIAGNOSIS — K219 Gastro-esophageal reflux disease without esophagitis: Secondary | ICD-10-CM

## 2024-04-08 DIAGNOSIS — Z9049 Acquired absence of other specified parts of digestive tract: Secondary | ICD-10-CM

## 2024-04-08 DIAGNOSIS — K8050 Calculus of bile duct without cholangitis or cholecystitis without obstruction: Secondary | ICD-10-CM

## 2024-04-08 DIAGNOSIS — R7989 Other specified abnormal findings of blood chemistry: Secondary | ICD-10-CM | POA: Diagnosis not present

## 2024-04-08 DIAGNOSIS — R1010 Upper abdominal pain, unspecified: Secondary | ICD-10-CM

## 2024-04-08 LAB — COMPREHENSIVE METABOLIC PANEL WITH GFR
AG Ratio: 1.9 (calc) (ref 1.0–2.5)
ALT: 232 U/L — ABNORMAL HIGH (ref 6–29)
AST: 100 U/L — ABNORMAL HIGH (ref 10–35)
Albumin: 4.3 g/dL (ref 3.6–5.1)
Alkaline phosphatase (APISO): 134 U/L (ref 37–153)
BUN: 16 mg/dL (ref 7–25)
CO2: 28 mmol/L (ref 20–32)
Calcium: 9.3 mg/dL (ref 8.6–10.4)
Chloride: 102 mmol/L (ref 98–110)
Creat: 0.86 mg/dL (ref 0.60–0.95)
Globulin: 2.3 g/dL (ref 1.9–3.7)
Glucose, Bld: 133 mg/dL (ref 65–139)
Potassium: 4.1 mmol/L (ref 3.5–5.3)
Sodium: 139 mmol/L (ref 135–146)
Total Bilirubin: 0.5 mg/dL (ref 0.2–1.2)
Total Protein: 6.6 g/dL (ref 6.1–8.1)
eGFR: 66 mL/min/1.73m2 (ref >=60)

## 2024-04-08 NOTE — Progress Notes (Signed)
 Referring Provider: Eartha Flavors, Madelaine* Primary Care Physician:  Shona Norleen PEDLAR, MD Primary GI Physician: Dr. Eartha   Chief Complaint  Patient presents with   Abdominal Pain    Patient here today due to having epigastric pain two days ago. Patient with history of stones in cbd. Patient was supposed to have started on Ursodiol , but was hesitant to start this. Patient denies any yellowing of eyes or skin. Patient says she has had several of these attacks in the last few weeks.  Patient says she was asked to stop her cholesterol medication and she has not resumed.     HPI:   Annette Briggs is a 83 y.o. 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 Having some constipation at last visit, abdominal pain, nausea, vomiting resolved  Recommended to repeat CMP, continue wezlana  every 6 weeks, start urso  300mg  BID, recehck ustekinumab  levels/ab in early January, calprotecin and CRP at scheduled follow up for Crohn's disease  On repeat CMP on 8/25 her LFTs were normal,   she had ED visit in August with  CMP on 8/30 with AST 145 ALT 108 Alk phos 126 T bili 1 (done by another provider), lipase at that time was 68  CT A/P with contrast on 8/30  Status post cholecystectomy with intrahepatic and extrahepatic biliary ductal dilatation and pneumobilia. There is thickening of the walls of the distal common bile duct, which may be infectious or inflammatory. 2. Complex lesion in the mid left kidney, previously characterized as concerning for renal cell carcinoma on recent MRI. 3. Hepatic steatosis. 4. Diverticulosis without diverticulitis.  Per EDP note, case discussed  with DR. Rourk, who felt MRCP not warranted at that time but recommended close follow up,  as patient was asymptomatic   Present:  States intermittent abdominal pain, notes an episode on 8/30 when she was seen in the ED. She has had a couple other episodes since then to include on yesterday. She notes she has tried some reflux medication which did not really help. She notes that Sunday night she had pain, she reports shivering, unsure if she had a fever, she endorses pain in epigastric region that radiates around to both sides, no associated nausea with the pain. She notes that when pain occurs it gradually improves over the course of a few hours. Denies any jaundice, scleral icterus or pruritus.   Notably she did see urology for lesion on the kidney seen previously on imaging.   She is currently now on protonix  40mg  daily. She notes that she has to avoid spicy foods still. She has taken mylanta on a few occasions which has not helped a ton, notes she takes pepcid  when she has a flare of symptoms. Occasionally will take 2 pepcid  per day.    Last flu shot: 2020, not interested in having more Last pneumonia shot:possibly had it 15-20 years ago, had allergic reaction to it Last evaluation by dermatology: within the last year, had Mohs surgery in 08/2023 for [facility] Last zoster vaccine: never COVID-19 shot: had 1 dose only   MR enterograpy: 09/07/23 Similar appearance of short segment wall thickening of the terminal ileum, involving a segment no greater than 5  cm in length. On today's examination there is minimal if any mucosal hyperenhancement, and there is no evidence of overt stricture, obstruction, fistula, or abscess. Findings remain consistent with stigmata of Crohn's ileitis, without evidence of significant active inflammation or complication at this time. 2. No other inflammatory findings of the bowel. 3. Descending and sigmoid diverticulosis without evidence of acute diverticulitis. 4. Status  post hysterectomy.  Pelvic floor prolapse with cystocele. 5. Status post cholecystectomy. Last Colonoscopy: 01/15/2019, purulent discharge from diverticulosis consistent with diverticulitis, otherwise there were no other inflammatory changes in the examined colon.  CT enterography performed on 06/14/2020.  She had presence of hyperenhancement and newly stratification of the terminal ileum consistent with active Crohn's disease.  Some loops were slightly distended in the small bowel suggestive of adhesions. Filed Weights   04/08/24 1151  Weight: 128 lb 12.8 oz (58.4 kg)     Past Medical History:  Diagnosis Date   Anxiety    Crohn's colitis (HCC)    Depression    Essential hypertension    GERD (gastroesophageal reflux disease)     Past Surgical History:  Procedure Laterality Date   APPENDECTOMY     BLADDER SUSPENSION     CHOLECYSTECTOMY     COLONOSCOPY     2008   COLONOSCOPY N/A 02/13/2013   Procedure: COLONOSCOPY;  Surgeon: Claudis RAYMOND Rivet, MD;  [facility];  Service: Endoscopy;  Laterality: N/A;  730   COLONOSCOPY N/A 01/15/2019   Procedure: COLONOSCOPY;  Surgeon: Rivet Claudis RAYMOND, MD;  Location: [facility];  Service: Endoscopy;  Laterality: N/A;  100   ERCP N/A 02/06/2024   Procedure: ERCP, WITH INTERVENTION IF INDICATED;  Surgeon: Abran Norleen SAILOR, MD;  Location: [facility] ENDOSCOPY;  Service: Gastroenterology;  Laterality: N/A;   ESOPHAGOGASTRODUODENOSCOPY N/A 03/18/2020   Procedure: ESOPHAGOGASTRODUODENOSCOPY (EGD);  Surgeon: Rivet Claudis RAYMOND, MD;  Location: [facility];  Service: Endoscopy;  Laterality: N/A;  225, moved up per office   LEFT HEART CATH AND CORONARY ANGIOGRAPHY N/A 02/02/2020   Procedure: LEFT HEART CATH AND CORONARY ANGIOGRAPHY;  Surgeon: Wonda Sharper, MD;  Location: [facility] INVASIVE CV LAB;  Service: Cardiovascular;  Laterality: N/A;   TOTAL ABDOMINAL HYSTERECTOMY      Current Outpatient Medications  Medication Sig Dispense Refill   alum & mag  hydroxide-simeth (MAALOX/MYLANTA) 200-200-20 MG/5ML suspension Take 15 mLs by mouth every 6 (six) hours as needed for indigestion, heartburn or flatulence. 355 mL 0   Cholecalciferol (DIALYVITE VITAMIN D  5000) 125 MCG (5000 UT) capsule Take 5,000 Units by mouth daily.     Loperamide HCl (IMODIUM PO) Take 2-4 mg by mouth every 4 (four) hours as needed (Diarrhea).     loratadine  (CLARITIN ) 10 MG tablet Take 10 mg by mouth daily.     metoprolol  succinate (TOPROL -XL) 25 MG 24 hr tablet Take 25 mg by mouth at bedtime.     Multiple Vitamin (MULTIVITAMIN) tablet Take 1 tablet by mouth daily.     ondansetron  (ZOFRAN ) 4 MG tablet Take 1 tablet (4 mg total) by mouth every 6 (six) hours as needed for nausea. 20 tablet 0   pantoprazole  (PROTONIX ) 40 MG tablet Take 1 tablet (40 mg total) by mouth daily. 30 tablet 1   psyllium (METAMUCIL SMOOTH TEXTURE) 58.6 % powder Take 1 packet by mouth at bedtime. (Patient taking differently: Take 1 packet by mouth at bedtime. Patient eats the fiber cookies when she remembers to take the fiber.)     traMADol  (ULTRAM ) 50 MG  tablet Take 1 tablet (50 mg total) by mouth every 6 (six) hours as needed for severe pain (pain score 7-10). 14 tablet 0   Ustekinumab -auub (WEZLANA ) 90 MG/ML SOSY Inject 90 mg into the skin every 6 (six) weeks. 1 mL 9   venlafaxine  XR (EFFEXOR -XR) 37.5 MG 24 hr capsule Take 37.5 mg by mouth daily.     ursodiol  (ACTIGALL ) 300 MG capsule Take 1 capsule (300 mg total) by mouth 2 (two) times daily. (Patient not taking: Reported on 04/08/2024) 180 capsule 3   No current facility-administered medications for this visit.    Allergies as of 04/08/2024 - Review Complete 04/08/2024  Allergen Reaction Noted   Ciprofloxacin Other (See Comments) 02/05/2013   Pneumovax [pneumococcal polysaccharide vaccine] Swelling 02/07/2013   Wellbutrin [bupropion] Anxiety 02/05/2013    Social History   Socioeconomic History   Marital status: Widowed    Spouse name: Not on  file   Number of children: Not on file   Years of education: Not on file   Highest education level: Not on file  Occupational History   Not on file  Tobacco Use   Smoking status: Never    Passive exposure: Never   Smokeless tobacco: Never  Vaping Use   Vaping status: Never Used  Substance and Sexual Activity   Alcohol use: No    Alcohol/week: 0.0 standard drinks of alcohol   Drug use: No   Sexual activity: Not on file  Other Topics Concern   Not on file  Social History Narrative   Not on file   Social Drivers of Health   Financial Resource Strain: Not on file  Food Insecurity: No Food Insecurity (02/05/2024)   Hunger Vital Sign    Worried About Running Out of Food in the Last Year: Never true    Ran Out of Food in the Last Year: Never true  Transportation Needs: No Transportation Needs (02/05/2024)   PRAPARE - Administrator, Civil Service (Medical): No    Lack of Transportation (Non-Medical): No  Physical Activity: Not on file  Stress: Not on file  Social Connections: Unknown (02/05/2024)   Social Connection and Isolation Panel    Frequency of Communication with Friends and Family: More than three times a week    Frequency of Social Gatherings with Friends and Family: More than three times a week    Attends Religious Services: More than 4 times per year    Active Member of [employer] or Organizations: Yes    Attends Engineer, structural: More than 4 times per year    Marital Status: Patient declined    Review of systems General: negative for malaise, night sweats, fever, chills, weight loss Neck: Negative for lumps, goiter, pain and significant neck swelling Resp: Negative for cough, wheezing, dyspnea at rest CV: Negative for chest pain, leg swelling, palpitations, orthopnea GI: denies melena, hematochezia, nausea, vomiting, diarrhea, constipation, dysphagia, odyonophagia, early satiety or unintentional weight loss. +upper abdominal pain  MSK: Negative for  joint pain or swelling, back pain, and muscle pain. Derm: Negative for itching or rash Psych: Denies depression, anxiety, memory loss, confusion. No homicidal or suicidal ideation.  Heme: Negative for prolonged bleeding, bruising easily, and swollen nodes. Endocrine: Negative for cold or heat intolerance, polyuria, polydipsia and goiter. Neuro: negative for tremor, gait imbalance, syncope and seizures. The remainder of the review of systems is noncontributory.  Physical Exam: BP 131/68 (BP Location: Left Arm, Patient Position: Sitting, Cuff Size: Normal)   Pulse  85   Temp 97.8 F (36.6 C) (Temporal)   Ht 5' 1 (1.549 m)   Wt 128 lb 12.8 oz (58.4 kg)   BMI 24.34 kg/m  General:   Alert and oriented. No distress noted. Pleasant and cooperative.  Head:  Normocephalic and atraumatic. Eyes:  Conjuctiva clear without scleral icterus. Mouth:  Oral mucosa pink and moist. Good dentition. No lesions. Heart: Normal rate and rhythm, s1 and s2 heart sounds present.  Lungs: Clear lung sounds in all lobes. Respirations equal and unlabored. Abdomen:  +BS, soft, non-tender and non-distended. No rebound or guarding. No HSM or masses noted. Derm: No palmar erythema or jaundice Msk:  Symmetrical without gross deformities. Normal posture. Extremities:  Without edema. Neurologic:  Alert and  oriented x4 Psych:  Alert and cooperative. Normal mood and affect.  Invalid input(s): 6 MONTHS   ASSESSMENT: Annette Briggs is a 83 y.o. female presenting today for upper abdominal pain with history of choledocolithiasis and GERD  Upper abdominal pain/history of choledocolithiasis:Recent admission for abdominal pain, nausea and vomiting, elevated LFTs with MRCP  showing Intra and extrahepatic biliary duct dilatation with multiple tiny layering stones in the distal common bile duct. She underwent ERCP on  8/6 with sphincterotomy and extraction of multiple biliary CBD stones. Notably she is s/p cholecystectomy in the  past. Was recommended at last visit to start Urso  but she did not as she was concerned about side effects.   She has had a few episodes of upper abdominal pain over the last 2 months, I suspect she may be passing some microlithiasis, especially as LFTs were elevated during recent ED visit, CT with some ongoing biliary dilation. She is pain free today, denies any jaundice pruritus or sclera icterus. Will recheck LFTs to ensure normalization, though if these remain elevated will get MRCP for further evaluation. I did discuss urso  again with her to include risks vs. Benefits, she will reconsider starting this.   GERD: recently changed from nexium  to protonix  40mg  daily after her ED visit. She is also taking pepcid  otc PRN, sometimes takes 2 per day as this helps to relieve GERD flares. I discussed switching her back to nexium  as she is having some breakthrough, however, she would like to try protonix  for a bit longer as she just got a refill, she can continue with pepcid  at bedtime, and take this daily if needed.    PLAN:  -repeat CMP -consider MRCP w wo contrast pending LFTs  -patient to consider starting Urso  300mg  BID  -continue protonix  40mg  daily and pepcid  20mg  daily for now -good reflux precautions -check inflammatory markers for Crohn's disease at follow up  All questions were answered, patient verbalized understanding and is in agreement with plan as outlined above.    Follow Up: 1-2 months   Rodrickus Min L. Mariette, MSN, APRN, AGNP-C Adult-Gerontology Nurse Practitioner [facility]  I have reviewed the note and agree with the APP's assessment as described in this progress note  We have followed very low threshold to proceed with MRCP if LFTs are elevated as patient had CT scan with biliary ductal dilation and history of secondary biliary stones.  If abnormal MRCP, she will need to be referred for ERCP.  Toribio Fortune, MD Gastroenterology and Hepatology [facility]

## 2024-04-08 NOTE — Patient Instructions (Addendum)
 We will recheck liver enzymes, if these are still elevated, we may need to get an MRI to take a further look at your bile ducts again as I suspect you may be passing some small stones here and there  Please consider the Urso  to help decrease production of gallstones, we can talk further about this when I touch base about your lab results  Follow up in 1-2 months  It was a pleasure to see you today. I want to create trusting relationships with patients and provide genuine, compassionate, and quality care. I truly value your feedback! please be on the lookout for a survey regarding your visit with me today. I appreciate your input about our visit and your time in completing this!    Raylea Adcox L. Joncarlo Friberg, MSN, APRN, AGNP-C Adult-Gerontology Nurse Practitioner [facility] at [facility]

## 2024-04-09 ENCOUNTER — Other Ambulatory Visit (INDEPENDENT_AMBULATORY_CARE_PROVIDER_SITE_OTHER): Payer: Self-pay

## 2024-04-09 ENCOUNTER — Ambulatory Visit (INDEPENDENT_AMBULATORY_CARE_PROVIDER_SITE_OTHER): Payer: Self-pay | Admitting: Gastroenterology

## 2024-04-09 DIAGNOSIS — R1010 Upper abdominal pain, unspecified: Secondary | ICD-10-CM

## 2024-04-09 DIAGNOSIS — R7989 Other specified abnormal findings of blood chemistry: Secondary | ICD-10-CM

## 2024-04-09 DIAGNOSIS — K8050 Calculus of bile duct without cholangitis or cholecystitis without obstruction: Secondary | ICD-10-CM

## 2024-04-09 NOTE — Telephone Encounter (Signed)
 ATC pt's daughter [name] to inform her of the pt's MRI appointment on 04/17/2024 at 8:45 am being NPO for 4 hours, no answer. LVM with MRI appt details along with my name and number.

## 2024-04-15 ENCOUNTER — Telehealth (INDEPENDENT_AMBULATORY_CARE_PROVIDER_SITE_OTHER): Payer: Self-pay | Admitting: Gastroenterology

## 2024-04-15 NOTE — Telephone Encounter (Signed)
 FYI-Pt left voicemail stating she is taking the pills that Dr.C wanted her to take. Has been taking them for a week.  Returned call to pt to get name of medication. Pt states she has started taking the Ursodiol . Began taking it after her appt with [facility] and is taking it daily.

## 2024-04-15 NOTE — Telephone Encounter (Signed)
 Noted, thank you Glenys!

## 2024-04-15 NOTE — Telephone Encounter (Signed)
 Thanks for the update

## 2024-04-16 ENCOUNTER — Encounter (INDEPENDENT_AMBULATORY_CARE_PROVIDER_SITE_OTHER): Payer: Self-pay | Admitting: Gastroenterology

## 2024-04-17 ENCOUNTER — Ambulatory Visit (HOSPITAL_COMMUNITY)
Admission: RE | Admit: 2024-04-17 | Discharge: 2024-04-17 | Disposition: A | Discharge status: Home / Self Care | Source: Ambulatory Visit | Attending: Gastroenterology | Admitting: Gastroenterology

## 2024-04-17 ENCOUNTER — Other Ambulatory Visit (INDEPENDENT_AMBULATORY_CARE_PROVIDER_SITE_OTHER): Payer: Self-pay | Admitting: Gastroenterology

## 2024-04-17 ENCOUNTER — Ambulatory Visit (INDEPENDENT_AMBULATORY_CARE_PROVIDER_SITE_OTHER): Payer: Self-pay | Admitting: Gastroenterology

## 2024-04-17 DIAGNOSIS — K8050 Calculus of bile duct without cholangitis or cholecystitis without obstruction: Secondary | ICD-10-CM | POA: Insufficient documentation

## 2024-04-17 DIAGNOSIS — R7989 Other specified abnormal findings of blood chemistry: Secondary | ICD-10-CM

## 2024-04-17 DIAGNOSIS — R1010 Upper abdominal pain, unspecified: Secondary | ICD-10-CM | POA: Insufficient documentation

## 2024-04-17 DIAGNOSIS — K838 Other specified diseases of biliary tract: Secondary | ICD-10-CM | POA: Diagnosis not present

## 2024-04-17 DIAGNOSIS — R932 Abnormal findings on diagnostic imaging of liver and biliary tract: Secondary | ICD-10-CM | POA: Diagnosis not present

## 2024-04-17 MED ORDER — GADOBUTROL 1 MMOL/ML IV SOLN
5.0000 mL | Freq: Once | INTRAVENOUS | Status: AC | PRN
  Administered 2024-04-17: 5 mL via INTRAVENOUS

## 2024-04-18 ENCOUNTER — Telehealth: Payer: Self-pay | Admitting: Internal Medicine

## 2024-04-18 ENCOUNTER — Telehealth (INDEPENDENT_AMBULATORY_CARE_PROVIDER_SITE_OTHER): Payer: Self-pay | Admitting: Gastroenterology

## 2024-04-18 NOTE — Progress Notes (Signed)
Referral placed as Urgent.

## 2024-04-18 NOTE — Telephone Encounter (Signed)
 Next available for me is 12/1 or 12/4. Happy to be available if it is soonest. I am hospital MD next week, so if she comes in, she will get cleaned out. Let me know if she is scheduled with me. GM

## 2024-04-18 NOTE — Telephone Encounter (Signed)
 Spoke with pt and she is feeling ok, no pain.She just had an MRI done that showed bile duct dilation and she has experienced some attacks with pain. Reports she plans to fly to [place]  to see her son that is sick and would like to plan for the ERCP in a few weeks. States if she gets to [place]  and starts having issues she will come back early.

## 2024-04-18 NOTE — Telephone Encounter (Signed)
 Linda,  Happy to do it when I am purple  RG

## 2024-04-18 NOTE — Telephone Encounter (Signed)
 NotedSABRA Rock Briggs GI patient that I saw as inpatient, covering biliary.  Performed ERCP with multiple stones extracted.  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 Please check on biliary doctors outpatient availabilities for ERCP with stone extraction. If she develops worsening pain, jaundice, or fever, she needs to proceed to the ER ASAP. Thanks, Dr. Abran

## 2024-04-18 NOTE — Telephone Encounter (Signed)
 Spoke with pt and she knows to proceed to the er if she has any of the complications listed below by Dr. Abran. She is aware we are trying to find her an appt and will call her back.

## 2024-04-18 NOTE — Telephone Encounter (Signed)
 Pt left voicemail yesterday at 4:36pm stating  [facility], this is Annette Briggs. Im worried now. Let me know if you have been able to get in touch with Dr.Perry. If I need to I can call and see if I can change my flight to [place] . Please let me know something. Thank you. Bye.  Copied and pasted Result Follow Up note and sent to pt in a my chart message.

## 2024-04-18 NOTE — Telephone Encounter (Signed)
 Rock, It is Friday afternoon and I am tied up until late doing procedures. This message is not helpful. Please find out the issue and get a sense for urgency. If the patient is at all not feeling well, then she should go to the hospital. Thanks, Dr. Abran

## 2024-04-18 NOTE — Telephone Encounter (Signed)
 Urgent referral in [place] for an ERCP.  Records are in EPIC.  Last ERCP was with Dr. Abran in 02/2024.  Please advise scheduling.  Thanks [employer]

## 2024-04-21 ENCOUNTER — Encounter (INDEPENDENT_AMBULATORY_CARE_PROVIDER_SITE_OTHER): Payer: Self-pay

## 2024-04-21 ENCOUNTER — Other Ambulatory Visit: Payer: Self-pay

## 2024-04-21 DIAGNOSIS — K8050 Calculus of bile duct without cholangitis or cholecystitis without obstruction: Secondary | ICD-10-CM

## 2024-04-21 NOTE — Telephone Encounter (Signed)
 Pt scheduled for ercp at [facility] 06/02/24 at 10:30am. Pt knows to arrive at [facility] at 9am and to be NPO after midnight. Case PI#[id number]. [facility] to go to [facility] if she has pain/jaundice prior to the ercp date.

## 2024-04-21 NOTE — Progress Notes (Signed)
 [facility] has been in touch with the patient and they are working on getting her scheduled

## 2024-04-21 NOTE — Telephone Encounter (Signed)
Pt read and replied to my chart message.

## 2024-04-21 NOTE — Telephone Encounter (Signed)
 Mychart message sent to patient.

## 2024-04-22 NOTE — Telephone Encounter (Signed)
 Noted

## 2024-05-01 ENCOUNTER — Encounter (INDEPENDENT_AMBULATORY_CARE_PROVIDER_SITE_OTHER): Payer: Self-pay | Admitting: Gastroenterology

## 2024-05-06 ENCOUNTER — Telehealth (INDEPENDENT_AMBULATORY_CARE_PROVIDER_SITE_OTHER): Payer: Self-pay

## 2024-05-06 NOTE — Telephone Encounter (Signed)
 I spoke with the patient and made her aware per [facility], Agree with the plan as outlined above, patient likely having transient choledocolithiasis. We have discussed in the past that if she has an acute flare, she needs to proceed to the ED at [facility] for evaluation and possible sooner ERCP as she is not scheduled until December. Should continue urso  for now. Patient states understanding.

## 2024-05-06 NOTE — Telephone Encounter (Signed)
 Patient called today stating she had several attacks of Upper abdomen/chest pain while in [place] for the last two weeks. Patient says she had this pain April 24, 2024, April 26, 2024 and again on May 03, 2024. She says she had chills, fatigue, and headaches. She reports she takes Tylenol  with this happens. Patient says she is fine currently and has no symptoms. Patient with history of passing gall stones, on ursodiol  and has upcoming appointment at [facility]  for a ERCP on June 02, 2024. Per [facility], when patient is having these pains she will need to proceed to [employer]. I spoke with the patient and made her aware if she has these pains again,fever,chills, jaundice, she will need to report to [facility] so they can do the ERCP sooner if needed, instead of waiting until June 02, 2024 scheduled ERCP. Patient states understanding.

## 2024-05-07 DIAGNOSIS — H43393 Other vitreous opacities, bilateral: Secondary | ICD-10-CM | POA: Diagnosis not present

## 2024-05-08 DIAGNOSIS — Z08 Encounter for follow-up examination after completed treatment for malignant neoplasm: Secondary | ICD-10-CM | POA: Diagnosis not present

## 2024-05-08 DIAGNOSIS — M1711 Unilateral primary osteoarthritis, right knee: Secondary | ICD-10-CM | POA: Diagnosis not present

## 2024-05-08 DIAGNOSIS — L570 Actinic keratosis: Secondary | ICD-10-CM | POA: Diagnosis not present

## 2024-05-08 DIAGNOSIS — D045 Carcinoma in situ of skin of trunk: Secondary | ICD-10-CM | POA: Diagnosis not present

## 2024-05-08 DIAGNOSIS — X32XXXD Exposure to sunlight, subsequent encounter: Secondary | ICD-10-CM | POA: Diagnosis not present

## 2024-05-08 DIAGNOSIS — Z85828 Personal history of other malignant neoplasm of skin: Secondary | ICD-10-CM | POA: Diagnosis not present

## 2024-05-08 DIAGNOSIS — C44629 Squamous cell carcinoma of skin of left upper limb, including shoulder: Secondary | ICD-10-CM | POA: Diagnosis not present

## 2024-05-09 ENCOUNTER — Observation Stay (HOSPITAL_COMMUNITY)
Admission: EM | Admit: 2024-05-09 | Discharge: 2024-05-10 | Disposition: A | Discharge status: Home / Self Care | Attending: Internal Medicine | Admitting: Internal Medicine

## 2024-05-09 ENCOUNTER — Encounter (HOSPITAL_COMMUNITY): Payer: Self-pay | Admitting: Internal Medicine

## 2024-05-09 DIAGNOSIS — K5090 Crohn's disease, unspecified, without complications: Secondary | ICD-10-CM | POA: Insufficient documentation

## 2024-05-09 DIAGNOSIS — K8050 Calculus of bile duct without cholangitis or cholecystitis without obstruction: Secondary | ICD-10-CM | POA: Diagnosis not present

## 2024-05-09 DIAGNOSIS — K5900 Constipation, unspecified: Secondary | ICD-10-CM

## 2024-05-09 DIAGNOSIS — Z9049 Acquired absence of other specified parts of digestive tract: Secondary | ICD-10-CM | POA: Diagnosis not present

## 2024-05-09 DIAGNOSIS — K838 Other specified diseases of biliary tract: Secondary | ICD-10-CM | POA: Diagnosis not present

## 2024-05-09 DIAGNOSIS — R1013 Epigastric pain: Secondary | ICD-10-CM | POA: Diagnosis present

## 2024-05-09 DIAGNOSIS — I10 Essential (primary) hypertension: Secondary | ICD-10-CM | POA: Insufficient documentation

## 2024-05-09 DIAGNOSIS — R945 Abnormal results of liver function studies: Secondary | ICD-10-CM | POA: Diagnosis not present

## 2024-05-09 DIAGNOSIS — N289 Disorder of kidney and ureter, unspecified: Secondary | ICD-10-CM

## 2024-05-09 DIAGNOSIS — Z9889 Other specified postprocedural states: Secondary | ICD-10-CM | POA: Diagnosis not present

## 2024-05-09 HISTORY — DX: Crohn's disease of both small and large intestine without complications: K50.80

## 2024-05-09 HISTORY — DX: Calculus of bile duct without cholangitis or cholecystitis without obstruction: K80.50

## 2024-05-09 LAB — CBC
HCT: 42.8 % (ref 36.0–46.0)
Hemoglobin: 13.5 g/dL (ref 12.0–15.0)
MCH: 28 pg (ref 26.0–34.0)
MCHC: 31.5 g/dL (ref 30.0–36.0)
MCV: 88.6 fL (ref 80.0–100.0)
Platelets: 255 K/uL (ref 150–400)
RBC: 4.83 MIL/uL (ref 3.87–5.11)
RDW: 12.9 % (ref 11.5–15.5)
WBC: 8 K/uL (ref 4.0–10.5)
nRBC: 0 % (ref 0.0–0.2)

## 2024-05-09 LAB — COMPREHENSIVE METABOLIC PANEL WITH GFR
ALT: 47 U/L — ABNORMAL HIGH (ref 0–44)
AST: 38 U/L (ref 15–41)
Albumin: 4.2 g/dL (ref 3.5–5.0)
Alkaline Phosphatase: 150 U/L — ABNORMAL HIGH (ref 38–126)
Anion gap: 10 (ref 5–15)
BUN: 17 mg/dL (ref 8–23)
CO2: 29 mmol/L (ref 22–32)
Calcium: 9.9 mg/dL (ref 8.9–10.3)
Chloride: 103 mmol/L (ref 98–111)
Creatinine, Ser: 0.83 mg/dL (ref 0.44–1.00)
GFR, Estimated: >60 mL/min (ref >60)
Glucose, Bld: 93 mg/dL (ref 70–99)
Potassium: 4.3 mmol/L (ref 3.5–5.1)
Sodium: 142 mmol/L (ref 135–145)
Total Bilirubin: 0.3 mg/dL (ref 0.0–1.2)
Total Protein: 7.4 g/dL (ref 6.5–8.1)

## 2024-05-09 LAB — LIPASE, BLOOD: Lipase: 82 U/L — ABNORMAL HIGH (ref 11–51)

## 2024-05-09 MED ORDER — LACTATED RINGERS IV SOLN: INTRAVENOUS | Status: DC

## 2024-05-09 MED ORDER — ONDANSETRON HCL 4 MG PO TABS: 4.0000 mg | ORAL_TABLET | Freq: Four times a day (QID) | ORAL | Status: DC | PRN

## 2024-05-09 MED ORDER — ACETAMINOPHEN 325 MG PO TABS
650.0000 mg | ORAL_TABLET | Freq: Four times a day (QID) | ORAL | Status: DC | PRN
  Administered 2024-05-10: 650 mg via ORAL
  Filled 2024-05-09: qty 2

## 2024-05-09 MED ORDER — ALBUTEROL SULFATE (2.5 MG/3ML) 0.083% IN NEBU
2.5000 mg | INHALATION_SOLUTION | RESPIRATORY_TRACT | Status: DC | PRN
Start: 2024-05-09 — End: 2024-05-10

## 2024-05-09 MED ORDER — HYDROMORPHONE HCL 1 MG/ML IJ SOLN: 0.5000 mg | INTRAMUSCULAR | Status: DC | PRN

## 2024-05-09 MED ORDER — TRAZODONE HCL 50 MG PO TABS: 25.0000 mg | ORAL_TABLET | Freq: Every evening | ORAL | Status: DC | PRN

## 2024-05-09 MED ORDER — ALUM & MAG HYDROXIDE-SIMETH 200-200-20 MG/5ML PO SUSP: 30.0000 mL | ORAL | Status: DC | PRN

## 2024-05-09 MED ORDER — ONDANSETRON HCL 4 MG/2ML IJ SOLN
4.0000 mg | Freq: Four times a day (QID) | INTRAMUSCULAR | Status: DC | PRN
Start: 2024-05-09 — End: 2024-05-10

## 2024-05-09 MED ORDER — POLYVINYL ALCOHOL 1.4 % OP SOLN
1.0000 [drp] | OPHTHALMIC | Status: DC | PRN
  Filled 2024-05-09: qty 15

## 2024-05-09 MED ORDER — ACETAMINOPHEN 650 MG RE SUPP: 650.0000 mg | Freq: Four times a day (QID) | RECTAL | Status: DC | PRN

## 2024-05-09 MED ORDER — SODIUM CHLORIDE 0.9 % IV SOLN
1.5000 g | INTRAVENOUS | Status: AC
  Administered 2024-05-10: 1.5 g via INTRAVENOUS
  Filled 2024-05-09: qty 4

## 2024-05-09 MED ORDER — OXYCODONE HCL 5 MG PO TABS: 5.0000 mg | ORAL_TABLET | ORAL | Status: DC | PRN

## 2024-05-09 NOTE — Consult Note (Addendum)
 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  The risks and benefits as well as alternatives of endoscopic procedure(s) have been discussed and reviewed. All questions answered. The patient agrees to proceed. She understands there are risks not limited to but including of bleeding infection reactions to medications for anesthesia, injury requiring surgery and pancreatitis  She can have full liquids tonight and then n.p.o. after midnight.  MiraLAX daily for bowel habit issues  Needs urology evaluation as outpatient for left kidney lesion concerning for malignancy  This is high complexity medical decision making in a patient requiring endoscopic removal of symptomatic common bile duct stones.  Lupita CHARLENA Commander, MD, [facility] [place] [facility] See AMION on call - gastroenterology for best contact person 05/09/2024 6:34 PM          HPI:   Annette Briggs is a 83 y.o. female with a history of Crohn's ileocolitis and choledocholithiasis status post ERCP by Dr. Abran on 02/06/2024, with removal of 10 to 12 stones up to 15 mm in diameter.  She started to have recurrent intermittent epigastric pain and low chest  pain that radiates into the back.  She ended up having an MRCP on April 17, 2024 that demonstrated choledocholithiasis.  We were contacted and she was scheduled for outpatient ERCP December 1.  She has had recurrent episodes and had another episode today described as severe.  Sometimes she gets shakes or chills.  She has not been jaundiced she has not had nausea or vomiting.  She feels well now that she is in the ED.  The spells last for an hour or so at times.  She is being admitted to treat this problem.  Other issues is that she has developed hard ball-like stools that are difficult to expel from the rectum, after switching from Stelara  to Wezlana .  She used to have diarrhea.  She did start Urso  deoxycholic acid and is taking that 300 mg twice daily, September 10.  GI review of systems otherwise negative.   MRCP 04/17/2024-images reviewed personally  MPRESSION: 1. Redemonstration of choledocholithiasis with mild intra and extrahepatic bile duct dilation. No obstructing mass. 2. Redemonstration of heterogeneous, enhancing 1.0 x 1.2 cm lesion arising from the left kidney upper pole, laterally, concerning for renal cell carcinoma. No significant interval change. 3. Multiple other nonacute observations, as described above.  Past Medical History:  Diagnosis Date   Anxiety    Choledocholithiasis    Crohn's ileocolitis (HCC)    Depression    Essential hypertension    GERD (gastroesophageal reflux disease)     Past Surgical History:  Procedure Laterality Date   APPENDECTOMY     BLADDER SUSPENSION     CHOLECYSTECTOMY     COLONOSCOPY     2008   COLONOSCOPY N/A 02/13/2013   Procedure: COLONOSCOPY;  Surgeon: Claudis  RAYMOND Rivet, MD;  [facility];  Service: Endoscopy;  Laterality: N/A;  730   COLONOSCOPY N/A 01/15/2019   Procedure: COLONOSCOPY;  Surgeon: Rivet Claudis RAYMOND, MD;  Location: [facility];  Service: Endoscopy;  Laterality: N/A;  100   ERCP N/A 02/06/2024   Procedure:  ERCP, WITH INTERVENTION IF INDICATED;  Surgeon: Abran Norleen SAILOR, MD;  Location: [facility] ENDOSCOPY;  Service: Gastroenterology;  Laterality: N/A;   ESOPHAGOGASTRODUODENOSCOPY N/A 03/18/2020   Procedure: ESOPHAGOGASTRODUODENOSCOPY (EGD);  Surgeon: Rivet Claudis RAYMOND, MD;  Location: [facility];  Service: Endoscopy;  Laterality: N/A;  225, moved up per office   LEFT HEART CATH AND CORONARY ANGIOGRAPHY N/A 02/02/2020   Procedure: LEFT HEART CATH AND CORONARY ANGIOGRAPHY;  Surgeon: Wonda Sharper, MD;  Location: [facility] INVASIVE CV LAB;  Service: Cardiovascular;  Laterality: N/A;   TOTAL ABDOMINAL HYSTERECTOMY      Family History  Problem Relation Age of Onset   Heart failure Mother    Kidney failure Father    Breast cancer Sister    Alzheimer's disease Sister      Social History   Tobacco Use   Smoking status: Never    Passive exposure: Never   Smokeless tobacco: Never  Vaping Use   Vaping status: Never Used  Substance Use Topics   Alcohol use: No    Alcohol/week: 0.0 standard drinks of alcohol   Drug use: No    Prior to Admission medications   Medication Sig Start Date End Date Taking? Authorizing Provider  alum & mag hydroxide-simeth (MAALOX/MYLANTA) 200-200-20 MG/5ML suspension Take 15 mLs by mouth every 6 (six) hours as needed for indigestion, heartburn or flatulence. 02/07/24   Cheryle Page, MD  Cholecalciferol (DIALYVITE VITAMIN D  5000) 125 MCG (5000 UT) capsule Take 5,000 Units by mouth daily.    [provider]  Loperamide HCl (IMODIUM PO) Take 2-4 mg by mouth every 4 (four) hours as needed (Diarrhea).    [provider]  loratadine  (CLARITIN ) 10 MG tablet Take 10 mg by mouth daily.    [provider]  metoprolol  succinate (TOPROL -XL) 25 MG 24 hr tablet Take 25 mg by mouth at bedtime.    [provider]  Multiple Vitamin (MULTIVITAMIN) tablet Take 1 tablet by mouth daily.    [provider]  ondansetron  (ZOFRAN ) 4 MG tablet Take 1 tablet (4 mg  total) by mouth every 6 (six) hours as needed for nausea. 02/07/24   Cheryle Page, MD  pantoprazole  (PROTONIX ) 40 MG tablet Take 1 tablet (40 mg total) by mouth daily. 02/03/24 04/08/24  Cleotilde Rogue, MD  psyllium (METAMUCIL SMOOTH TEXTURE) 58.6 % powder Take 1 packet by mouth at bedtime. Patient taking differently: Take 1 packet by mouth at bedtime. Patient eats the fiber cookies when she remembers to take the fiber. 06/28/21   Rivet Claudis RAYMOND, MD  traMADol  (ULTRAM ) 50 MG tablet Take 1 tablet (50 mg total) by mouth every 6 (six) hours as needed for severe pain (pain score 7-10). 02/07/24   Cheryle Page, MD  ursodiol  (ACTIGALL ) 300 MG capsule Take 1 capsule (300 mg total) by mouth 2 (two) times daily. Patient not taking: Reported on 04/08/2024 03/12/24   Carlan, Chelsea L, NP  Ustekinumab -auub (WEZLANA ) 90 MG/ML SOSY Inject 90 mg into the skin every 6 (six) weeks. 12/31/23   Castaneda Mayorga, Daniel, MD  venlafaxine  XR (EFFEXOR -XR) 37.5 MG 24 hr capsule Take 37.5 mg by mouth daily.    [provider]    Current  Facility-Administered Medications  Medication Dose Route Frequency Provider Last Rate Last Admin   acetaminophen  (TYLENOL ) tablet 650 mg  650 mg Oral Q6H PRN Zella, Mir M, MD       Or   acetaminophen  (TYLENOL ) suppository 650 mg  650 mg Rectal Q6H PRN Zella, Mir M, MD       albuterol (PROVENTIL) (2.5 MG/3ML) 0.083% nebulizer solution 2.5 mg  2.5 mg Nebulization Q2H PRN Zella, Mir M, MD       HYDROmorphone  (DILAUDID ) injection 0.5-1 mg  0.5-1 mg Intravenous Q2H PRN Zella, Mir M, MD       ondansetron  (ZOFRAN ) tablet 4 mg  4 mg Oral Q6H PRN Zella, Mir M, MD       Or   ondansetron  (ZOFRAN ) injection 4 mg  4 mg Intravenous Q6H PRN Zella, Mir M, MD       oxyCODONE (Oxy IR/ROXICODONE) immediate release tablet 5 mg  5 mg Oral Q4H PRN Zella, Mir M, MD       traZODone (DESYREL) tablet 25 mg  25 mg Oral QHS PRN Zella Katha HERO, MD        Allergies as of  05/09/2024 - Review Complete 05/09/2024  Allergen Reaction Noted   Ciprofloxacin Other (See Comments) 02/05/2013   Pneumovax [pneumococcal polysaccharide vaccine] Swelling 02/07/2013   Wellbutrin [bupropion] Anxiety 02/05/2013     Review of Systems:    This is positive for those things mentioned in the HPI. All other review of systems are negative.       Physical Exam:  Vital signs in last 24 hours: Temp:  [97.9 F (36.6 C)] 97.9 F (36.6 C) (11/07 1415) Pulse Rate:  [85] 85 (11/07 1415) Resp:  [20] 20 (11/07 1415) BP: (145)/(78) 145/78 (11/07 1415) SpO2:  [100 %] 100 % (11/07 1415)    General:  Well-developed, well-nourished and in no acute distress Eyes:  anicteric. ENT:   Mouth and posterior pharynx free of lesions.  Lungs: Clear to auscultation bilaterally. Heart:   S1S2, no rubs, murmurs, gallops. Abdomen:  soft, non-tender, no hepatosplenomegaly, hernia, or mass and BS+.  Neuro:  A&O x 3.  Psych:  appropriate mood and  Affect.   Data Reviewed:   LAB RESULTS: Recent Labs    05/09/24 1433  WBC 8.0  HGB 13.5  HCT 42.8  PLT 255   BMET Recent Labs    05/09/24 1433  NA 142  K 4.3  CL 103  CO2 29  GLUCOSE 93  BUN 17  CREATININE 0.83  CALCIUM 9.9   LFT Recent Labs    05/09/24 1433  PROT 7.4  ALBUMIN 4.2  AST 38  ALT 47*  ALKPHOS 150*  BILITOT 0.3   Lab Results  Component Value Date   LIPASE 82 (H) 05/09/2024       Thanks   LOS: 0 days   @Roddy Bellamy  CHARLENA Commander, MD, [facility] @  05/09/2024, 6:29 PM

## 2024-05-09 NOTE — ED Provider Notes (Signed)
 [place] EMERGENCY DEPARTMENT AT [facility] Provider Note   CSN: [id number] Arrival date & time: 05/09/24  1401     Patient presents with: Abdominal Pain   ZENOBIA KUENNEN is a 83 y.o. female.   Patient with history of hypertension, Crohn's, GERD presents today with complaints of abdominal pain.  Reports that she is status postcholecystectomy many years ago, however continues to have biliary stones.  She had an ERCP back in August to remove stones.  Reports that she has continued to have intermittent waves of pain, has another ERCP scheduled in December, however had reached out to her GI team who recommended if she had another epsiode of pain that she should come in for evaluation and to possibly have this procedure done inpatient. She reports that today she had an episode, pain in RUQ and epigastric region. Pain resolved about 1 hour prior to my evaluation and she has been asymptomatic since then. Denies any fevers or chills, no nausea, vomiting, or diarrhea.  The history is provided by the patient. No language interpreter was used.  Abdominal Pain      Prior to Admission medications   Medication Sig Start Date End Date Taking? Authorizing Provider  alum & mag hydroxide-simeth (MAALOX/MYLANTA) 200-200-20 MG/5ML suspension Take 15 mLs by mouth every 6 (six) hours as needed for indigestion, heartburn or flatulence. 02/07/24   Cheryle Page, MD  Cholecalciferol (DIALYVITE VITAMIN D  5000) 125 MCG (5000 UT) capsule Take 5,000 Units by mouth daily.    [provider]  Loperamide HCl (IMODIUM PO) Take 2-4 mg by mouth every 4 (four) hours as needed (Diarrhea).    [provider]  loratadine  (CLARITIN ) 10 MG tablet Take 10 mg by mouth daily.    [provider]  metoprolol  succinate (TOPROL -XL) 25 MG 24 hr tablet Take 25 mg by mouth at bedtime.    [provider]  Multiple Vitamin (MULTIVITAMIN) tablet Take 1 tablet by mouth daily.    [provider]  ondansetron  (ZOFRAN ) 4 MG tablet Take 1 tablet (4 mg total) by mouth every 6 (six) hours as needed for nausea. 02/07/24   Cheryle Page, MD  pantoprazole  (PROTONIX ) 40 MG tablet Take 1 tablet (40 mg total) by mouth daily. 02/03/24 04/08/24  Cleotilde Rogue, MD  psyllium (METAMUCIL SMOOTH TEXTURE) 58.6 % powder Take 1 packet by mouth at bedtime. Patient taking differently: Take 1 packet by mouth at bedtime. Patient eats the fiber cookies when she remembers to take the fiber. 06/28/21   Golda Claudis PENNER, MD  traMADol  (ULTRAM ) 50 MG tablet Take 1 tablet (50 mg total) by mouth every 6 (six) hours as needed for severe pain (pain score 7-10). 02/07/24   Cheryle Page, MD  ursodiol  (ACTIGALL ) 300 MG capsule Take 1 capsule (300 mg total) by mouth 2 (two) times daily. Patient not taking: Reported on 04/08/2024 03/12/24   Carlan, Chelsea L, NP  Ustekinumab -auub (WEZLANA ) 90 MG/ML SOSY Inject 90 mg into the skin every 6 (six) weeks. 12/31/23   Castaneda Mayorga, Daniel, MD  venlafaxine  XR (EFFEXOR -XR) 37.5 MG 24 hr capsule Take 37.5 mg by mouth daily.    [provider]    Allergies: Ciprofloxacin, Pneumovax [pneumococcal polysaccharide vaccine], and Wellbutrin [bupropion]    Review of Systems  Gastrointestinal:  Positive for abdominal pain (currently resolved).  All other systems reviewed and are negative.   Updated Vital Signs BP (!) 145/78 (BP Location: Left Arm)   Pulse 85   Temp 97.9 F (  36.6 C) (Oral)   Resp 20   SpO2 100%   Physical Exam Vitals and nursing note reviewed.  Constitutional:      General: She is not in acute distress.    Appearance: Normal appearance. She is normal weight. She is not ill-appearing, toxic-appearing or diaphoretic.  HENT:     Head: Normocephalic and atraumatic.  Cardiovascular:     Rate and Rhythm: Normal rate.  Pulmonary:     Effort: Pulmonary effort is normal. No respiratory distress.  Abdominal:     General: Abdomen is flat.      Palpations: Abdomen is soft.     Tenderness: There is no abdominal tenderness.  Musculoskeletal:        General: Normal range of motion.     Cervical back: Normal range of motion.  Skin:    General: Skin is warm and dry.  Neurological:     General: No focal deficit present.     Mental Status: She is alert.  Psychiatric:        Mood and Affect: Mood normal.        Behavior: Behavior normal.     (all labs ordered are listed, but only abnormal results are displayed) Labs Reviewed  LIPASE, BLOOD - Abnormal; Notable for the following components:      Result Value   Lipase 82 (*)    All other components within normal limits  COMPREHENSIVE METABOLIC PANEL WITH GFR - Abnormal; Notable for the following components:   ALT 47 (*)    Alkaline Phosphatase 150 (*)    All other components within normal limits  CBC  URINALYSIS, ROUTINE W REFLEX MICROSCOPIC    EKG: None  Radiology: No results found.   Procedures   Medications Ordered in the ED - No data to display                                  Medical Decision Making Amount and/or Complexity of Data Reviewed Labs: ordered.   This patient is a 83 y.o. female who presents to the ED for concern of abdominal pain, this involves an extensive number of treatment options, and is a complaint that carries with it a high risk of complications and morbidity. The emergent differential diagnosis prior to evaluation includes, but is not limited to,  The differential diagnosis for generalized abdominal pain includes, but is not limited to AAA, gastroenteritis, appendicitis, Bowel obstruction, Bowel perforation. Gastroparesis, DKA, Hernia, Inflammatory bowel disease, mesenteric ischemia, pancreatitis, peritonitis SBP, volvulus.  This is not an exhaustive differential.   Past Medical History / Co-morbidities / Social History:  has a past medical history of Anxiety, Choledocholithiasis, Crohn's ileocolitis (HCC), Depression, Essential  hypertension, and GERD (gastroesophageal reflux disease).  Additional history: Chart reviewed. Pertinent results include: seen by both [facility], has ERCP scheduled with [facility] on 12/1  Physical Exam: Physical exam performed. The pertinent findings include: Well-appearing, abdomen soft nontender  Lab Tests: I ordered, and personally interpreted labs.  The pertinent results include: No leukocytosis, liver enzymes stable from previous, lipase 82  Consultations Obtained: I requested consultation with the GI on call Harlene Mail, PA-C,  and discussed lab and imaging findings as well as pertinent plan - they recommend: they discussed patient with GI on call with [place] Dr. Avram who will see the patient, plan for clear liquid diet for now, NPO at midnight with plan for ERCP likely tomorrow.  Disposition: After consideration of the diagnostic results and the patients response to treatment, I feel that patient will require admission per GI for ERCP.  Discussed patient with hospitalist Dr. Zella who accepts patient for admission.  Final diagnoses:  Epigastric pain    ED Discharge Orders     None          Nora Lauraine DELENA DEVONNA 05/09/24 [place] Randol Simmonds, MD 05/10/24 [phone]

## 2024-05-09 NOTE — ED Triage Notes (Signed)
 Gallstone attack starting today does not have a gallbladder. Recently had a procedure a few weeks ago for removal of gallstones. Encouraged to come to this facility if she had another attack.

## 2024-05-09 NOTE — H&P (View-Only) (Signed)
 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  The risks and benefits as well as alternatives of endoscopic procedure(s) have been discussed and reviewed. All questions answered. The patient agrees to proceed. She understands there are risks not limited to but including of bleeding infection reactions to medications for anesthesia, injury requiring surgery and pancreatitis  She can have full liquids tonight and then n.p.o. after midnight.  MiraLAX daily for bowel habit issues  Needs urology evaluation as outpatient for left kidney lesion concerning for malignancy  This is high complexity medical decision making in a patient requiring endoscopic removal of symptomatic common bile duct stones.  Annette CHARLENA Commander, MD, [facility] [place] [facility] See AMION on call - gastroenterology for best contact person 05/09/2024 6:34 PM          HPI:   Annette Briggs is a 83 y.o. female with a history of Crohn's ileocolitis and choledocholithiasis status post ERCP by Dr. Abran on 02/06/2024, with removal of 10 to 12 stones up to 15 mm in diameter.  She started to have recurrent intermittent epigastric pain and low chest  pain that radiates into the back.  She ended up having an MRCP on April 17, 2024 that demonstrated choledocholithiasis.  We were contacted and she was scheduled for outpatient ERCP December 1.  She has had recurrent episodes and had another episode today described as severe.  Sometimes she gets shakes or chills.  She has not been jaundiced she has not had nausea or vomiting.  She feels well now that she is in the ED.  The spells last for an hour or so at times.  She is being admitted to treat this problem.  Other issues is that she has developed hard ball-like stools that are difficult to expel from the rectum, after switching from Stelara  to Wezlana .  She used to have diarrhea.  She did start Urso  deoxycholic acid and is taking that 300 mg twice daily, September 10.  GI review of systems otherwise negative.   MRCP 04/17/2024-images reviewed personally  MPRESSION: 1. Redemonstration of choledocholithiasis with mild intra and extrahepatic bile duct dilation. No obstructing mass. 2. Redemonstration of heterogeneous, enhancing 1.0 x 1.2 cm lesion arising from the left kidney upper pole, laterally, concerning for renal cell carcinoma. No significant interval change. 3. Multiple other nonacute observations, as described above.  Past Medical History:  Diagnosis Date   Anxiety    Choledocholithiasis    Crohn's ileocolitis (HCC)    Depression    Essential hypertension    GERD (gastroesophageal reflux disease)     Past Surgical History:  Procedure Laterality Date   APPENDECTOMY     BLADDER SUSPENSION     CHOLECYSTECTOMY     COLONOSCOPY     2008   COLONOSCOPY N/A 02/13/2013   Procedure: COLONOSCOPY;  Surgeon: Claudis  RAYMOND Rivet, MD;  [facility];  Service: Endoscopy;  Laterality: N/A;  730   COLONOSCOPY N/A 01/15/2019   Procedure: COLONOSCOPY;  Surgeon: Rivet Claudis RAYMOND, MD;  [facility];  Service: Endoscopy;  Laterality: N/A;  100   ERCP N/A 02/06/2024   Procedure:  ERCP, WITH INTERVENTION IF INDICATED;  Surgeon: Abran Norleen SAILOR, MD;  Location: [facility] ENDOSCOPY;  Service: Gastroenterology;  Laterality: N/A;   ESOPHAGOGASTRODUODENOSCOPY N/A 03/18/2020   Procedure: ESOPHAGOGASTRODUODENOSCOPY (EGD);  Surgeon: Rivet Claudis RAYMOND, MD;  [facility];  Service: Endoscopy;  Laterality: N/A;  225, moved up per office   LEFT HEART CATH AND CORONARY ANGIOGRAPHY N/A 02/02/2020   Procedure: LEFT HEART CATH AND CORONARY ANGIOGRAPHY;  Surgeon: Wonda Sharper, MD;  Location: [facility] INVASIVE CV LAB;  Service: Cardiovascular;  Laterality: N/A;   TOTAL ABDOMINAL HYSTERECTOMY      Family History  Problem Relation Age of Onset   Heart failure Mother    Kidney failure Father    Breast cancer Sister    Alzheimer's disease Sister      Social History   Tobacco Use   Smoking status: Never    Passive exposure: Never   Smokeless tobacco: Never  Vaping Use   Vaping status: Never Used  Substance Use Topics   Alcohol use: No    Alcohol/week: 0.0 standard drinks of alcohol   Drug use: No    Prior to Admission medications   Medication Sig Start Date End Date Taking? Authorizing Provider  alum & mag hydroxide-simeth (MAALOX/MYLANTA) 200-200-20 MG/5ML suspension Take 15 mLs by mouth every 6 (six) hours as needed for indigestion, heartburn or flatulence. 02/07/24   Cheryle Page, MD  Cholecalciferol (DIALYVITE VITAMIN D  5000) 125 MCG (5000 UT) capsule Take 5,000 Units by mouth daily.    [provider]  Loperamide HCl (IMODIUM PO) Take 2-4 mg by mouth every 4 (four) hours as needed (Diarrhea).    [provider]  loratadine  (CLARITIN ) 10 MG tablet Take 10 mg by mouth daily.    [provider]  metoprolol  succinate (TOPROL -XL) 25 MG 24 hr tablet Take 25 mg by mouth at bedtime.    [provider]  Multiple Vitamin (MULTIVITAMIN) tablet Take 1 tablet by mouth daily.    [provider]  ondansetron  (ZOFRAN ) 4 MG tablet Take 1 tablet (4 mg  total) by mouth every 6 (six) hours as needed for nausea. 02/07/24   Cheryle Page, MD  pantoprazole  (PROTONIX ) 40 MG tablet Take 1 tablet (40 mg total) by mouth daily. 02/03/24 04/08/24  Cleotilde Rogue, MD  psyllium (METAMUCIL SMOOTH TEXTURE) 58.6 % powder Take 1 packet by mouth at bedtime. Patient taking differently: Take 1 packet by mouth at bedtime. Patient eats the fiber cookies when she remembers to take the fiber. 06/28/21   Rivet Claudis RAYMOND, MD  traMADol  (ULTRAM ) 50 MG tablet Take 1 tablet (50 mg total) by mouth every 6 (six) hours as needed for severe pain (pain score 7-10). 02/07/24   Cheryle Page, MD  ursodiol  (ACTIGALL ) 300 MG capsule Take 1 capsule (300 mg total) by mouth 2 (two) times daily. Patient not taking: Reported on 04/08/2024 03/12/24   Carlan, Chelsea L, NP  Ustekinumab -auub (WEZLANA ) 90 MG/ML SOSY Inject 90 mg into the skin every 6 (six) weeks. 12/31/23   Castaneda Mayorga, Daniel, MD  venlafaxine  XR (EFFEXOR -XR) 37.5 MG 24 hr capsule Take 37.5 mg by mouth daily.    [provider]    Current  Facility-Administered Medications  Medication Dose Route Frequency Provider Last Rate Last Admin   acetaminophen  (TYLENOL ) tablet 650 mg  650 mg Oral Q6H PRN Zella, Mir M, MD       Or   acetaminophen  (TYLENOL ) suppository 650 mg  650 mg Rectal Q6H PRN Zella, Mir M, MD       albuterol (PROVENTIL) (2.5 MG/3ML) 0.083% nebulizer solution 2.5 mg  2.5 mg Nebulization Q2H PRN Zella, Mir M, MD       HYDROmorphone  (DILAUDID ) injection 0.5-1 mg  0.5-1 mg Intravenous Q2H PRN Zella, Mir M, MD       ondansetron  (ZOFRAN ) tablet 4 mg  4 mg Oral Q6H PRN Zella, Mir M, MD       Or   ondansetron  (ZOFRAN ) injection 4 mg  4 mg Intravenous Q6H PRN Zella, Mir M, MD       oxyCODONE (Oxy IR/ROXICODONE) immediate release tablet 5 mg  5 mg Oral Q4H PRN Zella, Mir M, MD       traZODone (DESYREL) tablet 25 mg  25 mg Oral QHS PRN Zella Katha HERO, MD        Allergies as of  05/09/2024 - Review Complete 05/09/2024  Allergen Reaction Noted   Ciprofloxacin Other (See Comments) 02/05/2013   Pneumovax [pneumococcal polysaccharide vaccine] Swelling 02/07/2013   Wellbutrin [bupropion] Anxiety 02/05/2013     Review of Systems:    This is positive for those things mentioned in the HPI. All other review of systems are negative.       Physical Exam:  Vital signs in last 24 hours: Temp:  [97.9 F (36.6 C)] 97.9 F (36.6 C) (11/07 1415) Pulse Rate:  [85] 85 (11/07 1415) Resp:  [20] 20 (11/07 1415) BP: (145)/(78) 145/78 (11/07 1415) SpO2:  [100 %] 100 % (11/07 1415)    General:  Well-developed, well-nourished and in no acute distress Eyes:  anicteric. ENT:   Mouth and posterior pharynx free of lesions.  Lungs: Clear to auscultation bilaterally. Heart:   S1S2, no rubs, murmurs, gallops. Abdomen:  soft, non-tender, no hepatosplenomegaly, hernia, or mass and BS+.  Neuro:  A&O x 3.  Psych:  appropriate mood and  Affect.   Data Reviewed:   LAB RESULTS: Recent Labs    05/09/24 1433  WBC 8.0  HGB 13.5  HCT 42.8  PLT 255   BMET Recent Labs    05/09/24 1433  NA 142  K 4.3  CL 103  CO2 29  GLUCOSE 93  BUN 17  CREATININE 0.83  CALCIUM 9.9   LFT Recent Labs    05/09/24 1433  PROT 7.4  ALBUMIN 4.2  AST 38  ALT 47*  ALKPHOS 150*  BILITOT 0.3   Lab Results  Component Value Date   LIPASE 82 (H) 05/09/2024       Thanks   LOS: 0 days   @Roddy Bellamy  CHARLENA Commander, MD, [facility] @  05/09/2024, 6:29 PM

## 2024-05-09 NOTE — H&P (Signed)
 History and Physical  Annette Briggs FMW:[id number] DOB: 01-15-1941 DOA: 05/09/2024  PCP: Shona Norleen PEDLAR, MD   Chief Complaint: Abdominal pain  HPI: Annette Briggs is a 83 y.o. female with medical history significant for Crohn's colitis, depression, hypertension, GERD and choledocholithiasis being admitted to the hospital with recurrent abdominal pain and choledocholithiasis.  She has a history of cholecystectomy about 50 years ago, was admitted to [facility] in August 2025 with abdominal pain elevated LFTs MRCP showed multiple intra and extrahepatic biliary stones and she underwent ERCP during that hospitalization with removal of 10 to 12 stones.  She was discharged home after in stable condition, she tells me that since that time, she has had intermittent episodes of abdominal pain with associated mild nausea.  This happened often 2-3 times a week, would last several hours up to a day.  She is followed by gastroenterology in [place], due to her continued intermittent symptoms, she has an outpatient ERCP scheduled with [facility] 12/1.  However due to persistent symptoms over the last few days, her GI providers recommended that she come to the ER for evaluation.  Review of Systems: Please see HPI for pertinent positives and negatives. A complete 10 system review of systems are otherwise negative.  Past Medical History:  Diagnosis Date   Anxiety    Crohn's colitis (HCC)    Depression    Essential hypertension    GERD (gastroesophageal reflux disease)    Past Surgical History:  Procedure Laterality Date   APPENDECTOMY     BLADDER SUSPENSION     CHOLECYSTECTOMY     COLONOSCOPY     2008   COLONOSCOPY N/A 02/13/2013   Procedure: COLONOSCOPY;  Surgeon: Claudis RAYMOND Rivet, MD;  [facility];  Service: Endoscopy;  Laterality: N/A;  730   COLONOSCOPY N/A 01/15/2019   Procedure: COLONOSCOPY;  Surgeon: Rivet Claudis RAYMOND, MD;  [facility];  Service: Endoscopy;  Laterality: N/A;  100   ERCP  N/A 02/06/2024   Procedure: ERCP, WITH INTERVENTION IF INDICATED;  Surgeon: Abran Norleen SAILOR, MD;  Location: [facility] ENDOSCOPY;  Service: Gastroenterology;  Laterality: N/A;   ESOPHAGOGASTRODUODENOSCOPY N/A 03/18/2020   Procedure: ESOPHAGOGASTRODUODENOSCOPY (EGD);  Surgeon: Rivet Claudis RAYMOND, MD;  [facility];  Service: Endoscopy;  Laterality: N/A;  225, moved up per office   LEFT HEART CATH AND CORONARY ANGIOGRAPHY N/A 02/02/2020   Procedure: LEFT HEART CATH AND CORONARY ANGIOGRAPHY;  Surgeon: Wonda Sharper, MD;  Location: [facility] INVASIVE CV LAB;  Service: Cardiovascular;  Laterality: N/A;   TOTAL ABDOMINAL HYSTERECTOMY     Social History:  reports that she has never smoked. She has never been exposed to tobacco smoke. She has never used smokeless tobacco. She reports that she does not drink alcohol and does not use drugs.  Allergies  Allergen Reactions   Ciprofloxacin Other (See Comments)    Patient can't remember reaction.   Pneumovax [Pneumococcal Polysaccharide Vaccine] Swelling   Wellbutrin [Bupropion] Anxiety    Anxiety attack    Family History  Problem Relation Age of Onset   Heart failure Mother    Kidney failure Father    Breast cancer Sister    Alzheimer's disease Sister      Prior to Admission medications   Medication Sig Start Date End Date Taking? Authorizing Provider  alum & mag hydroxide-simeth (MAALOX/MYLANTA) 200-200-20 MG/5ML suspension Take 15 mLs by mouth every 6 (six) hours as needed for indigestion, heartburn or flatulence. 02/07/24   Cheryle Page,  MD  Cholecalciferol (DIALYVITE VITAMIN D  5000) 125 MCG (5000 UT) capsule Take 5,000 Units by mouth daily.    [provider]  Loperamide HCl (IMODIUM PO) Take 2-4 mg by mouth every 4 (four) hours as needed (Diarrhea).    [provider]  loratadine  (CLARITIN ) 10 MG tablet Take 10 mg by mouth daily.    [provider]  metoprolol  succinate (TOPROL -XL) 25 MG 24 hr tablet Take 25 mg by mouth at  bedtime.    [provider]  Multiple Vitamin (MULTIVITAMIN) tablet Take 1 tablet by mouth daily.    [provider]  ondansetron  (ZOFRAN ) 4 MG tablet Take 1 tablet (4 mg total) by mouth every 6 (six) hours as needed for nausea. 02/07/24   Cheryle Page, MD  pantoprazole  (PROTONIX ) 40 MG tablet Take 1 tablet (40 mg total) by mouth daily. 02/03/24 04/08/24  Cleotilde Rogue, MD  psyllium (METAMUCIL SMOOTH TEXTURE) 58.6 % powder Take 1 packet by mouth at bedtime. Patient taking differently: Take 1 packet by mouth at bedtime. Patient eats the fiber cookies when she remembers to take the fiber. 06/28/21   Golda Claudis PENNER, MD  traMADol  (ULTRAM ) 50 MG tablet Take 1 tablet (50 mg total) by mouth every 6 (six) hours as needed for severe pain (pain score 7-10). 02/07/24   Cheryle Page, MD  ursodiol  (ACTIGALL ) 300 MG capsule Take 1 capsule (300 mg total) by mouth 2 (two) times daily. Patient not taking: Reported on 04/08/2024 03/12/24   Carlan, Chelsea L, NP  Ustekinumab -auub (WEZLANA ) 90 MG/ML SOSY Inject 90 mg into the skin every 6 (six) weeks. 12/31/23   Castaneda Mayorga, Daniel, MD  venlafaxine  XR (EFFEXOR -XR) 37.5 MG 24 hr capsule Take 37.5 mg by mouth daily.    [provider]    Physical Exam: BP (!) 145/78 (BP Location: Left Arm)   Pulse 85   Temp 97.9 F (36.6 C) (Oral)   Resp 20   SpO2 100%  General:  Alert, oriented, calm, in no acute distress  Eyes: EOMI, clear conjuctivae, white sclerea Neck: supple, no masses, trachea mildline  Cardiovascular: RRR, no murmurs or rubs, no peripheral edema  Respiratory: clear to auscultation bilaterally, no wheezes, no crackles  Abdomen: soft, nontender, nondistended, normal bowel tones heard  Skin: dry, no rashes  Musculoskeletal: no joint effusions, normal range of motion  Psychiatric: appropriate affect, normal speech  Neurologic: extraocular muscles intact, clear speech, moving all extremities with intact sensorium          Labs on Admission:  Basic Metabolic Panel: Recent Labs  Lab 05/09/24 1433  NA 142  K 4.3  CL 103  CO2 29  GLUCOSE 93  BUN 17  CREATININE 0.83  CALCIUM 9.9   Liver Function Tests: Recent Labs  Lab 05/09/24 1433  AST 38  ALT 47*  ALKPHOS 150*  BILITOT 0.3  PROT 7.4  ALBUMIN 4.2   Recent Labs  Lab 05/09/24 1433  LIPASE 82*   No results for input(s): AMMONIA in the last 168 hours. CBC: Recent Labs  Lab 05/09/24 1433  WBC 8.0  HGB 13.5  HCT 42.8  MCV 88.6  PLT 255   Cardiac Enzymes: No results for input(s): CKTOTAL, CKMB, CKMBINDEX, TROPONINI in the last 168 hours. BNP (last 3 results) No results for input(s): BNP in the last 8760 hours.  ProBNP (last 3 results) No results for input(s): PROBNP in the last 8760 hours.  CBG: No results for input(s): GLUCAP in the last 168 hours.  Radiological Exams on Admission: No results found. Assessment/Plan KALA AMBRIZ is a 83 y.o. female with medical history significant for Crohn's colitis, depression, hypertension, GERD and choledocholithiasis being admitted to the hospital with recurrent abdominal pain and choledocholithiasis.   Choledocholithiasis-with intra and extrahepatic biliary ductal dilatation seen on MRCP 10/16.  There was a plan for outpatient ERCP 12/1 but patient continues to be symptomatic on a nearly daily basis. -Observation admission -Clear liquid diet n.p.o. after midnight -Pain and nausea medication as needed -[facility] consulted, they plan ERCP tomorrow  Crohn's-stable without evidence of flare  Abnormal LFTs-mild alk phos and ALT elevation likely due to choledocholithiasis, will monitor  Hypertension-will plan to continue home Toprol  in the morning once medications are confirmed  DVT prophylaxis: SCDs    Code Status: Full Code  Consults called: [facility]  Admission status: Observation  Time spent: 49 minutes  Tekesha Almgren CHRISTELLA Gail MD [facility] Pager  [phone]  If 7PM-7AM, please contact night-coverage [email or web address] Password [facility]  05/09/2024, 5:49 PM

## 2024-05-10 ENCOUNTER — Encounter (HOSPITAL_COMMUNITY): Payer: Self-pay | Admitting: Internal Medicine

## 2024-05-10 ENCOUNTER — Encounter (HOSPITAL_COMMUNITY)
Admission: EM | Disposition: A | Discharge status: Home / Self Care | Payer: Self-pay | Source: Home / Self Care | Attending: Emergency Medicine

## 2024-05-10 ENCOUNTER — Other Ambulatory Visit: Payer: Self-pay

## 2024-05-10 ENCOUNTER — Observation Stay (HOSPITAL_COMMUNITY): Admitting: Anesthesiology

## 2024-05-10 ENCOUNTER — Observation Stay (HOSPITAL_COMMUNITY)

## 2024-05-10 DIAGNOSIS — N289 Disorder of kidney and ureter, unspecified: Secondary | ICD-10-CM | POA: Diagnosis not present

## 2024-05-10 DIAGNOSIS — Z9889 Other specified postprocedural states: Secondary | ICD-10-CM

## 2024-05-10 DIAGNOSIS — K5900 Constipation, unspecified: Secondary | ICD-10-CM | POA: Diagnosis not present

## 2024-05-10 DIAGNOSIS — Z9049 Acquired absence of other specified parts of digestive tract: Secondary | ICD-10-CM | POA: Diagnosis not present

## 2024-05-10 DIAGNOSIS — K838 Other specified diseases of biliary tract: Secondary | ICD-10-CM | POA: Diagnosis not present

## 2024-05-10 DIAGNOSIS — K8050 Calculus of bile duct without cholangitis or cholecystitis without obstruction: Secondary | ICD-10-CM | POA: Diagnosis not present

## 2024-05-10 LAB — SURGICAL PCR SCREEN
MRSA, PCR: NEGATIVE
Staphylococcus aureus: NEGATIVE

## 2024-05-10 LAB — COMPREHENSIVE METABOLIC PANEL WITH GFR
ALT: 33 U/L (ref 0–44)
AST: 25 U/L (ref 15–41)
Albumin: 3.7 g/dL (ref 3.5–5.0)
Alkaline Phosphatase: 122 U/L (ref 38–126)
Anion gap: 9 (ref 5–15)
BUN: 12 mg/dL (ref 8–23)
CO2: 28 mmol/L (ref 22–32)
Calcium: 9.5 mg/dL (ref 8.9–10.3)
Chloride: 105 mmol/L (ref 98–111)
Creatinine, Ser: 0.84 mg/dL (ref 0.44–1.00)
GFR, Estimated: >60 mL/min (ref >60)
Glucose, Bld: 93 mg/dL (ref 70–99)
Potassium: 4 mmol/L (ref 3.5–5.1)
Sodium: 142 mmol/L (ref 135–145)
Total Bilirubin: 0.4 mg/dL (ref 0.0–1.2)
Total Protein: 6.5 g/dL (ref 6.5–8.1)

## 2024-05-10 LAB — CBC
HCT: 41.4 % (ref 36.0–46.0)
Hemoglobin: 12.9 g/dL (ref 12.0–15.0)
MCH: 27.9 pg (ref 26.0–34.0)
MCHC: 31.2 g/dL (ref 30.0–36.0)
MCV: 89.6 fL (ref 80.0–100.0)
Platelets: 210 K/uL (ref 150–400)
RBC: 4.62 MIL/uL (ref 3.87–5.11)
RDW: 12.9 % (ref 11.5–15.5)
WBC: 5 K/uL (ref 4.0–10.5)
nRBC: 0 % (ref 0.0–0.2)

## 2024-05-10 LAB — LIPASE, BLOOD: Lipase: 47 U/L (ref 11–51)

## 2024-05-10 SURGERY — ERCP, WITH INTERVENTION IF INDICATED
Anesthesia: General

## 2024-05-10 MED ORDER — AMISULPRIDE (ANTIEMETIC) 5 MG/2ML IV SOLN: 10.0000 mg | Freq: Once | INTRAVENOUS | Status: DC | PRN

## 2024-05-10 MED ORDER — GLUCAGON HCL RDNA (DIAGNOSTIC) 1 MG IJ SOLR
INTRAMUSCULAR | Status: AC
  Filled 2024-05-10: qty 1

## 2024-05-10 MED ORDER — SUGAMMADEX SODIUM 200 MG/2ML IV SOLN
INTRAVENOUS | Status: DC | PRN
Start: 2024-05-10 — End: 2024-05-10
  Administered 2024-05-10: 150 mg via INTRAVENOUS

## 2024-05-10 MED ORDER — DICLOFENAC SUPPOSITORY 100 MG
RECTAL | Status: DC | PRN
  Administered 2024-05-10: 100 mg via RECTAL

## 2024-05-10 MED ORDER — FENTANYL CITRATE (PF) 100 MCG/2ML IJ SOLN
INTRAMUSCULAR | Status: AC
  Filled 2024-05-10: qty 2

## 2024-05-10 MED ORDER — DEXAMETHASONE SOD PHOSPHATE PF 10 MG/ML IJ SOLN
INTRAMUSCULAR | Status: DC | PRN
  Administered 2024-05-10: 5 mg via INTRAVENOUS

## 2024-05-10 MED ORDER — ROCURONIUM BROMIDE 10 MG/ML (PF) SYRINGE
PREFILLED_SYRINGE | INTRAVENOUS | Status: DC | PRN
  Administered 2024-05-10: 40 mg via INTRAVENOUS

## 2024-05-10 MED ORDER — DICLOFENAC SUPPOSITORY 100 MG: 100.0000 mg | Freq: Once | RECTAL | Status: DC

## 2024-05-10 MED ORDER — ONDANSETRON HCL 4 MG/2ML IJ SOLN
INTRAMUSCULAR | Status: DC | PRN
Start: 2024-05-10 — End: 2024-05-10
  Administered 2024-05-10: 4 mg via INTRAVENOUS

## 2024-05-10 MED ORDER — SODIUM CHLORIDE 0.9 % IV SOLN
INTRAVENOUS | Status: DC | PRN
Start: 2024-05-10 — End: 2024-05-10
  Administered 2024-05-10: 100 mL

## 2024-05-10 MED ORDER — DICLOFENAC SUPPOSITORY 100 MG
RECTAL | Status: AC
  Filled 2024-05-10: qty 1

## 2024-05-10 MED ORDER — PROPOFOL 10 MG/ML IV BOLUS
INTRAVENOUS | Status: DC | PRN
  Administered 2024-05-10: 80 mg via INTRAVENOUS

## 2024-05-10 MED ORDER — FENTANYL CITRATE (PF) 250 MCG/5ML IJ SOLN
INTRAMUSCULAR | Status: DC | PRN
  Administered 2024-05-10 (×2): 25 ug via INTRAVENOUS
  Administered 2024-05-10: 50 ug via INTRAVENOUS

## 2024-05-10 MED ORDER — LIDOCAINE 2% (20 MG/ML) 5 ML SYRINGE
INTRAMUSCULAR | Status: DC | PRN
  Administered 2024-05-10: 60 mg via INTRAVENOUS

## 2024-05-10 MED ORDER — CIPROFLOXACIN IN D5W 400 MG/200ML IV SOLN
INTRAVENOUS | Status: AC
  Filled 2024-05-10: qty 200

## 2024-05-10 NOTE — Op Note (Signed)
 [facility] Patient Name: Annette Briggs Procedure Date: 05/10/2024 MRN: [id number] Attending MD: Lupita FORBES Commander , MD, [id number] Date of Birth: 01-28-1941 CSN: [id number] Age: 83 Admit Type: Inpatient Procedure:                ERCP Indications:              Bile duct stone(s), For therapy of bile duct                            stone(s) Providers:                Lupita CHARLENA Commander, MD, Almarie Masters, RN, [facility]                            [place], Technician, Leighton Agent, CRNA Referring MD:             Dr. Eartha and [facility] Medicines:                General Anesthesia, Ampicillin -sulbactam 1.5 g IV                            on-call and diclofenac  100 mg per rectum on-call Complications:            No immediate complications. Estimated Blood Loss:     Estimated blood loss was minimal. Procedure:                Pre-Anesthesia Assessment:                           - Prior to the procedure, a History and Physical                            was performed, and patient medications and                            allergies were reviewed. The patient's tolerance of                            previous anesthesia was also reviewed. The risks                            and benefits of the procedure and the sedation                            options and risks were discussed with the patient.                            All questions were answered, and informed consent                            was obtained. Prior Anticoagulants: The patient has                            taken no anticoagulant or antiplatelet agents. ASA  Grade Assessment: II - A patient with mild systemic                            disease. After reviewing the risks and benefits,                            the patient was deemed in satisfactory condition to                            undergo the procedure.                           After obtaining informed consent, the scope  was                            passed under direct vision. Throughout the                            procedure, the patient's blood pressure, pulse, and                            oxygen saturations were monitored continuously. The                            [id number]-Q190V ([id number]) Olympus duodenoscope was                            introduced through the mouth, and used to inject                            contrast into and used to inject contrast into the                            bile duct. The ERCP was accomplished without                            difficulty. The patient tolerated the procedure                            well. Scope In: Scope Out: Findings:      The scout film was normal. The esophagus was not seen well, the stomach       was grossly normal. The duodenum revealed a previous biliary       sphincterotomy at the major papilla. Minor papilla was seen also and       looked normal. The papilla was friable and there was contact bleeding.       The biliary orifice was cannulated freely with a sphincterotome and       wire. Rx 44 sphincterotome and 0.035 Hydra Jagwire was used. The orifice       was somewhat stenosed. Contrast was injected but the common bile duct       and hepatic duct were significantly dilated with difficulty filling this       out due to the size. I elected to perform balloon sphincteroplasty and  an 8 ?" 9 ?" 10 mm dilating balloon was sequentially inflated using       fluoroscopic guidance. Copious bile flowed after this dilation. The       12-15 mm retrieval balloon was then passed over the wire and multiple       balloon sweeps performed. There was one 12 mm stone seen and removed.       Other sweeps produced fragments and sludge. A 2 cm extraction basket was       also dragged throughout the duct and there was no debris or sludge or       other fragments removed. Pancreas was not entered. Though there was some       oozing of blood during  manipulation there was no bleeding at the end of       the procedure. I personally interpreted the fluoroscopic images. Impression:               - Choledocholithiasis was found. Complete removal                            was accomplished through a pre-existing biliary                            sphincterotomy. Moderate Sedation:      Not Applicable - Patient had care per Anesthesia. Recommendation:           - Return to floor                           Clear liquid diet                           If she does well throughout the day she can be                            discharged this afternoon. May have full liquids at                            discharge and then resume regular diet tomorrow.                           Follow-up with primary gastroenterologist Dr.                            Eartha in [place].                           Continue Ursodeoxycholic acid. She had delayed                            starting this due to fear of potential side effects                            and only started it last month.                           I called her daughter [name] Pepper and explained  results and recommendations.                           Procedure report given to patient. Procedure Code(s):        --- Professional ---                           [phone], 59, Endoscopic retrograde                            cholangiopancreatography (ERCP); with                            trans-endoscopic balloon dilation of                            biliary/pancreatic duct(s) or of ampulla                            (sphincteroplasty), including sphincterotomy, when                            performed, each duct                           43264, Endoscopic retrograde                            cholangiopancreatography (ERCP); with removal of                            calculi/debris from biliary/pancreatic duct(s)                           25671, Endoscopic  catheterization of the biliary                            ductal system, radiological supervision and                            interpretation Diagnosis Code(s):        --- Professional ---                           K80.50, Calculus of bile duct without cholangitis                            or cholecystitis without obstruction CPT copyright 2022 [employer]. All rights reserved. The codes documented in this report are preliminary and upon coder review may  be revised to meet current compliance requirements. Lupita FORBES Commander, MD 05/10/2024 10:02:16 AM This report has been signed electronically. Number of Addenda: 0

## 2024-05-10 NOTE — Transfer of Care (Signed)
 Immediate Anesthesia Transfer of Care Note  Patient: Annette Briggs  Procedure(s) Performed: ERCP, WITH INTERVENTION IF INDICATED  Patient Location: PACU  Anesthesia Type:General  Level of Consciousness: drowsy and patient cooperative  Airway & Oxygen Therapy: Patient Spontanous Breathing and Patient connected to face mask oxygen  Post-op Assessment: Report given to RN and Post -op Vital signs reviewed and stable  Post vital signs: Reviewed and stable  Last Vitals:  Vitals Value Taken Time  BP 147/62 05/10/24 10:03  Temp    Pulse 74 05/10/24 10:03  Resp 16 05/10/24 10:03  SpO2 100 % 05/10/24 10:03    Last Pain:  Vitals:   05/10/24 0829  TempSrc: Temporal  PainSc: 0-No pain         Complications: No notable events documented.

## 2024-05-10 NOTE — Care Management Obs Status (Signed)
 MEDICARE OBSERVATION STATUS NOTIFICATION   Patient Details  Name: Annette Briggs MRN: [id number] Date of Birth: January 04, 1941   Medicare Observation Status Notification Given:  Yes    Cylas Falzone LILLETTE Fenton, LCSW 05/10/2024, 1:55 PM

## 2024-05-10 NOTE — Hospital Course (Signed)
 Annette Briggs is a 83 y.o. female with medical history significant for Crohn's colitis, depression, hypertension, GERD and choledocholithiasis being admitted to the hospital with recurrent abdominal pain and choledocholithiasis.  She has a history of cholecystectomy about 50 years ago, was admitted to [facility] in August 2025 with abdominal pain elevated LFTs MRCP showed multiple intra and extrahepatic biliary stones and she underwent ERCP during that hospitalization with removal of 10 to 12 stones.  She was discharged home after in stable condition; since that time, she has had intermittent episodes of abdominal pain with associated mild nausea.  This happened often 2-3 times a week, would last several hours up to a day.  She is followed by gastroenterology in [place], due to her continued intermittent symptoms, she has an outpatient ERCP scheduled with [facility] 12/1.  However due to persistent symptoms over the last few days, her GI providers recommended that she come to the ER for evaluation.   She underwent ERCP on 05/10/2024.  She underwent balloon sphincteroplasty with sequential dilating balloon. 12 mm stone seen and removed along with sweeping of the duct noted to have debris and sludge following.  Abdominal pain was resolved post procedure. She tolerated trial of clear liquids. She was continued on full liquids at discharge and instructed okay for regular diet beginning 05/11/2024.   Of note, MRCP from 04/17/2024 noted left kidney upper pole lesion measuring 1 x 1.2 cm concerning for RCC.  This was seen on prior MRCP on 02/04/2024 also.  Unclear if she has been referred to urology yet or if findings have been discussed with her previously.

## 2024-05-10 NOTE — Anesthesia Procedure Notes (Signed)
 Procedure Name: Intubation Date/Time: 05/10/2024 8:49 AM  Performed by: Cena Epps, CRNAPre-anesthesia Checklist: Patient identified, Emergency Drugs available, Suction available and Patient being monitored Patient Re-evaluated:Patient Re-evaluated prior to induction Oxygen Delivery Method: Circle System Utilized Preoxygenation: Pre-oxygenation with 100% oxygen Induction Type: IV induction Ventilation: Mask ventilation without difficulty Laryngoscope Size: Mac and 3 Grade View: Grade I Tube type: Oral Tube size: 7.0 mm Number of attempts: 1 Airway Equipment and Method: Stylet and Oral airway Placement Confirmation: ETT inserted through vocal cords under direct vision, positive ETCO2 and breath sounds checked- equal and bilateral Secured at: 22 cm Tube secured with: Tape Dental Injury: Teeth and Oropharynx as per pre-operative assessment

## 2024-05-10 NOTE — Discharge Instructions (Signed)
 May have full liquids at discharge and then resume regular diet on 11/9.

## 2024-05-10 NOTE — Interval H&P Note (Signed)
 History and Physical Interval Note:  05/10/2024 8:27 AM  Annette Briggs  has presented today for surgery, with the diagnosis of choledocholithisais.  The various methods of treatment have been discussed with the patient and family. After consideration of risks, benefits and other options for treatment, the patient has consented to  Procedure(s): ERCP, WITH INTERVENTION IF INDICATED (N/A) as a surgical intervention.  The patient's history has been reviewed, patient examined, no change in status, stable for surgery.  I have reviewed the patient's chart and labs.  Questions were answered to the patient's satisfaction.     Lupita Commander

## 2024-05-10 NOTE — Discharge Summary (Signed)
 Physician Discharge Summary   Annette Briggs FMW:[id number] DOB: 1940-09-01 DOA: 05/09/2024  PCP: Shona Norleen PEDLAR, MD  Admit date: 05/09/2024 Discharge date: 05/10/2024  Admitted From: Home Disposition:  Home Discharging physician: Alm Apo, MD Barriers to discharge: none  Recommendations at discharge: See MRCP regarding left renal lesion and discuss with patient if not already done so    Discharge Condition: stable CODE STATUS: Full  Diet recommendation:  Diet Orders (From admission, onward)     Start     Ordered   05/10/24 1053  Diet clear liquid Fluid consistency: Thin  Diet effective now       Question:  Fluid consistency:  Answer:  Thin   05/10/24 1052            Hospital Course: Annette Briggs is a 83 y.o. female with medical history significant for Crohn's colitis, depression, hypertension, GERD and choledocholithiasis being admitted to the hospital with recurrent abdominal pain and choledocholithiasis.  She has a history of cholecystectomy about 50 years ago, was admitted to [facility] in August 2025 with abdominal pain elevated LFTs MRCP showed multiple intra and extrahepatic biliary stones and she underwent ERCP during that hospitalization with removal of 10 to 12 stones.  She was discharged home after in stable condition; since that time, she has had intermittent episodes of abdominal pain with associated mild nausea.  This happened often 2-3 times a week, would last several hours up to a day.  She is followed by gastroenterology in [place], due to her continued intermittent symptoms, she has an outpatient ERCP scheduled with [facility] 12/1.  However due to persistent symptoms over the last few days, her GI providers recommended that she come to the ER for evaluation.   She underwent ERCP on 05/10/2024.  She underwent balloon sphincteroplasty with sequential dilating balloon. 12 mm stone seen and removed along with sweeping of the duct noted to have debris and sludge  following.  Abdominal pain was resolved post procedure. She tolerated trial of clear liquids. She was continued on full liquids at discharge and instructed okay for regular diet beginning 05/11/2024.   Of note, MRCP from 04/17/2024 noted left kidney upper pole lesion measuring 1 x 1.2 cm concerning for RCC.  This was seen on prior MRCP on 02/04/2024 also.  Unclear if she has been referred to urology yet or if findings have been discussed with her previously.    The patient's acute and chronic medical conditions were treated accordingly. On day of discharge, patient was felt deemed stable for discharge. Patient/family member advised to call PCP or come back to ER if needed.   Principal Diagnosis: Choledocholithiasis  Discharge Diagnoses: Active Hospital Problems   Diagnosis Date Noted   Choledocholithiasis 02/06/2024    Priority: 1.   Lesion of left native kidney 05/09/2024    Priority: 2.    Resolved Hospital Problems  No resolved problems to display.     Discharge Instructions     Increase activity slowly   Complete by: As directed       Allergies as of 05/10/2024       Reactions   Ciprofloxacin Other (See Comments)   Patient can't remember reaction.   Pneumovax [pneumococcal Polysaccharide Vaccine] Swelling   Wellbutrin [bupropion] Anxiety   Anxiety attack        Medication List     TAKE these medications    alum & mag hydroxide-simeth 200-200-20 MG/5ML suspension Commonly known as: MAALOX/MYLANTA Take 15 mLs by mouth every  6 (six) hours as needed for indigestion, heartburn or flatulence.   Dialyvite Vitamin D  5000 125 MCG (5000 UT) capsule Generic drug: Cholecalciferol Take 5,000 Units by mouth daily.   IMODIUM PO Take 2-4 mg by mouth every 4 (four) hours as needed (Diarrhea).   loratadine  10 MG tablet Commonly known as: CLARITIN  Take 10 mg by mouth daily.   Metamucil Smooth Texture 58.6 % powder Generic drug: psyllium Take 1 packet by mouth at  bedtime. What changed: additional instructions   metoprolol  succinate 25 MG 24 hr tablet Commonly known as: TOPROL -XL Take 25 mg by mouth at bedtime.   multivitamin tablet Take 1 tablet by mouth daily.   ondansetron  4 MG tablet Commonly known as: ZOFRAN  Take 1 tablet (4 mg total) by mouth every 6 (six) hours as needed for nausea.   pantoprazole  40 MG tablet Commonly known as: PROTONIX  Take 1 tablet (40 mg total) by mouth daily.   traMADol  50 MG tablet Commonly known as: Ultram  Take 1 tablet (50 mg total) by mouth every 6 (six) hours as needed for severe pain (pain score 7-10).   ursodiol  300 MG capsule Commonly known as: ACTIGALL  Take 1 capsule (300 mg total) by mouth 2 (two) times daily.   venlafaxine  XR 37.5 MG 24 hr capsule Commonly known as: EFFEXOR -XR Take 37.5 mg by mouth daily.   Wezlana  90 MG/ML Sosy Generic drug: Ustekinumab -auub Inject 90 mg into the skin every 6 (six) weeks.        Follow-up Information     Avram Lupita BRAVO, MD Follow up.   Specialty: Gastroenterology Why: You do not need to follow-up with Dr. Avram, he will cancel your December 1 appointment for ERCP since you had it this admission. Contact information: [address] [place] [address] [phone]                Allergies  Allergen Reactions   Ciprofloxacin Other (See Comments)    Patient can't remember reaction.   Pneumovax [Pneumococcal Polysaccharide Vaccine] Swelling   Wellbutrin [Bupropion] Anxiety    Anxiety attack    Consultations: GI  Procedures: 11/8: ERCP  Discharge Exam: BP (!) 146/73 (BP Location: Left Arm)   Pulse 74   Temp (!) 97.4 F (36.3 C) (Axillary)   Resp 16   Ht 5' 1 (1.549 m)   Wt 58.1 kg   SpO2 97%   BMI 24.19 kg/m  Physical Exam Constitutional:      Appearance: Normal appearance.  HENT:     Head: Normocephalic and atraumatic.     Mouth/Throat:     Mouth: Mucous membranes are moist.  Eyes:     Extraocular Movements:  Extraocular movements intact.  Cardiovascular:     Rate and Rhythm: Normal rate and regular rhythm.  Pulmonary:     Effort: Pulmonary effort is normal. No respiratory distress.     Breath sounds: Normal breath sounds. No wheezing.  Abdominal:     General: Bowel sounds are normal. There is no distension.     Palpations: Abdomen is soft.     Tenderness: There is no abdominal tenderness.  Musculoskeletal:        General: Normal range of motion.     Cervical back: Normal range of motion and neck supple.  Skin:    General: Skin is warm and dry.  Neurological:     General: No focal deficit present.     Mental Status: She is alert.  Psychiatric:  Mood and Affect: Mood normal.      The results of significant diagnostics from this hospitalization (including imaging, microbiology, ancillary and laboratory) are listed below for reference.   Microbiology: Recent Results (from the past 240 hours)  Surgical pcr screen     Status: None   Collection Time: 05/10/24  8:05 AM   Specimen: Nasal Mucosa; Nasal Swab  Result Value Ref Range Status   MRSA, PCR NEGATIVE NEGATIVE Final   Staphylococcus aureus NEGATIVE NEGATIVE Final    Comment: (NOTE) The Xpert SA Assay (FDA approved for NASAL specimens in patients 35 years of age and older), is one component of a comprehensive surveillance program. It is not intended to diagnose infection nor to guide or monitor treatment. Performed at [facility], [address], [place], [place] [address]      Labs: BNP (last 3 results) No results for input(s): BNP in the last 8760 hours. Basic Metabolic Panel: Recent Labs  Lab 05/09/24 1433 05/10/24 0522  NA 142 142  K 4.3 4.0  CL 103 105  CO2 29 28  GLUCOSE 93 93  BUN 17 12  CREATININE 0.83 0.84  CALCIUM 9.9 9.5   Liver Function Tests: Recent Labs  Lab 05/09/24 1433 05/10/24 0522  AST 38 25  ALT 47* 33  ALKPHOS 150* 122  BILITOT 0.3 0.4  PROT 7.4 6.5  ALBUMIN  4.2 3.7   Recent Labs  Lab 05/09/24 1433 05/10/24 0522  LIPASE 82* 47   No results for input(s): AMMONIA in the last 168 hours. CBC: Recent Labs  Lab 05/09/24 1433 05/10/24 0522  WBC 8.0 5.0  HGB 13.5 12.9  HCT 42.8 41.4  MCV 88.6 89.6  PLT 255 210   Cardiac Enzymes: No results for input(s): CKTOTAL, CKMB, CKMBINDEX, TROPONINI in the last 168 hours. BNP: Invalid input(s): POCBNP CBG: No results for input(s): GLUCAP in the last 168 hours. D-Dimer No results for input(s): DDIMER in the last 72 hours. Hgb A1c No results for input(s): HGBA1C in the last 72 hours. Lipid Profile No results for input(s): CHOL, HDL, LDLCALC, TRIG, CHOLHDL, LDLDIRECT in the last 72 hours. Thyroid function studies No results for input(s): TSH, T4TOTAL, T3FREE, THYROIDAB in the last 72 hours.  Invalid input(s): FREET3 Anemia work up No results for input(s): VITAMINB12, FOLATE, FERRITIN, TIBC, IRON, RETICCTPCT in the last 72 hours. Urinalysis    Component Value Date/Time   COLORURINE YELLOW 03/01/2024 1628   APPEARANCEUR CLEAR 03/01/2024 1628   LABSPEC 1.025 03/01/2024 1628   PHURINE 6.0 03/01/2024 1628   GLUCOSEU NEGATIVE 03/01/2024 1628   HGBUR NEGATIVE 03/01/2024 1628   BILIRUBINUR NEGATIVE 03/01/2024 1628   KETONESUR NEGATIVE 03/01/2024 1628   PROTEINUR 30 (A) 03/01/2024 1628   NITRITE NEGATIVE 03/01/2024 1628   LEUKOCYTESUR TRACE (A) 03/01/2024 1628   Sepsis Labs Recent Labs  Lab 05/09/24 1433 05/10/24 0522  WBC 8.0 5.0   Microbiology Recent Results (from the past 240 hours)  Surgical pcr screen     Status: None   Collection Time: 05/10/24  8:05 AM   Specimen: Nasal Mucosa; Nasal Swab  Result Value Ref Range Status   MRSA, PCR NEGATIVE NEGATIVE Final   Staphylococcus aureus NEGATIVE NEGATIVE Final    Comment: (NOTE) The Xpert SA Assay (FDA approved for NASAL specimens in patients 64 years of age and older), is one  component of a comprehensive surveillance program. It is not intended to diagnose infection nor to guide or monitor treatment. Performed at [facility], [address], [place], [place] [address]     Procedures/Studies: DG C-Arm 1-60 Min-No Report Result Date: 05/10/2024 Fluoroscopy was utilized by the requesting physician.  No radiographic interpretation.   MR ABDOMEN MRCP W WO CONTAST Result Date: 04/17/2024 CLINICAL DATA:  history of choledocolithiasis, elevated liver enzymes and intermittent upper abdominal pain. EXAM: MRI ABDOMEN WITHOUT AND WITH CONTRAST (INCLUDING MRCP) TECHNIQUE: Multiplanar multisequence MR imaging of the abdomen was performed both before and after the administration of intravenous contrast. Heavily T2-weighted images of the biliary and pancreatic ducts were obtained, and three-dimensional MRCP images were rendered by post processing. CONTRAST:  5mL GADAVIST  GADOBUTROL  1 MMOL/ML IV SOLN COMPARISON:  CT scan abdomen and pelvis from 03/01/2024 and MRI abdomen from 02/04/2024. FINDINGS: Lower chest: Unremarkable MR appearance to the lung bases. No pleural effusion. No pericardial effusion. Normal heart size. Hepatobiliary: The liver is normal in size. Noncirrhotic configuration. No focal lesion. Mild central intrahepatic bile duct dilation. Extrahepatic bile duct is also dilated measuring up to 13 mm in the proximal portion, 11-12 mm in the midportion and 10 mm in the distal portion. It abruptly tapers with smooth margins just before the ampulla of Vater. Degree of biliary dilation is essentially similar to the prior MRI from 02/04/2024. Redemonstration of 2-3, subcentimeter sized filling defects in the distal extrahepatic duct, overall less in volume when compared to the prior exam, compatible with choledocholithiasis. No obstructing mass. Status post cholecystectomy. Pancreas: No mass, inflammatory changes or other parenchymal abnormality identified. No main  pancreatic duct dilation. Spleen:  Within normal limits in size and appearance. No focal mass. Adrenals/Urinary Tract: Unremarkable adrenal glands. No hydroureteronephrosis on either side. There multiple scattered sinus cysts throughout bilateral kidneys ranging in size from few mm up to 1.2 cm. Redemonstration of heterogeneous, enhancing partially exophytic 1.0 x 1.2 cm lesion arising from the left kidney upper pole, laterally. No significant interval change. Stomach/Bowel: Visualized portions within the abdomen are unremarkable. No disproportionate dilation of bowel loops. Multiple colonic diverticula noted without diverticulitis. There is tiny sliding hiatal hernia. Vascular/Lymphatic: No pathologically enlarged lymph nodes identified. No abdominal aortic aneurysm demonstrated. No ascites. Other:  None. Musculoskeletal: No suspicious bone lesions identified. Hemangioma noted in the L2 vertebral body. IMPRESSION: 1. Redemonstration of choledocholithiasis with mild intra and extrahepatic bile duct dilation. No obstructing mass. 2. Redemonstration of heterogeneous, enhancing 1.0 x 1.2 cm lesion arising from the left kidney upper pole, laterally, concerning for renal cell carcinoma. No significant interval change. 3. Multiple other nonacute observations, as described above. Electronically Signed   By: Ree Molt M.D.   On: 04/17/2024 10:47   MR 3D Recon At Scanner Result Date: 04/17/2024 CLINICAL DATA:  history of choledocolithiasis, elevated liver enzymes and intermittent upper abdominal pain. EXAM: MRI ABDOMEN WITHOUT AND WITH CONTRAST (INCLUDING MRCP) TECHNIQUE: Multiplanar multisequence MR imaging of the abdomen was performed both before and after the administration of intravenous contrast. Heavily T2-weighted images of the biliary and pancreatic ducts were obtained, and three-dimensional MRCP images were rendered by post processing. CONTRAST:  5mL GADAVIST  GADOBUTROL  1 MMOL/ML IV SOLN COMPARISON:  CT  scan abdomen and pelvis from 03/01/2024 and MRI abdomen from 02/04/2024. FINDINGS: Lower chest: Unremarkable MR appearance to the lung bases. No pleural effusion. No pericardial effusion. Normal heart size. Hepatobiliary: The liver is normal in size. Noncirrhotic configuration. No focal lesion. Mild central intrahepatic bile duct dilation. Extrahepatic bile duct is also dilated measuring up to 13 mm in the proximal portion, 11-12 mm in  the midportion and 10 mm in the distal portion. It abruptly tapers with smooth margins just before the ampulla of Vater. Degree of biliary dilation is essentially similar to the prior MRI from 02/04/2024. Redemonstration of 2-3, subcentimeter sized filling defects in the distal extrahepatic duct, overall less in volume when compared to the prior exam, compatible with choledocholithiasis. No obstructing mass. Status post cholecystectomy. Pancreas: No mass, inflammatory changes or other parenchymal abnormality identified. No main pancreatic duct dilation. Spleen:  Within normal limits in size and appearance. No focal mass. Adrenals/Urinary Tract: Unremarkable adrenal glands. No hydroureteronephrosis on either side. There multiple scattered sinus cysts throughout bilateral kidneys ranging in size from few mm up to 1.2 cm. Redemonstration of heterogeneous, enhancing partially exophytic 1.0 x 1.2 cm lesion arising from the left kidney upper pole, laterally. No significant interval change. Stomach/Bowel: Visualized portions within the abdomen are unremarkable. No disproportionate dilation of bowel loops. Multiple colonic diverticula noted without diverticulitis. There is tiny sliding hiatal hernia. Vascular/Lymphatic: No pathologically enlarged lymph nodes identified. No abdominal aortic aneurysm demonstrated. No ascites. Other:  None. Musculoskeletal: No suspicious bone lesions identified. Hemangioma noted in the L2 vertebral body. IMPRESSION: 1. Redemonstration of choledocholithiasis with  mild intra and extrahepatic bile duct dilation. No obstructing mass. 2. Redemonstration of heterogeneous, enhancing 1.0 x 1.2 cm lesion arising from the left kidney upper pole, laterally, concerning for renal cell carcinoma. No significant interval change. 3. Multiple other nonacute observations, as described above. Electronically Signed   By: Ree Molt M.D.   On: 04/17/2024 10:47     Time coordinating discharge: Over 30 minutes    Alm Apo, MD  [facility] 05/10/2024, 4:02 PM

## 2024-05-10 NOTE — Progress Notes (Signed)
 Alva LITTIE Pepper to be discharged home per MD order. Discussed with the patient and daughter and all questions fully answered.  Skin clean, dry, and intact without evidence of skin break down. IV catheter discontinued intact. Site without signs and symptoms of complications. Dressing and pressure applied.  An After Visit Summary was printed and given to the patient.  Annette Briggs  05/10/2024 2:25 PM

## 2024-05-10 NOTE — Plan of Care (Signed)
  Problem: Education: Goal: Knowledge of General Education information will improve Description: Including pain rating scale, medication(s)/side effects and non-pharmacologic comfort measures Outcome: Progressing   Problem: Clinical Measurements: Goal: Ability to maintain clinical measurements within normal limits will improve Outcome: Progressing   Problem: Activity: Goal: Risk for activity intolerance will decrease Outcome: Progressing   Problem: Elimination: Goal: Will not experience complications related to bowel motility Outcome: Progressing Goal: Will not experience complications related to urinary retention Outcome: Progressing   Problem: Pain Managment: Goal: General experience of comfort will improve and/or be controlled Outcome: Progressing   Problem: Safety: Goal: Ability to remain free from injury will improve Outcome: Progressing   Problem: Skin Integrity: Goal: Risk for impaired skin integrity will decrease Outcome: Progressing

## 2024-05-10 NOTE — Progress Notes (Signed)
   05/10/24 1409  TOC Brief Assessment  Insurance and Status Reviewed  Patient has primary care physician Yes (HALL, JOHN Z)  Home environment has been reviewed From home  Prior level of function: Independent  Prior/Current Home Services No current home services  Social Drivers of Health Review SDOH reviewed no interventions necessary  Readmission risk has been reviewed Yes  Transition of care needs no transition of care needs at this time   MOON delivered at bedside.

## 2024-05-10 NOTE — Anesthesia Postprocedure Evaluation (Signed)
 Anesthesia Post Note  Patient: Annette Briggs  Procedure(s) Performed: ERCP, WITH INTERVENTION IF INDICATED     Patient location during evaluation: PACU Anesthesia Type: General Level of consciousness: awake Pain management: pain level controlled Vital Signs Assessment: post-procedure vital signs reviewed and stable Respiratory status: spontaneous breathing, nonlabored ventilation and respiratory function stable Cardiovascular status: blood pressure returned to baseline and stable Postop Assessment: no apparent nausea or vomiting Anesthetic complications: no   No notable events documented.  Last Vitals:  Vitals:   05/10/24 1030 05/10/24 1048  BP: (!) 143/77 (!) 146/73  Pulse: 68 74  Resp: 17 16  Temp: 36.4 C (!) 36.3 C  SpO2: 93% 97%    Last Pain:  Vitals:   05/10/24 1048  TempSrc: Axillary  PainSc:                  Delon Aisha Arch

## 2024-05-10 NOTE — Anesthesia Preprocedure Evaluation (Addendum)
 Anesthesia Evaluation  Patient identified by MRN, date of birth, ID band Patient awake    Reviewed: Allergy & Precautions, NPO status , Patient's Chart, lab work & pertinent test results  History of Anesthesia Complications Negative for: history of anesthetic complications  Airway Mallampati: III  TM Distance: >3 FB Neck ROM: Full   Comment: Previous grade I view with MAC 3, easy mask Dental  (+) Dental Advisory Given   Pulmonary neg pulmonary ROS   Pulmonary exam normal breath sounds clear to auscultation       Cardiovascular hypertension, (-) angina (-) Past MI, (-) Cardiac Stents and (-) CABG + dysrhythmias (1st degree AV block)  Rhythm:Regular Rate:Normal  TTE 09/13/2022: IMPRESSIONS    1. Left ventricular ejection fraction, by estimation, is 60 to 65%. The  left ventricle has normal function. The left ventricle has no regional  wall motion abnormalities. Left ventricular diastolic parameters are  consistent with Grade I diastolic  dysfunction (impaired relaxation).   2. Right ventricular systolic function is normal. The right ventricular  size is normal. Tricuspid regurgitation signal is inadequate for assessing  PA pressure.   3. The mitral valve is normal in structure. No evidence of mitral valve  regurgitation. No evidence of mitral stenosis.   4. The aortic valve is tricuspid. Aortic valve regurgitation is not  visualized. No aortic stenosis is present.   5. The inferior vena cava is normal in size with greater than 50%  respiratory variability, suggesting right atrial pressure of 3 mmHg.   LHC 02/02/2020: 1.  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  Normal LV function with normal LVEDP, LVEF estimated at 55%     Neuro/Psych  PSYCHIATRIC DISORDERS Anxiety Depression    negative neurological ROS     GI/Hepatic Neg  liver ROS,GERD  ,,Choledocholithiasis, Crohn's   Endo/Other  negative endocrine ROS    Renal/GU Renal disease (left renal mass)     Musculoskeletal   Abdominal   Peds  Hematology negative hematology ROS (+) Lab Results      Component                Value               Date                      WBC                      5.0                 05/10/2024                HGB                      12.9                05/10/2024                HCT                      41.4                05/10/2024                MCV  89.6                05/10/2024                PLT                      210                 05/10/2024              Anesthesia Other Findings 83 y.o. female with medical history significant for Crohn's colitis, depression, hypertension, GERD and choledocholithiasis being admitted to the hospital with recurrent abdominal pain and choledocholithiasis  Reproductive/Obstetrics                              Anesthesia Physical Anesthesia Plan  ASA: 2  Anesthesia Plan: General   Post-op Pain Management: Minimal or no pain anticipated   Induction:   PONV Risk Score and Plan: 3 and Ondansetron , Dexamethasone  and Treatment may vary due to age or medical condition  Airway Management Planned: Oral ETT  Additional Equipment:   Intra-op Plan:   Post-operative Plan: Extubation in OR  Informed Consent: I have reviewed the patients History and Physical, chart, labs and discussed the procedure including the risks, benefits and alternatives for the proposed anesthesia with the patient or authorized representative who has indicated his/her understanding and acceptance.     Dental advisory given  Plan Discussed with: CRNA and Anesthesiologist  Anesthesia Plan Comments: (Risks of general anesthesia discussed including, but not limited to, sore throat, hoarse voice, chipped/damaged teeth, injury to vocal cords, nausea and vomiting,  allergic reactions, lung infection, heart attack, stroke, and death. All questions answered. )         Anesthesia Quick Evaluation

## 2024-05-12 ENCOUNTER — Encounter (HOSPITAL_COMMUNITY): Payer: Self-pay | Admitting: Internal Medicine

## 2024-05-15 DIAGNOSIS — M1711 Unilateral primary osteoarthritis, right knee: Secondary | ICD-10-CM | POA: Diagnosis not present

## 2024-05-22 DIAGNOSIS — M1711 Unilateral primary osteoarthritis, right knee: Secondary | ICD-10-CM | POA: Diagnosis not present

## 2024-05-23 ENCOUNTER — Telehealth (INDEPENDENT_AMBULATORY_CARE_PROVIDER_SITE_OTHER): Payer: Self-pay

## 2024-05-23 NOTE — Telephone Encounter (Signed)
 Patient has not received her Wezlana  from [facility] and does not have the phone number to call them. She says she was supposed to take her injection today 05/23/2024,but [facility] has not mailed it to her. I gave the patient the phone number to [facility] at [phone] and asked that she call them and have them expedite the script. Patient states understanding.

## 2024-06-02 ENCOUNTER — Other Ambulatory Visit (HOSPITAL_COMMUNITY): Payer: Self-pay | Admitting: Nurse Practitioner

## 2024-06-02 ENCOUNTER — Encounter (HOSPITAL_COMMUNITY): Payer: Self-pay

## 2024-06-02 ENCOUNTER — Ambulatory Visit (HOSPITAL_COMMUNITY): Admit: 2024-06-02 | Admitting: Internal Medicine

## 2024-06-02 DIAGNOSIS — Z13820 Encounter for screening for osteoporosis: Secondary | ICD-10-CM

## 2024-06-02 SURGERY — ERCP, WITH INTERVENTION IF INDICATED
Anesthesia: General

## 2024-06-04 ENCOUNTER — Telehealth (INDEPENDENT_AMBULATORY_CARE_PROVIDER_SITE_OTHER): Payer: Self-pay

## 2024-06-04 NOTE — Telephone Encounter (Signed)
 Patient wanted me to try to get assistance for Wezlana  for 2026. I advised that I would need her new insurance card for 2026 and Rx card to submit to insurance to see if they will pay for her Wezlana , as the patient is currently making payments to Medicare to get her monthly script. Patient says she did change plans and she will bring in the cards once she receives them. I did make her aware to go ahead around 06/23/2024 and call for a refill, as she says she is due for her next shot on 07/05/2023. I made her aware the insurance goes into effect on 07/04/2023, and that we would not be able to submit a PA on this until after the new year. Patient will bring all her information to me once she receives it, and states understanding of all and the next steps.

## 2024-06-10 ENCOUNTER — Inpatient Hospital Stay (HOSPITAL_COMMUNITY)
Admission: RE | Admit: 2024-06-10 | Discharge: 2024-06-10 | Attending: Nurse Practitioner | Admitting: Nurse Practitioner

## 2024-06-10 DIAGNOSIS — Z780 Asymptomatic menopausal state: Secondary | ICD-10-CM | POA: Diagnosis not present

## 2024-06-10 DIAGNOSIS — Z13820 Encounter for screening for osteoporosis: Secondary | ICD-10-CM

## 2024-06-10 DIAGNOSIS — M85832 Other specified disorders of bone density and structure, left forearm: Secondary | ICD-10-CM | POA: Diagnosis not present

## 2024-06-23 NOTE — Telephone Encounter (Signed)
 Patient called today 06/23/2024 and asked for [facility]'s phone number. She will call [phone].

## 2024-07-04 NOTE — Telephone Encounter (Signed)
 I called and left the patient a message, asked that she please bring by her new insurance card so we can see if we can get assistance for her Annette Briggs .

## 2024-07-08 ENCOUNTER — Telehealth (INDEPENDENT_AMBULATORY_CARE_PROVIDER_SITE_OTHER): Payer: Self-pay

## 2024-07-08 NOTE — Telephone Encounter (Signed)
 I submitted the Prior Authorization to the patient's plan.

## 2024-07-08 NOTE — Telephone Encounter (Signed)
 Patient states she had some light headiness and chest pressure on 07/02/2024 while waiting to board a flight. She thought this may have been related to gall stones passing. I asked if she had any abdominal pain, nausea or vomiting at the time or now and patient denies any of this, and is not currently having these symptoms. I told the patient she may want to call her pcp about this, as this could have been related to anything,but doubt related to gall stone being stuck or passing as she denies any abdominal pain, nausea, vomiting or yellowing of skin. Patient says she will reach out to the pcp regarding this.

## 2024-07-08 NOTE — Telephone Encounter (Signed)
 I spoke with Delon Clay with [employer], she says for the patient to call [employer] patient assistance at [phone] and ask them for assistance with the co pay through Medicare which is currently $175.00 per month as the patient Medicare deductible is 2,100 per year, this is divided by 12 months. She says to tell the patient to ask for assistance as she can no longer afford the $175.00 payments she is making to her insurance now. She said for the patient to do this as soon as possible as the $175.00 payments will automatically renew at the beginning of the year. She asked that the patient call them and ask if there are any grants such as (PAN) or the [employer] that could help with her Crohn's. I spoke with the patient and made her aware of all of this and she says she will follow through with doing this.

## 2024-07-08 NOTE — Telephone Encounter (Signed)
 Annette Briggs (Key: [place]) PA Case ID #: [id number] Need Help? Call us  at [phone] Outcome Approved today by [employer] 2017 06-JAN-26:06-JAN-27 Wezlana  90MG /ML [place] SOSY Quantity:1; Drug Wezlana  90MG /ML syringes ePA cloud logo Form [employer] Medicare Electronic Prior Authorization Form 2017 NCPDP

## 2024-07-16 NOTE — Telephone Encounter (Signed)
 Annette Briggs from [place], called to give an update, she says they were unable to get financial assistance for the patient until her next fill in February, at that time the patient needs to ask the pharmacy for assistance, to let them know she can no longer afford the $ 175.00 per month through [facility] payments.

## 2024-07-17 NOTE — Telephone Encounter (Signed)
 I spoke with the patient to reminder her that at her next fill in February, at that time the patient needs to ask the pharmacy for assistance, to let them know she can no longer afford the $ 175.00 per month through [facility] payments.      Patient states understanding and says she has this information written down so when she fills her medication in Feb she will ask them for assistance at that time.

## 2024-07-31 ENCOUNTER — Telehealth (INDEPENDENT_AMBULATORY_CARE_PROVIDER_SITE_OTHER): Payer: Self-pay

## 2024-07-31 ENCOUNTER — Emergency Department (HOSPITAL_COMMUNITY)

## 2024-07-31 ENCOUNTER — Encounter (HOSPITAL_COMMUNITY): Payer: Self-pay

## 2024-07-31 ENCOUNTER — Emergency Department (HOSPITAL_COMMUNITY)
Admission: EM | Admit: 2024-07-31 | Discharge: 2024-07-31 | Disposition: A | Discharge status: Home / Self Care | Source: Home / Self Care | Attending: Emergency Medicine | Admitting: Emergency Medicine

## 2024-07-31 ENCOUNTER — Other Ambulatory Visit: Payer: Self-pay

## 2024-07-31 DIAGNOSIS — R1013 Epigastric pain: Secondary | ICD-10-CM | POA: Insufficient documentation

## 2024-07-31 LAB — URINALYSIS, ROUTINE W REFLEX MICROSCOPIC
Bacteria, UA: NONE SEEN
Bilirubin Urine: NEGATIVE
Glucose, UA: NEGATIVE mg/dL
Hgb urine dipstick: NEGATIVE
Ketones, ur: NEGATIVE mg/dL
Nitrite: NEGATIVE
Protein, ur: NEGATIVE mg/dL
Specific Gravity, Urine: 1.05 — ABNORMAL HIGH (ref 1.005–1.030)
pH: 6 (ref 5.0–8.0)

## 2024-07-31 LAB — COMPREHENSIVE METABOLIC PANEL WITH GFR
ALT: 22 U/L (ref 0–44)
AST: 31 U/L (ref 15–41)
Albumin: 4.3 g/dL (ref 3.5–5.0)
Alkaline Phosphatase: 90 U/L (ref 38–126)
Anion gap: 11 (ref 5–15)
BUN: 18 mg/dL (ref 8–23)
CO2: 27 mmol/L (ref 22–32)
Calcium: 9.6 mg/dL (ref 8.9–10.3)
Chloride: 102 mmol/L (ref 98–111)
Creatinine, Ser: 0.93 mg/dL (ref 0.44–1.00)
GFR, Estimated: >60 mL/min
Glucose, Bld: 90 mg/dL (ref 70–99)
Potassium: 4.2 mmol/L (ref 3.5–5.1)
Sodium: 140 mmol/L (ref 135–145)
Total Bilirubin: 0.4 mg/dL (ref 0.0–1.2)
Total Protein: 7.2 g/dL (ref 6.5–8.1)

## 2024-07-31 LAB — CBC WITH DIFFERENTIAL/PLATELET
Abs Immature Granulocytes: 0.02 10*3/uL (ref 0.00–0.07)
Basophils Absolute: 0 10*3/uL (ref 0.0–0.1)
Basophils Relative: 1 %
Eosinophils Absolute: 0 10*3/uL (ref 0.0–0.5)
Eosinophils Relative: 1 %
HCT: 40.4 % (ref 36.0–46.0)
Hemoglobin: 13.9 g/dL (ref 12.0–15.0)
Immature Granulocytes: 0 %
Lymphocytes Relative: 23 %
Lymphs Abs: 1.3 10*3/uL (ref 0.7–4.0)
MCH: 30.1 pg (ref 26.0–34.0)
MCHC: 34.4 g/dL (ref 30.0–36.0)
MCV: 87.4 fL (ref 80.0–100.0)
Monocytes Absolute: 0.5 10*3/uL (ref 0.1–1.0)
Monocytes Relative: 8 %
Neutro Abs: 3.8 10*3/uL (ref 1.7–7.7)
Neutrophils Relative %: 67 %
Platelets: 206 10*3/uL (ref 150–400)
RBC: 4.62 MIL/uL (ref 3.87–5.11)
RDW: 13.2 % (ref 11.5–15.5)
WBC: 5.7 10*3/uL (ref 4.0–10.5)
nRBC: 0 % (ref 0.0–0.2)

## 2024-07-31 LAB — LIPASE, BLOOD: Lipase: 46 U/L (ref 11–51)

## 2024-07-31 MED ORDER — IOHEXOL 350 MG/ML SOLN
75.0000 mL | Freq: Once | INTRAVENOUS | Status: AC | PRN
  Administered 2024-07-31: 75 mL via INTRAVENOUS

## 2024-07-31 NOTE — ED Provider Triage Note (Signed)
 Emergency Medicine Provider Triage Evaluation Note  Annette Briggs , a 84 y.o. female  was evaluated in triage.  Pt complains of right upper abdominal pain for the last few hours.  Patient has a history of choledocholithiasis, states that she has had several episodes in the past where this feels the same.  States that the episode this morning made her break out in a sweat, have severe epigastric pain that did not radiate, and was associated with nausea.  States that the episode lasted 30 minutes to an hour, however she is in no acute distress and denies pain at this time.  Review of Systems  Positive: Epigastric pain, nausea Negative: SOB, chest pain  Physical Exam  BP (!) 169/70   Pulse 69   Temp 97.9 F (36.6 C)   Resp 18   Ht 5' 1 (1.549 m)   Wt 58.1 kg   SpO2 100%   BMI 24.19 kg/m  Gen:   Awake, no distress   Resp:  Normal effort  MSK:   Moves extremities without difficulty  Other:  RUQ/epigastric pain TTP  Medical Decision Making  Medically screening exam initiated at 3:02 PM.  Appropriate orders placed.  Annette Briggs was informed that the remainder of the evaluation will be completed by another provider, this initial triage assessment does not replace that evaluation, and the importance of remaining in the ED until their evaluation is complete.    Annette Briggs, [name] 07/31/24 [phone]

## 2024-07-31 NOTE — Telephone Encounter (Signed)
 Patient called today stating she was having severe pain in her mid section and felt like she is about to pass out, and broke out in a cold sweat. She says this occurred around 15 minutes prior to her call to us  and has since eased. She has a history of cholecystectomy about 50 years ago, was admitted to [facility] in August 2025 with abdominal pain elevated LFTs MRCP showed multiple intra and extrahepatic biliary stones and she underwent ERCP during that hospitalization with removal of 10 to 12 stones.    Patient currently denies any yellowing of skin or eyes, denies any nausea or vomiting.   She says this also occurred a few nights ago, and it woke her from her sleep.   I did advise her to go to the ED at [facility], as it is possible she may require an ERCP, as this is not done locally. Patient states understanding and says she will go to the hospital.

## 2024-07-31 NOTE — ED Triage Notes (Signed)
 Pt has hx of gallstones and had a spell of abdominal pain earlier that feels like same. Pt said she broke out in sweats, lightheaded and felt like she was going to pass out. C/o mid abdominal pain, denies NV. Pain 0/10 at this time just lightheaded.

## 2024-07-31 NOTE — Discharge Instructions (Addendum)
 You came to the ED for abdominal pain and concern for gallbladder stones.  Your CT showed that there were no stones in your bile duct.  An ultrasound also showed that there were no stones in your bile duct.  Your blood work was normal and your liver function tests were normal as well.  Unclear what may have caused your pain at this time but it is possible that you may have had a stone passing through your bile duct.  Would be a good idea to follow-up with your gastroenterologist to discuss further management and possible reduction of these events.  In the meantime you can try to manage her symptoms with your over-the-counter Pepcid , Maalox, and the pantoprazole  that you have been prescribed in the past.  Please return to the ED if you have worsening abdominal pain, fevers, chills, jaundice (which is yellowing of the skin)   Thank you for letting us  be part of your care

## 2024-07-31 NOTE — ED Provider Notes (Signed)
 " Annette Briggs Provider Note   CSN: [id number] Arrival date & time: 07/31/24  1326     Patient presents with: Abdominal Pain   Annette Briggs is a 84 y.o. female with past medical history of Crohn's disease, cholecystectomy, choledocholithiasis after cholecystectomy requiring ERCP presents after a bout of abdominal pain.  She describes the pain as a spell that started around 11 AM.  Pain is epigastric in nature and radiates laterally on both sides.  She states that she felt something moving around and then her pain subsided.  Her pain started at about a 5 out of 10 level, progressed to a 9 out of 10 and is now 0 out of 10.  She states that she called her GI doctor and they told her to come here.  She does report that she had some sweats earlier today.  She denied nausea, vomiting, or changes to her bowel or bladder habits.  She is a good appetite and has been eating as normal.  The patient says that normally when she has these stones her liver function tests are elevated.  This does feel like episodes that have been caused by her gallstones in the past for her.  She also has reflux which she attributes to a hiatal hernia.  At the time of my evaluation, the patient describes her symptoms as pressure in her epigastric region.  She does not this think that this feels like her stone episode anymore but rather more related to reflux.  She denies chest pain or shortness of breath.  She denies syncope or presyncope.  She denies lightheadedness and dizziness.    Abdominal Pain      Prior to Admission medications  Medication Sig Start Date End Date Taking? Authorizing Provider  alum & mag hydroxide-simeth (MAALOX/MYLANTA) 200-200-20 MG/5ML suspension Take 15 mLs by mouth every 6 (six) hours as needed for indigestion, heartburn or flatulence. 02/07/24   Cheryle Page, MD  Cholecalciferol (DIALYVITE VITAMIN D  5000) 125 MCG (5000 UT) capsule Take 5,000 Units by mouth  daily.    [provider]  Loperamide HCl (IMODIUM PO) Take 2-4 mg by mouth every 4 (four) hours as needed (Diarrhea).    [provider]  loratadine  (CLARITIN ) 10 MG tablet Take 10 mg by mouth daily.    [provider]  metoprolol  succinate (TOPROL -XL) 25 MG 24 hr tablet Take 25 mg by mouth at bedtime.    [provider]  Multiple Vitamin (MULTIVITAMIN) tablet Take 1 tablet by mouth daily.    [provider]  ondansetron  (ZOFRAN ) 4 MG tablet Take 1 tablet (4 mg total) by mouth every 6 (six) hours as needed for nausea. 02/07/24   Cheryle Page, MD  pantoprazole  (PROTONIX ) 40 MG tablet Take 1 tablet (40 mg total) by mouth daily. 02/03/24 04/08/24  Cleotilde Rogue, MD  psyllium (METAMUCIL SMOOTH TEXTURE) 58.6 % powder Take 1 packet by mouth at bedtime. Patient taking differently: Take 1 packet by mouth at bedtime. Patient eats the fiber cookies when she remembers to take the fiber. 06/28/21   Golda Claudis PENNER, MD  traMADol  (ULTRAM ) 50 MG tablet Take 1 tablet (50 mg total) by mouth every 6 (six) hours as needed for severe pain (pain score 7-10). 02/07/24   Cheryle Page, MD  ursodiol  (ACTIGALL ) 300 MG capsule Take 1 capsule (300 mg total) by mouth 2 (two) times daily. Patient not taking: Reported on 04/08/2024 03/12/24   Mariette Mitzie LITTIE, NP  Ustekinumab -auub (WEZLANA ) 90 MG/ML SOSY Inject 90 mg into the skin every 6 (six) weeks. 12/31/23   Castaneda Mayorga, Daniel, MD  venlafaxine  XR (EFFEXOR -XR) 37.5 MG 24 hr capsule Take 37.5 mg by mouth daily.    [provider]    Allergies: Ciprofloxacin , Pneumovax [pneumococcal polysaccharide vaccine], and Wellbutrin [bupropion]    Review of Systems  Gastrointestinal:  Positive for abdominal pain.    Updated Vital Signs BP (!) 167/87   Pulse 79   Temp 98.1 F (36.7 C) (Oral)   Resp 16   Ht 5' 1 (1.549 m)   Wt 58.1 kg   SpO2 100%   BMI 24.19 kg/m   Physical Exam Constitutional:      General: She is  not in acute distress.    Appearance: She is not ill-appearing.  Eyes:     General: No scleral icterus. Cardiovascular:     Rate and Rhythm: Normal rate and regular rhythm.  Pulmonary:     Effort: Pulmonary effort is normal.  Abdominal:     General: Bowel sounds are normal. There is no distension. There are no signs of injury.     Palpations: Abdomen is soft. There is no hepatomegaly or mass.     Tenderness: There is no abdominal tenderness. There is no guarding. Negative signs include Murphy's sign, Rovsing's sign and McBurney's sign.     Hernia: No hernia is present.  Skin:    Coloration: Skin is not jaundiced or pale.  Neurological:     Mental Status: She is alert.     (all labs ordered are listed, but only abnormal results are displayed) Labs Reviewed  LIPASE, BLOOD  CBC WITH DIFFERENTIAL/PLATELET  COMPREHENSIVE METABOLIC PANEL WITH GFR  URINALYSIS, ROUTINE W REFLEX MICROSCOPIC    EKG: EKG Interpretation Date/Time:  Thursday July 31 2024 15:17:33 EST Ventricular Rate:  68 PR Interval:  196 QRS Duration:  86 QT Interval:  386 QTC Calculation: 410 R Axis:   9  Text Interpretation: Normal sinus rhythm Nonspecific ST abnormality Abnormal ECG When compared with ECG of 01-Mar-2024 16:04, PREVIOUS ECG IS PRESENT similar to prior no stemi Confirmed by Elnor Savant ([id number]) on 07/31/2024 9:26:49 PM  Radiology: CT ABDOMEN PELVIS W CONTRAST Result Date: 07/31/2024 CLINICAL DATA:  Epigastric pain. EXAM: CT ABDOMEN AND PELVIS WITH CONTRAST TECHNIQUE: Multidetector CT imaging of the abdomen and pelvis was performed using the standard protocol following bolus administration of intravenous contrast. RADIATION DOSE REDUCTION: This exam was performed according to the departmental dose-optimization program which includes automated exposure control, adjustment of the mA and/or kV according to patient size and/or use of iterative reconstruction technique. CONTRAST:  75mL OMNIPAQUE  IOHEXOL  350  MG/ML SOLN COMPARISON:  CT abdomen pelvis dated 03/01/2024. FINDINGS: Lower chest: No acute abnormality. No intra-abdominal free air or free fluid. Hepatobiliary: The liver is unremarkable. There is mild dilatation, post cholecystectomy. The common bile duct measures approximately 13 mm in diameter, similar or minimally increased in size since the prior CT. No retained calcified stone noted in the CBD. Faint slightly higher density content noted in the central CBD (25/2) which may represent debris. MRCP may provide better evaluation if clinically indicated. There is mild pneumobilia. Pancreas: Unremarkable. No pancreatic ductal dilatation or surrounding inflammatory changes. Spleen: Normal in size without focal abnormality. Adrenals/Urinary Tract: The adrenal glands are unremarkable. There is a 1 cm partially exophytic lesion from the lateral upper pole of the left kidney which is not characterized on this CT but similar to prior CT.  This was better evaluated on the MRI of 04/17/2024. There is no hydronephrosis on either side. There is symmetric enhancement and excretion of contrast by both kidneys. The visualized ureters and urinary bladder appear unremarkable. Stomach/Bowel: Small hiatal hernia. Severe sigmoid diverticulosis. Mild linear perisigmoid stranding, likely chronic and scarring. There is moderate stool throughout the colon. There is no bowel obstruction or active inflammation. Appendectomy. Vascular/Lymphatic: Mild aortoiliac atherosclerotic disease. The IVC is unremarkable. No portal venous gas. There is no adenopathy. Reproductive: Hysterectomy.  No suspicious adnexal masses. Other: None Musculoskeletal: Osteopenia with degenerative changes. No acute osseous pathology. IMPRESSION: 1. No acute intra-abdominal or pelvic pathology. 2. Severe sigmoid diverticulosis. No bowel obstruction. 3.  Aortic Atherosclerosis (ICD10-I70.0). Electronically Signed   By: Vanetta Chou M.D.   On: 07/31/2024 18:05    US  Abdomen Limited RUQ (LIVER/GB) Result Date: 07/31/2024 CLINICAL DATA:  One day history of epigastric pain EXAM: ULTRASOUND ABDOMEN LIMITED RIGHT UPPER QUADRANT COMPARISON:  MRI abdomen dated 04/17/2024 FINDINGS: Gallbladder: Cholecystectomy. Common bile duct: Diameter: 9 mm at the hilum and up to 13 mm in the downstream portion, unchanged from 04/17/2024 Liver: No focal lesion identified. Within normal limits in parenchymal echogenicity. Portal vein is patent on color Doppler imaging with normal direction of blood flow towards the liver. Other: None. IMPRESSION: Unchanged dilation of the common bile duct, which may be related to postcholecystectomy state. No intrahepatic bile duct dilation. Electronically Signed   By: Limin  Xu M.D.   On: 07/31/2024 16:46   DG Chest 1 View Result Date: 07/31/2024 CLINICAL DATA:  Mid abdominal pain EXAM: CHEST  1 VIEW COMPARISON:  Dated 02/03/2024 FINDINGS: Normal lung volumes. No focal consolidations. No pleural effusion or pneumothorax. The heart size and mediastinal contours are within normal limits. No acute osseous abnormality. IMPRESSION: No active disease. Electronically Signed   By: Limin  Xu M.D.   On: 07/31/2024 16:43     Procedures   Medications Ordered in the ED  iohexol  (OMNIPAQUE ) 350 MG/ML injection 75 mL (75 mLs Intravenous Contrast Given 07/31/24 1720)                                    Medical Decision Making Patient presents today with 1 day of acute upper abdominal pain.  By the time of initial evaluation her abdominal pain had subsided and she was complaining more of pressure.  She states that her initial symptoms were similar to when she had had gallstones in the past.  She has a history of cholecystectomy and also of choledocholithiasis postcholecystectomy.  Differential includes choledocholithiasis, cholangitis, reflux, Crohn's flare, gastric ulcer.  CT showed no retained stones in the CBD.  Ultrasound confirmed the same.  She has no  leukocytosis to suggest a cholangitis picture.  She has no jaundice or scleral icterus, along with no LFT abnormalities to suggest an obstructive picture.  I think the most likely explanation is that she had a gallstone that was passing through her CBD that is no longer able to be visualized on CT scan.  Patient's pain was gone at the time of evaluation and she was complaining about a pressure in her abdomen that she believed to be reflux.  She had no tenderness on physical exam. EKG showed sinus rhythm.  She states that the pressure feels similar to when she has had reflux in the past.  She has a known hiatal hernia.  She has medications at home  that she would like to manage her reflux with and does not need any medications from us  per her request.  Overall she feels safe to discharge home and will need close follow-up with GI.  Other than hypertension, her vitals were within normal limits.  She will need to follow-up with her PCP for high blood pressure.  Amount and/or Complexity of Data Reviewed Labs: ordered.    Details: Lipase, LFTs, CBC with no abnormalities Radiology: ordered.    Details: CT scan abdomen, see report ECG/medicine tests: ordered.    Details: EKG showed normal sinus rhythm  Risk OTC drugs.      Final diagnoses:  Epigastric pain    ED Discharge Orders     None          Napoleon Limes, MD 07/31/24 2209    Elnor Jayson LABOR, DO 08/02/24 1849  "

## 2024-08-02 ENCOUNTER — Emergency Department (HOSPITAL_COMMUNITY)

## 2024-08-02 ENCOUNTER — Encounter (HOSPITAL_COMMUNITY): Payer: Self-pay | Admitting: Emergency Medicine

## 2024-08-02 ENCOUNTER — Other Ambulatory Visit: Payer: Self-pay

## 2024-08-02 ENCOUNTER — Inpatient Hospital Stay (HOSPITAL_COMMUNITY)
Admission: EM | Admit: 2024-08-02 | Discharge: 2024-08-04 | DRG: 446 | Disposition: A | Attending: Internal Medicine | Admitting: Internal Medicine

## 2024-08-02 DIAGNOSIS — Z8419 Family history of other disorders of kidney and ureter: Secondary | ICD-10-CM

## 2024-08-02 DIAGNOSIS — I10 Essential (primary) hypertension: Secondary | ICD-10-CM | POA: Diagnosis present

## 2024-08-02 DIAGNOSIS — F32A Depression, unspecified: Secondary | ICD-10-CM | POA: Diagnosis present

## 2024-08-02 DIAGNOSIS — K8041 Calculus of bile duct with cholecystitis, unspecified, with obstruction: Principal | ICD-10-CM | POA: Diagnosis present

## 2024-08-02 DIAGNOSIS — K449 Diaphragmatic hernia without obstruction or gangrene: Secondary | ICD-10-CM | POA: Diagnosis present

## 2024-08-02 DIAGNOSIS — Z9049 Acquired absence of other specified parts of digestive tract: Secondary | ICD-10-CM

## 2024-08-02 DIAGNOSIS — Z8249 Family history of ischemic heart disease and other diseases of the circulatory system: Secondary | ICD-10-CM

## 2024-08-02 DIAGNOSIS — K838 Other specified diseases of biliary tract: Secondary | ICD-10-CM

## 2024-08-02 DIAGNOSIS — Z9889 Other specified postprocedural states: Secondary | ICD-10-CM

## 2024-08-02 DIAGNOSIS — F329 Major depressive disorder, single episode, unspecified: Secondary | ICD-10-CM | POA: Diagnosis present

## 2024-08-02 DIAGNOSIS — Z79899 Other long term (current) drug therapy: Secondary | ICD-10-CM

## 2024-08-02 DIAGNOSIS — R7989 Other specified abnormal findings of blood chemistry: Secondary | ICD-10-CM | POA: Diagnosis present

## 2024-08-02 DIAGNOSIS — R1011 Right upper quadrant pain: Principal | ICD-10-CM

## 2024-08-02 DIAGNOSIS — Z881 Allergy status to other antibiotic agents status: Secondary | ICD-10-CM

## 2024-08-02 DIAGNOSIS — Z888 Allergy status to other drugs, medicaments and biological substances status: Secondary | ICD-10-CM

## 2024-08-02 DIAGNOSIS — Z820 Family history of epilepsy and other diseases of the nervous system: Secondary | ICD-10-CM

## 2024-08-02 DIAGNOSIS — K5090 Crohn's disease, unspecified, without complications: Secondary | ICD-10-CM | POA: Diagnosis present

## 2024-08-02 DIAGNOSIS — K831 Obstruction of bile duct: Secondary | ICD-10-CM | POA: Diagnosis present

## 2024-08-02 DIAGNOSIS — F419 Anxiety disorder, unspecified: Secondary | ICD-10-CM | POA: Diagnosis present

## 2024-08-02 DIAGNOSIS — K219 Gastro-esophageal reflux disease without esophagitis: Secondary | ICD-10-CM | POA: Diagnosis present

## 2024-08-02 DIAGNOSIS — N2889 Other specified disorders of kidney and ureter: Secondary | ICD-10-CM | POA: Diagnosis present

## 2024-08-02 LAB — CBC WITH DIFFERENTIAL/PLATELET
Abs Immature Granulocytes: 0.02 10*3/uL (ref 0.00–0.07)
Basophils Absolute: 0 10*3/uL (ref 0.0–0.1)
Basophils Relative: 0 %
Eosinophils Absolute: 0 10*3/uL (ref 0.0–0.5)
Eosinophils Relative: 0 %
HCT: 42.4 % (ref 36.0–46.0)
Hemoglobin: 13.9 g/dL (ref 12.0–15.0)
Immature Granulocytes: 0 %
Lymphocytes Relative: 7 %
Lymphs Abs: 0.5 10*3/uL — ABNORMAL LOW (ref 0.7–4.0)
MCH: 29.1 pg (ref 26.0–34.0)
MCHC: 32.8 g/dL (ref 30.0–36.0)
MCV: 88.9 fL (ref 80.0–100.0)
Monocytes Absolute: 0.1 10*3/uL (ref 0.1–1.0)
Monocytes Relative: 2 %
Neutro Abs: 7.2 10*3/uL (ref 1.7–7.7)
Neutrophils Relative %: 91 %
Platelets: 181 10*3/uL (ref 150–400)
RBC: 4.77 MIL/uL (ref 3.87–5.11)
RDW: 13.2 % (ref 11.5–15.5)
WBC: 7.9 10*3/uL (ref 4.0–10.5)
nRBC: 0 % (ref 0.0–0.2)

## 2024-08-02 LAB — COMPREHENSIVE METABOLIC PANEL WITH GFR
ALT: 154 U/L — ABNORMAL HIGH (ref 0–44)
AST: 192 U/L — ABNORMAL HIGH (ref 15–41)
Albumin: 4.2 g/dL (ref 3.5–5.0)
Alkaline Phosphatase: 137 U/L — ABNORMAL HIGH (ref 38–126)
Anion gap: 14 (ref 5–15)
BUN: 17 mg/dL (ref 8–23)
CO2: 26 mmol/L (ref 22–32)
Calcium: 9.3 mg/dL (ref 8.9–10.3)
Chloride: 104 mmol/L (ref 98–111)
Creatinine, Ser: 0.87 mg/dL (ref 0.44–1.00)
GFR, Estimated: >60 mL/min
Glucose, Bld: 140 mg/dL — ABNORMAL HIGH (ref 70–99)
Potassium: 3.9 mmol/L (ref 3.5–5.1)
Sodium: 144 mmol/L (ref 135–145)
Total Bilirubin: 1.2 mg/dL (ref 0.0–1.2)
Total Protein: 6.9 g/dL (ref 6.5–8.1)

## 2024-08-02 LAB — URINALYSIS, ROUTINE W REFLEX MICROSCOPIC
Bilirubin Urine: NEGATIVE
Glucose, UA: NEGATIVE mg/dL
Hgb urine dipstick: NEGATIVE
Ketones, ur: NEGATIVE mg/dL
Leukocytes,Ua: NEGATIVE
Nitrite: NEGATIVE
Protein, ur: NEGATIVE mg/dL
Specific Gravity, Urine: 1.023 (ref 1.005–1.030)
pH: 7 (ref 5.0–8.0)

## 2024-08-02 LAB — TROPONIN T, HIGH SENSITIVITY
Troponin T High Sensitivity: 8 ng/L (ref 0–19)
Troponin T High Sensitivity: 8 ng/L (ref 0–19)

## 2024-08-02 LAB — LIPASE, BLOOD: Lipase: 72 U/L — ABNORMAL HIGH (ref 11–51)

## 2024-08-02 MED ORDER — LACTATED RINGERS IV BOLUS
500.0000 mL | Freq: Once | INTRAVENOUS | Status: AC
  Administered 2024-08-02: 500 mL via INTRAVENOUS

## 2024-08-02 MED ORDER — ACETAMINOPHEN 650 MG RE SUPP: 650.0000 mg | Freq: Four times a day (QID) | RECTAL | Status: DC | PRN

## 2024-08-02 MED ORDER — ONDANSETRON HCL 4 MG/2ML IJ SOLN
4.0000 mg | Freq: Once | INTRAMUSCULAR | Status: AC
  Administered 2024-08-02: 4 mg via INTRAVENOUS
  Filled 2024-08-02: qty 2

## 2024-08-02 MED ORDER — LACTATED RINGERS IV SOLN: INTRAVENOUS | Status: AC

## 2024-08-02 MED ORDER — POLYETHYLENE GLYCOL 3350 17 G PO PACK: 17.0000 g | PACK | Freq: Every day | ORAL | Status: DC | PRN

## 2024-08-02 MED ORDER — MORPHINE SULFATE (PF) 2 MG/ML IV SOLN: 2.0000 mg | INTRAVENOUS | Status: DC | PRN

## 2024-08-02 MED ORDER — MORPHINE SULFATE (PF) 4 MG/ML IV SOLN
4.0000 mg | Freq: Once | INTRAVENOUS | Status: AC
  Administered 2024-08-02: 4 mg via INTRAVENOUS
  Filled 2024-08-02: qty 1

## 2024-08-02 MED ORDER — ACETAMINOPHEN 325 MG PO TABS
650.0000 mg | ORAL_TABLET | Freq: Four times a day (QID) | ORAL | Status: DC | PRN
  Administered 2024-08-04: 650 mg via ORAL
  Filled 2024-08-02: qty 2

## 2024-08-02 MED ORDER — IOHEXOL 300 MG/ML  SOLN
100.0000 mL | Freq: Once | INTRAMUSCULAR | Status: AC | PRN
  Administered 2024-08-02: 100 mL via INTRAVENOUS

## 2024-08-02 NOTE — H&P (Signed)
 " History and Physical    Annette Briggs FMW:[id number] DOB: 12-03-1940 DOA: 08/02/2024  PCP: Annette Norleen PEDLAR, MD   Patient coming from: Home  I have personally briefly reviewed patient's old medical records in [facility]  Chief Complaint: Abdominal pain.  HPI: Annette Briggs is a 84 y.o. female with medical history significant for Crohn's disease, hypertension, depression, left renal mass. Patient presented to the ED with complaints of Crohn's disease, hypertension, depression, left renal mass. Patient presented to the ED with complaints of upper abdominal pain that started about 10:30 AM today after she ate breakfast, she reports nausea but no vomiting.  Last bowel movement was yesterday.  No urinary symptoms. She was in the ED 1/29 with similar abdominal pain, CT abdomen and pelvis with contrast was negative.  Her abdominal pain improved so she was discharged from the ED.  Recent hospitalization at [facility] 11/7 to 11/8 for choledocholithiasis, underwent ERCP 11/8 with balloon sphincteroplasty , 12 mm stone seen and removed, debris/sludge also noted.  ED Course: Temperature is 98.4.  Heart rate 76-119.  Respirate rate 16-23.  Blood pressure systolic 124-177.  O2 sats 90 to 97% on room air. Elevated liver enzymes, AST 192, ALT 154, ALP 137, T. bili 1.2.  Lipase 72. CT abdomen pelvis with contrast-shows suspected debris in the downstream common bile duct, progressive biliary dilatation now measuring 16 mm in diameter. EDP talked to GI Dr. Shaaron, recommended admission to [facility] for ERCP. [place] [facility] eval on-call-Dr. Wilhelmenia, agreed with [name] admission, will see in consult.  Review of Systems: As per HPI all other systems reviewed and negative.  Past Medical History:  Diagnosis Date   Anxiety    Choledocholithiasis    Crohn's ileocolitis (HCC)    Depression    Essential hypertension    GERD (gastroesophageal reflux disease)     Past Surgical History:  Procedure  Laterality Date   APPENDECTOMY     BLADDER SUSPENSION     CHOLECYSTECTOMY     COLONOSCOPY     2008   COLONOSCOPY N/A 02/13/2013   Procedure: COLONOSCOPY;  Surgeon: Annette RAYMOND Rivet, MD;  Location: [facility];  Service: Endoscopy;  Laterality: N/A;  730   COLONOSCOPY N/A 01/15/2019   Procedure: COLONOSCOPY;  Surgeon: Briggs Annette RAYMOND, MD;  Location: [facility];  Service: Endoscopy;  Laterality: N/A;  100   ERCP N/A 02/06/2024   Procedure: ERCP, WITH INTERVENTION IF INDICATED;  Surgeon: Annette Norleen SAILOR, MD;  Location: [facility] ENDOSCOPY;  Service: Gastroenterology;  Laterality: N/A;   ERCP N/A 05/10/2024   Procedure: ERCP, WITH INTERVENTION IF INDICATED;  Surgeon: Annette Lupita BRAVO, MD;  Location: [facility] ENDOSCOPY;  Service: Gastroenterology;  Laterality: N/A;   ESOPHAGOGASTRODUODENOSCOPY N/A 03/18/2020   Procedure: ESOPHAGOGASTRODUODENOSCOPY (EGD);  Surgeon: Briggs Annette RAYMOND, MD;  Location: [facility];  Service: Endoscopy;  Laterality: N/A;  225, moved up per office   LEFT HEART CATH AND CORONARY ANGIOGRAPHY N/A 02/02/2020   Procedure: LEFT HEART CATH AND CORONARY ANGIOGRAPHY;  Surgeon: Annette Sharper, MD;  Location: [facility] INVASIVE CV LAB;  Service: Cardiovascular;  Laterality: N/A;   TOTAL ABDOMINAL HYSTERECTOMY       reports that she has never smoked. She has never been exposed to tobacco smoke. She has never used smokeless tobacco. She reports that she does not drink alcohol  and does not use drugs.  Allergies[1]  Family History  Problem Relation Age of Onset   Heart failure Mother  Kidney failure Father    Breast cancer Sister    Alzheimer's disease Sister     Prior to Admission medications  Medication Sig Start Date End Date Taking? Authorizing Provider  alum & mag hydroxide-simeth (MAALOX/MYLANTA) 200-200-20 MG/5ML suspension Take 15 mLs by mouth every 6 (six) hours as needed for indigestion, heartburn or flatulence. 02/07/24   Cheryle Page, MD  Cholecalciferol (DIALYVITE VITAMIN D  5000)  125 MCG (5000 UT) capsule Take 5,000 Units by mouth daily.    [provider]  Loperamide HCl (IMODIUM PO) Take 2-4 mg by mouth every 4 (four) hours as needed (Diarrhea).    [provider]  loratadine  (CLARITIN ) 10 MG tablet Take 10 mg by mouth daily.    [provider]  metoprolol  succinate (TOPROL -XL) 25 MG 24 hr tablet Take 25 mg by mouth at bedtime.    [provider]  Multiple Vitamin (MULTIVITAMIN) tablet Take 1 tablet by mouth daily.    [provider]  ondansetron  (ZOFRAN ) 4 MG tablet Take 1 tablet (4 mg total) by mouth every 6 (six) hours as needed for nausea. 02/07/24   Cheryle Page, MD  pantoprazole  (PROTONIX ) 40 MG tablet Take 1 tablet (40 mg total) by mouth daily. 02/03/24 04/08/24  Cleotilde Rogue, MD  psyllium (METAMUCIL SMOOTH TEXTURE) 58.6 % powder Take 1 packet by mouth at bedtime. Patient taking differently: Take 1 packet by mouth at bedtime. Patient eats the fiber cookies when she remembers to take the fiber. 06/28/21   Rehman, Annette PENNER, MD  traMADol  (ULTRAM ) 50 MG tablet Take 1 tablet (50 mg total) by mouth every 6 (six) hours as needed for severe pain (pain score 7-10). 02/07/24   Cheryle Page, MD  ursodiol  (ACTIGALL ) 300 MG capsule Take 1 capsule (300 mg total) by mouth 2 (two) times daily. Patient not taking: Reported on 04/08/2024 03/12/24   Annette, Chelsea L, NP  Ustekinumab -auub (WEZLANA ) 90 MG/ML SOSY Inject 90 mg into the skin every 6 (six) weeks. 12/31/23   Annette Briggs, Daniel, MD  venlafaxine  XR (EFFEXOR -XR) 37.5 MG 24 hr capsule Take 37.5 mg by mouth daily.    [provider]    Physical Exam: Vitals:   08/02/24 1258 08/02/24 1303 08/02/24 1305 08/02/24 1330  BP:  (!) 177/52  (!) 146/53  Pulse:  76  (!) 54  Resp:   16 (!) 23  Temp:  98.4 F (36.9 C)    SpO2:  96%  97%  Weight: 58 kg     Height: 5' 1 (1.549 m)       Constitutional: NAD, calm, comfortable Vitals:   08/02/24 1258 08/02/24 1303 08/02/24  1305 08/02/24 1330  BP:  (!) 177/52  (!) 146/53  Pulse:  76  (!) 54  Resp:   16 (!) 23  Temp:  98.4 F (36.9 C)    SpO2:  96%  97%  Weight: 58 kg     Height: 5' 1 (1.549 m)      Eyes: PERRL, lids and conjunctivae normal ENMT: Mucous membranes are dry   Neck: normal, supple, no masses, no thyromegaly Respiratory: clear to auscultation bilaterally, no wheezing, no crackles. Normal respiratory effort. No accessory muscle use.  Cardiovascular: Regular rate and rhythm, no murmurs / rubs / gallops. No extremity edema.  Extremities warm. Abdomen: no tenderness, no masses palpated. No hepatosplenomegaly.   Musculoskeletal: no clubbing / cyanosis. No joint deformity upper and lower extremities.  Skin: no rashes, lesions, ulcers. No induration Neurologic: No facial asymmetry, moving  extremities spontaneously, speech fluent. Psychiatric: Normal judgment and insight. Alert and oriented x 3. Normal mood.   Labs on Admission: I have personally reviewed following labs and imaging studies  CBC: Recent Labs  Lab 07/31/24 1511 08/02/24 1345  WBC 5.7 7.9  NEUTROABS 3.8 7.2  HGB 13.9 13.9  HCT 40.4 42.4  MCV 87.4 88.9  PLT 206 181   Basic Metabolic Panel: Recent Labs  Lab 07/31/24 1511 08/02/24 1345  NA 140 144  K 4.2 3.9  CL 102 104  CO2 27 26  GLUCOSE 90 140*  BUN 18 17  CREATININE 0.93 0.87  CALCIUM 9.6 9.3   GFR: Estimated Creatinine Clearance: 38.7 mL/min (by C-G formula based on SCr of 0.87 mg/dL). Liver Function Tests: Recent Labs  Lab 07/31/24 1511 08/02/24 1345  AST 31 192*  ALT 22 154*  ALKPHOS 90 137*  BILITOT 0.4 1.2  PROT 7.2 6.9  ALBUMIN 4.3 4.2   Recent Labs  Lab 07/31/24 1511 08/02/24 1345  LIPASE 46 72*   Urine analysis:    Component Value Date/Time   COLORURINE YELLOW 08/02/2024 1337   APPEARANCEUR CLEAR 08/02/2024 1337   LABSPEC 1.023 08/02/2024 1337   PHURINE 7.0 08/02/2024 1337   GLUCOSEU NEGATIVE 08/02/2024 1337   HGBUR NEGATIVE  08/02/2024 1337   BILIRUBINUR NEGATIVE 08/02/2024 1337   KETONESUR NEGATIVE 08/02/2024 1337   PROTEINUR NEGATIVE 08/02/2024 1337   NITRITE NEGATIVE 08/02/2024 1337   LEUKOCYTESUR NEGATIVE 08/02/2024 1337    Radiological Exams on Admission: CT ABDOMEN PELVIS W CONTRAST Result Date: 08/02/2024 CLINICAL DATA:  Upper abdominal pain since this morning, nausea without vomiting EXAM: CT ABDOMEN AND PELVIS WITH CONTRAST TECHNIQUE: Multidetector CT imaging of the abdomen and pelvis was performed using the standard protocol following bolus administration of intravenous contrast. RADIATION DOSE REDUCTION: This exam was performed according to the departmental dose-optimization program which includes automated exposure control, adjustment of the mA and/or kV according to patient size and/or use of iterative reconstruction technique. CONTRAST:  OMNIPAQUE  IOHEXOL  300 MG/ML  SOLN COMPARISON:  07/31/2024 FINDINGS: Lower chest: No acute pleural or parenchymal lung disease. Dependent hypoventilatory changes. Hepatobiliary: Prior cholecystectomy. Slightly progressive biliary dilatation, with common bile duct measuring up to 16 mm. Pneumobilia consistent with prior sphincterotomy. There may be some debris within the downstream common bile duct near the ampulla reference image 35 and 36 of series 4, compatible with choledocholithiasis. Liver is otherwise unremarkable. Pancreas: Unremarkable. No pancreatic ductal dilatation or surrounding inflammatory changes. Spleen: Normal in size without focal abnormality. Adrenals/Urinary Tract: Stable heterogeneous lesion within the superolateral left kidney measuring 1.1 cm reference image 35, previously characterized as concerning for renal cell carcinoma on MRI. Right kidney is unremarkable. The adrenals and bladder are grossly normal. Stomach/Bowel: No bowel obstruction or ileus. Prior appendectomy. Colonic diverticulosis without diverticulitis. No bowel wall thickening or  inflammatory change. Vascular/Lymphatic: Aortic atherosclerosis. No enlarged abdominal or pelvic lymph nodes. Reproductive: Status post hysterectomy. No adnexal masses. Other: No free fluid or free intraperitoneal gas. No abdominal wall hernia. Musculoskeletal: No acute or destructive bony abnormalities. Reconstructed images demonstrate no additional findings. IMPRESSION: 1. Suspected debris within the downstream common bowel duct, compatible with the patient's known history of choledocholithiasis. Correlation with ERCP may be useful. 2. Progressive biliary dilation, with common bile duct now measuring 16 mm in maximal diameter. 3. Colonic diverticulosis without diverticulitis. 4. Stable 1.1 cm left renal lesion concerning for renal cell carcinoma based on previous imaging characteristics. 5.  Aortic Atherosclerosis (ICD10-I70.0). Electronically Signed  By: Ozell Daring M.D.   On: 08/02/2024 15:43   CT ABDOMEN PELVIS W CONTRAST Result Date: 07/31/2024 CLINICAL DATA:  Epigastric pain. EXAM: CT ABDOMEN AND PELVIS WITH CONTRAST TECHNIQUE: Multidetector CT imaging of the abdomen and pelvis was performed using the standard protocol following bolus administration of intravenous contrast. RADIATION DOSE REDUCTION: This exam was performed according to the departmental dose-optimization program which includes automated exposure control, adjustment of the mA and/or kV according to patient size and/or use of iterative reconstruction technique. CONTRAST:  75mL OMNIPAQUE  IOHEXOL  350 MG/ML SOLN COMPARISON:  CT abdomen pelvis dated 03/01/2024. FINDINGS: Lower chest: No acute abnormality. No intra-abdominal free air or free fluid. Hepatobiliary: The liver is unremarkable. There is mild dilatation, post cholecystectomy. The common bile duct measures approximately 13 mm in diameter, similar or minimally increased in size since the prior CT. No retained calcified stone noted in the CBD. Faint slightly higher density content  noted in the central CBD (25/2) which may represent debris. MRCP may provide better evaluation if clinically indicated. There is mild pneumobilia. Pancreas: Unremarkable. No pancreatic ductal dilatation or surrounding inflammatory changes. Spleen: Normal in size without focal abnormality. Adrenals/Urinary Tract: The adrenal glands are unremarkable. There is a 1 cm partially exophytic lesion from the lateral upper pole of the left kidney which is not characterized on this CT but similar to prior CT. This was better evaluated on the MRI of 04/17/2024. There is no hydronephrosis on either side. There is symmetric enhancement and excretion of contrast by both kidneys. The visualized ureters and urinary bladder appear unremarkable. Stomach/Bowel: Small hiatal hernia. Severe sigmoid diverticulosis. Mild linear perisigmoid stranding, likely chronic and scarring. There is moderate stool throughout the colon. There is no bowel obstruction or active inflammation. Appendectomy. Vascular/Lymphatic: Mild aortoiliac atherosclerotic disease. The IVC is unremarkable. No portal venous gas. There is no adenopathy. Reproductive: Hysterectomy.  No suspicious adnexal masses. Other: None Musculoskeletal: Osteopenia with degenerative changes. No acute osseous pathology. IMPRESSION: 1. No acute intra-abdominal or pelvic pathology. 2. Severe sigmoid diverticulosis. No bowel obstruction. 3.  Aortic Atherosclerosis (ICD10-I70.0). Electronically Signed   By: Vanetta Chou M.D.   On: 07/31/2024 18:05   EKG: Independently reviewed.  Sinus tachycardia, rate 108, QTc 452.  Left anterior fascicular block.  Assessment/Plan Principal Problem:   Common bile duct (CBD) obstruction (HCC) Active Problems:   Elevated LFTs   Crohn disease (HCC)   Essential hypertension   Depression   Anxiety and depression   Left renal mass   Assessment and Plan:  CBD obstruction-CT abdomen and pelvis with contrast shows suspected debris within the  downstream common bowel duct, Progressive biliary dilation, with common bile duct now measuring 16 mm in maximal diameter.  History of cholelithiasis and cholecystectomy.  Afebrile, WBC 7.9, she is well-appearing.  Tachycardic heart rate 54-119. Liver enzymes elevated AST 192, ALT 154, ALP 137, T. bili 1.2.  Lipase 72.   - Recent hospitalization 05/2024 also for cholelithiasis, underwent ERCP with sphincteroplasty, 12mm stone removal, debris/sludge also present. - GI on-call Dr. Wilhelmenia- agreed with admission to [facility] for ERCP, n.p.o. midnight, may be able to get patient in for ERCP tomorrow.  Patient voices understanding. - IV morphine  2 mg every 4 hours as needed - 500 mL LR bolus, continue LR 75cc/hr - Held off on antibiotics for now.  Hypertension-elevated. - Pending med reconciliation resume metoprolol  25 mg daily  Depression-  - pending med rec- resume Effexor   Left renal mass- CTAP W contrast  today- stable 1.1  cm left renal lesion concerning for renal cell carcinoma based on previous imaging characteristics.   DVT prophylaxis: SCDS Code Status: FULL Family Communication: None at bedside Disposition Plan: ~ 2 - 3 days Consults called: GI Admission status: Inpt Tele  I certify that at the point of admission it is my clinical judgment that the patient will require inpatient hospital care spanning beyond 2 midnights from the point of admission due to high intensity of service, high risk for further deterioration and high frequency of surveillance required.    Author: Tully FORBES Carwin, MD 08/02/2024 6:43 PM  For on call review [email or web address].     [1]  Allergies Allergen Reactions   Ciprofloxacin  Other (See Comments)    Patient can't remember reaction.   Pneumovax [Pneumococcal Polysaccharide Vaccine] Swelling   Wellbutrin [Bupropion] Anxiety    Anxiety attack   "

## 2024-08-02 NOTE — ED Triage Notes (Signed)
 Pt presents POV with upper abd pain started at 1030 this morning. Nausea without vomiting. Was at [facility] ED yesterday for same.

## 2024-08-02 NOTE — Consult Note (Signed)
 "  Gastroenterology Inpatient Consultation   Attending Requesting Consult Pearlean Tully BRAVO, MD  [facility] Day: 1  Reason for Consult Recurrent abdominal pain, abnormal LFTs, imaging concerning for recurrent choledocholithiasis    History of Present Illness  Annette Briggs is a 84 y.o. female with a pmh significant for hypertension, MDD, anxiety, Crohn's disease (status post ileocecectomy), status post appendectomy, status postcholecystectomy (status post prior ERCPs for choledocholithiasis), GERD, kidney lesion (concerning for possible RCC pending workup still).  The GI service is consulted for evaluation and management of recurrent abdominal pain, abnormal LFTs, concern for recurrent choledocholithiasis on imaging.  The patient was in her usual state of health until the last couple of weeks.  She is having recurring short episodes of abdominal pain and discomfort.  She came in for further evaluation.  Liver biochemical testing was noted to be slightly elevated.  CT scan was performed that showed a progressive ductal dilation with concern for debris within the bile duct.  As she was at [facility], she was referred to have transition/transfer to [facility] for further evaluation and care.  She is seen this evening and is resting comfortably.  She has done well since her last ERCP until these last 2 weeks.  She continues on her biologic therapy for Crohn's disease.  She has been on ursodiol  in an effort of trying to prevent her from having recurrent stones.  GI Review of Systems Positive as above Negative for dysphagia, odynophagia, change in bowel habits, melena, hematochezia   Review of Systems  General: Denies fevers/chills/weight loss unintentionally Cardiovascular: Denies chest pain Pulmonary: Denies shortness of breath Gastroenterological: See HPI Genitourinary: Urine has appeared darker to her in the last 36 hours Hematological: Denies easy  bruising/bleeding Dermatological: Denies jaundice Psychological: Mood is stable but hopeful for improvement   Histories  Past Medical Past Medical History:  Diagnosis Date   Anxiety    Choledocholithiasis    Crohn's ileocolitis (HCC)    Depression    Essential hypertension    GERD (gastroesophageal reflux disease)    Past Surgical Past Surgical History:  Procedure Laterality Date   APPENDECTOMY     BLADDER SUSPENSION     CHOLECYSTECTOMY     COLONOSCOPY     2008   COLONOSCOPY N/A 02/13/2013   Procedure: COLONOSCOPY;  Surgeon: Claudis RAYMOND Rivet, MD;  Location: [facility];  Service: Endoscopy;  Laterality: N/A;  730   COLONOSCOPY N/A 01/15/2019   Procedure: COLONOSCOPY;  Surgeon: Rivet Claudis RAYMOND, MD;  Location: [facility];  Service: Endoscopy;  Laterality: N/A;  100   ERCP N/A 02/06/2024   Procedure: ERCP, WITH INTERVENTION IF INDICATED;  Surgeon: Abran Norleen SAILOR, MD;  Location: [facility] ENDOSCOPY;  Service: Gastroenterology;  Laterality: N/A;   ERCP N/A 05/10/2024   Procedure: ERCP, WITH INTERVENTION IF INDICATED;  Surgeon: Avram Lupita BRAVO, MD;  Location: [facility] ENDOSCOPY;  Service: Gastroenterology;  Laterality: N/A;   ESOPHAGOGASTRODUODENOSCOPY N/A 03/18/2020   Procedure: ESOPHAGOGASTRODUODENOSCOPY (EGD);  Surgeon: Rivet Claudis RAYMOND, MD;  Location: [facility];  Service: Endoscopy;  Laterality: N/A;  225, moved up per office   LEFT HEART CATH AND CORONARY ANGIOGRAPHY N/A 02/02/2020   Procedure: LEFT HEART CATH AND CORONARY ANGIOGRAPHY;  Surgeon: Wonda Sharper, MD;  Location: [facility] INVASIVE CV LAB;  Service: Cardiovascular;  Laterality: N/A;   TOTAL ABDOMINAL HYSTERECTOMY     Allergies Allergies[1] Family Family History  Problem Relation Age of Onset   Heart failure Mother  Kidney failure Father    Breast cancer Sister    Alzheimer's disease Sister     Social Social History   Socioeconomic History   Marital status: Widowed    Spouse name: Not on file   Number of children: Not  on file   Years of education: Not on file   Highest education level: Not on file  Occupational History   Not on file  Tobacco Use   Smoking status: Never    Passive exposure: Never   Smokeless tobacco: Never  Vaping Use   Vaping status: Never Used  Substance and Sexual Activity   Alcohol  use: No    Alcohol /week: 0.0 standard drinks of alcohol    Drug use: No   Sexual activity: Not on file  Other Topics Concern   Not on file  Social History Narrative   Patient is widowed.  She lives with one of her sisters.   Never smoker no alcohol  tobacco or drug use   Social Drivers of Health   Tobacco Use: Low Risk (08/02/2024)   Patient History    Smoking Tobacco Use: Never    Smokeless Tobacco Use: Never    Passive Exposure: Never  Financial Resource Strain: Not on file  Food Insecurity: No Food Insecurity (05/09/2024)   Epic    Worried About Programme Researcher, Broadcasting/film/video in the Last Year: Never true    Ran Out of Food in the Last Year: Never true  Transportation Needs: No Transportation Needs (05/09/2024)   Epic    Lack of Transportation (Medical): No    Lack of Transportation (Non-Medical): No  Physical Activity: Not on file  Stress: Not on file  Social Connections: Moderately Integrated (05/09/2024)   Social Connection and Isolation Panel    Frequency of Communication with Friends and Family: More than three times a week    Frequency of Social Gatherings with Friends and Family: More than three times a week    Attends Religious Services: More than 4 times per year    Active Member of [employer] or Organizations: Yes    Attends Banker Meetings: More than 4 times per year    Marital Status: Widowed  Intimate Partner Violence: Not At Risk (05/09/2024)   Epic    Fear of Current or Ex-Partner: No    Emotionally Abused: No    Physically Abused: No    Sexually Abused: No  Depression (PHQ2-9): Not on file  Alcohol  Screen: Not on file  Housing: Low Risk (05/09/2024)   Epic    Unable  to Pay for Housing in the Last Year: No    Number of Times Moved in the Last Year: 0    Homeless in the Last Year: No  Utilities: Not At Risk (05/09/2024)   Epic    Threatened with loss of utilities: No  Health Literacy: Not on file    Medications  Home Medications Medications Ordered Prior to Encounter[2] Scheduled Inpatient Medications  Continuous Inpatient Infusions  lactated ringers  75 mL/hr at 08/02/24 2208   PRN Inpatient Medications acetaminophen  **OR** acetaminophen , morphine  injection, polyethylene glycol   Physical Examination  BP (!) 120/59 (BP Location: Left Arm)   Pulse (!) 102   Temp 99.2 F (37.3 C)   Resp 17   Ht 5' 1 (1.549 m)   Wt 58 kg   SpO2 92%   BMI 24.16 kg/m  GEN: NAD, appears stated age, doesn't appear chronically ill PSYCH: Cooperative, without pressured speech EYE: Conjunctivae pink, sclerae anicteric ENT:  MMM CV: Nontachycardic RESP: No audible wheezing GI: NABS, soft, mild tenderness to palpation in the midepigastrium, without rebound, volitional guarding is present MSK/EXT: No significant lower extremity edema SKIN: No jaundice NEURO:  Alert & Oriented x 3, no focal deficits   Review of Data  I reviewed the following data at the time of this encounter:  Laboratory Studies   Recent Labs  Lab 08/02/24 1345  NA 144  K 3.9  CL 104  CO2 26  BUN 17  CREATININE 0.87  GLUCOSE 140*  CALCIUM 9.3   Recent Labs  Lab 08/02/24 1345  AST 192*  ALT 154*  ALKPHOS 137*    Recent Labs  Lab 07/31/24 1511 08/02/24 1345  WBC 5.7 7.9  HGB 13.9 13.9  HCT 40.4 42.4  PLT 206 181   No results for input(s): APTT, INR in the last 168 hours.  Imaging Studies  CTAP IMPRESSION: 1. Suspected debris within the downstream common bowel duct, compatible with the patient's known history of choledocholithiasis. Correlation with ERCP may be useful. 2. Progressive biliary dilation, with common bile duct now measuring 16 mm in maximal  diameter. 3. Colonic diverticulosis without diverticulitis. 4. Stable 1.1 cm left renal lesion concerning for renal cell carcinoma based on previous imaging characteristics. 5.  Aortic Atherosclerosis (ICD10-I70.0).  GI Procedures and Studies  November 2025 ERCP - Choledocholithiasis was found. Complete removal was accomplished through a preexisting biliary sphincterotomy.   Assessment  Ms. Mceachron is a 84 y.o. female.  with a pmh significant for hypertension, MDD, anxiety, Crohn's disease (status post ileocecectomy), status post appendectomy, status postcholecystectomy (status post prior ERCPs for choledocholithiasis), GERD, kidney lesion (concerning for possible RCC pending workup still).  The GI service is consulted for evaluation and management of recurrent abdominal pain, abnormal LFTs, concern for recurrent choledocholithiasis on imaging.  The patient is clinically and hemodynamically stable at this moment.  She has evidence on imaging as well as symptomatology concerning for recurrent biliary sludge debris/choledocholithiasis with slight elevation in liver biochemical testing and progressive biliary duct dilation.  She will warrant an ERCP.  The risks of an ERCP were discussed at length, including but not limited to the risk of perforation, bleeding, abdominal pain, post-ERCP pancreatitis (while usually mild can be severe and even life threatening).  Depending on anesthesia availability and my team's availability in the setting of the winter weather, will hopefully be able to perform ERCP on 2/1, if not then will be postponed until Monday unless overt cholangitis develops.  The risks and benefits of endoscopic evaluation/treatment were discussed with the patient and/or family; these include but are not limited to the risk of perforation, infection, bleeding, missed lesions, lack of diagnosis, severe illness requiring hospitalization, as well as anesthesia and sedation related illnesses.  The  patient's history has been reviewed, patient examined, no change in status, and deemed stable for procedure.  The patient and/or family was provided an opportunity to ask questions and all were answered.  The patient and/or family is agreeable to proceed.  All patient questions were answered to the best of my ability, and the patient agrees to the aforementioned plan of action with follow-up as indicated.   Plan/Recommendations  Diet okay for now N.p.o. at midnight INR in a.m. Hopeful for ERCP on 2/1 - If unable to accommodate then will be performed during the week as a result of availability and winter weather May continue ursodiol  for now Strongly recommend medicine service will work on getting this patient a urology  evaluation for the renal lesion noted which has been concerning on multiple imaging studies for possible RCC   Thank you for this consult.  We will continue to follow.  Please page/call with questions or concerns.   Aloha Finner, MD [facility] Office # [phone]     [1]  Allergies Allergen Reactions   Ciprofloxacin  Other (See Comments)    Patient can't remember reaction.   Pneumovax [Pneumococcal Polysaccharide Vaccine] Swelling   Wellbutrin [Bupropion] Anxiety    Anxiety attack  [2]  No current facility-administered medications on file prior to encounter.   Current Outpatient Medications on File Prior to Encounter  Medication Sig Dispense Refill   alum & mag hydroxide-simeth (MAALOX/MYLANTA) 200-200-20 MG/5ML suspension Take 15 mLs by mouth every 6 (six) hours as needed for indigestion, heartburn or flatulence. 355 mL 0   Cholecalciferol (DIALYVITE VITAMIN D  5000) 125 MCG (5000 UT) capsule Take 5,000 Units by mouth daily.     Loperamide HCl (IMODIUM PO) Take 2-4 mg by mouth every 4 (four) hours as needed (Diarrhea).     loratadine  (CLARITIN ) 10 MG tablet Take 10 mg by mouth daily.     metoprolol  succinate (TOPROL -XL) 25  MG 24 hr tablet Take 25 mg by mouth at bedtime.     Multiple Vitamin (MULTIVITAMIN) tablet Take 1 tablet by mouth daily.     ondansetron  (ZOFRAN ) 4 MG tablet Take 1 tablet (4 mg total) by mouth every 6 (six) hours as needed for nausea. 20 tablet 0   pantoprazole  (PROTONIX ) 40 MG tablet Take 1 tablet (40 mg total) by mouth daily. 30 tablet 1   psyllium (METAMUCIL SMOOTH TEXTURE) 58.6 % powder Take 1 packet by mouth at bedtime. (Patient taking differently: Take 1 packet by mouth at bedtime. Patient eats the fiber cookies when she remembers to take the fiber.)     traMADol  (ULTRAM ) 50 MG tablet Take 1 tablet (50 mg total) by mouth every 6 (six) hours as needed for severe pain (pain score 7-10). 14 tablet 0   ursodiol  (ACTIGALL ) 300 MG capsule Take 1 capsule (300 mg total) by mouth 2 (two) times daily. (Patient not taking: Reported on 04/08/2024) 180 capsule 3   Ustekinumab -auub (WEZLANA ) 90 MG/ML SOSY Inject 90 mg into the skin every 6 (six) weeks. 1 mL 9   venlafaxine  XR (EFFEXOR -XR) 37.5 MG 24 hr capsule Take 37.5 mg by mouth daily.     "

## 2024-08-02 NOTE — H&P (View-Only) (Signed)
 "  Gastroenterology Inpatient Consultation   Attending Requesting Consult Pearlean Tully BRAVO, MD  [facility] Day: 1  Reason for Consult Recurrent abdominal pain, abnormal LFTs, imaging concerning for recurrent choledocholithiasis    History of Present Illness  Annette Briggs is a 84 y.o. female with a pmh significant for hypertension, MDD, anxiety, Crohn's disease (status post ileocecectomy), status post appendectomy, status postcholecystectomy (status post prior ERCPs for choledocholithiasis), GERD, kidney lesion (concerning for possible RCC pending workup still).  The GI service is consulted for evaluation and management of recurrent abdominal pain, abnormal LFTs, concern for recurrent choledocholithiasis on imaging.  The patient was in her usual state of health until the last couple of weeks.  She is having recurring short episodes of abdominal pain and discomfort.  She came in for further evaluation.  Liver biochemical testing was noted to be slightly elevated.  CT scan was performed that showed a progressive ductal dilation with concern for debris within the bile duct.  As she was at [facility], she was referred to have transition/transfer to [facility] for further evaluation and care.  She is seen this evening and is resting comfortably.  She has done well since her last ERCP until these last 2 weeks.  She continues on her biologic therapy for Crohn's disease.  She has been on ursodiol  in an effort of trying to prevent her from having recurrent stones.  GI Review of Systems Positive as above Negative for dysphagia, odynophagia, change in bowel habits, melena, hematochezia   Review of Systems  General: Denies fevers/chills/weight loss unintentionally Cardiovascular: Denies chest pain Pulmonary: Denies shortness of breath Gastroenterological: See HPI Genitourinary: Urine has appeared darker to her in the last 36 hours Hematological: Denies easy  bruising/bleeding Dermatological: Denies jaundice Psychological: Mood is stable but hopeful for improvement   Histories  Past Medical Past Medical History:  Diagnosis Date   Anxiety    Choledocholithiasis    Crohn's ileocolitis (HCC)    Depression    Essential hypertension    GERD (gastroesophageal reflux disease)    Past Surgical Past Surgical History:  Procedure Laterality Date   APPENDECTOMY     BLADDER SUSPENSION     CHOLECYSTECTOMY     COLONOSCOPY     2008   COLONOSCOPY N/A 02/13/2013   Procedure: COLONOSCOPY;  Surgeon: Claudis RAYMOND Rivet, MD;  Location: [facility] ENDO [facility];  Service: Endoscopy;  Laterality: N/A;  730   COLONOSCOPY N/A 01/15/2019   Procedure: COLONOSCOPY;  Surgeon: Rivet Claudis RAYMOND, MD;  Location: [facility] ENDO SUITE;  Service: Endoscopy;  Laterality: N/A;  100   ERCP N/A 02/06/2024   Procedure: ERCP, WITH INTERVENTION IF INDICATED;  Surgeon: Abran Norleen SAILOR, MD;  Location: [facility] ENDOSCOPY;  Service: Gastroenterology;  Laterality: N/A;   ERCP N/A 05/10/2024   Procedure: ERCP, WITH INTERVENTION IF INDICATED;  Surgeon: Avram Lupita BRAVO, MD;  Location: [facility] ENDOSCOPY;  Service: Gastroenterology;  Laterality: N/A;   ESOPHAGOGASTRODUODENOSCOPY N/A 03/18/2020   Procedure: ESOPHAGOGASTRODUODENOSCOPY (EGD);  Surgeon: Rivet Claudis RAYMOND, MD;  Location: [facility];  Service: Endoscopy;  Laterality: N/A;  225, moved up per office   LEFT HEART CATH AND CORONARY ANGIOGRAPHY N/A 02/02/2020   Procedure: LEFT HEART CATH AND CORONARY ANGIOGRAPHY;  Surgeon: Wonda Sharper, MD;  Location: [facility] INVASIVE CV LAB;  Service: Cardiovascular;  Laterality: N/A;   TOTAL ABDOMINAL HYSTERECTOMY     Allergies Allergies[1] Family Family History  Problem Relation Age of Onset   Heart failure Mother  Kidney failure Father    Breast cancer Sister    Alzheimer's disease Sister     Social Social History   Socioeconomic History   Marital status: Widowed    Spouse name: Not on file   Number of children: Not  on file   Years of education: Not on file   Highest education level: Not on file  Occupational History   Not on file  Tobacco Use   Smoking status: Never    Passive exposure: Never   Smokeless tobacco: Never  Vaping Use   Vaping status: Never Used  Substance and Sexual Activity   Alcohol  use: No    Alcohol /week: 0.0 standard drinks of alcohol    Drug use: No   Sexual activity: Not on file  Other Topics Concern   Not on file  Social History Narrative   Patient is widowed.  She lives with one of her sisters.   Never smoker no alcohol  tobacco or drug use   Social Drivers of Health   Tobacco Use: Low Risk (08/02/2024)   Patient History    Smoking Tobacco Use: Never    Smokeless Tobacco Use: Never    Passive Exposure: Never  Financial Resource Strain: Not on file  Food Insecurity: No Food Insecurity (05/09/2024)   Epic    Worried About Programme Researcher, Broadcasting/film/video in the Last Year: Never true    Ran Out of Food in the Last Year: Never true  Transportation Needs: No Transportation Needs (05/09/2024)   Epic    Lack of Transportation (Medical): No    Lack of Transportation (Non-Medical): No  Physical Activity: Not on file  Stress: Not on file  Social Connections: Moderately Integrated (05/09/2024)   Social Connection and Isolation Panel    Frequency of Communication with Friends and Family: More than three times a week    Frequency of Social Gatherings with Friends and Family: More than three times a week    Attends Religious Services: More than 4 times per year    Active Member of [employer] or Organizations: Yes    Attends Banker Meetings: More than 4 times per year    Marital Status: Widowed  Intimate Partner Violence: Not At Risk (05/09/2024)   Epic    Fear of Current or Ex-Partner: No    Emotionally Abused: No    Physically Abused: No    Sexually Abused: No  Depression (PHQ2-9): Not on file  Alcohol  Screen: Not on file  Housing: Low Risk (05/09/2024)   Epic    Unable  to Pay for Housing in the Last Year: No    Number of Times Moved in the Last Year: 0    Homeless in the Last Year: No  Utilities: Not At Risk (05/09/2024)   Epic    Threatened with loss of utilities: No  Health Literacy: Not on file    Medications  Home Medications Medications Ordered Prior to Encounter[2] Scheduled Inpatient Medications  Continuous Inpatient Infusions  lactated ringers  75 mL/hr at 08/02/24 2208   PRN Inpatient Medications acetaminophen  **OR** acetaminophen , morphine  injection, polyethylene glycol   Physical Examination  BP (!) 120/59 (BP Location: Left Arm)   Pulse (!) 102   Temp 99.2 F (37.3 C)   Resp 17   Ht 5' 1 (1.549 m)   Wt 58 kg   SpO2 92%   BMI 24.16 kg/m  GEN: NAD, appears stated age, doesn't appear chronically ill PSYCH: Cooperative, without pressured speech EYE: Conjunctivae pink, sclerae anicteric ENT:  MMM CV: Nontachycardic RESP: No audible wheezing GI: NABS, soft, mild tenderness to palpation in the midepigastrium, without rebound, volitional guarding is present MSK/EXT: No significant lower extremity edema SKIN: No jaundice NEURO:  Alert & Oriented x 3, no focal deficits   Review of Data  I reviewed the following data at the time of this encounter:  Laboratory Studies   Recent Labs  Lab 08/02/24 1345  NA 144  K 3.9  CL 104  CO2 26  BUN 17  CREATININE 0.87  GLUCOSE 140*  CALCIUM 9.3   Recent Labs  Lab 08/02/24 1345  AST 192*  ALT 154*  ALKPHOS 137*    Recent Labs  Lab 07/31/24 1511 08/02/24 1345  WBC 5.7 7.9  HGB 13.9 13.9  HCT 40.4 42.4  PLT 206 181   No results for input(s): APTT, INR in the last 168 hours.  Imaging Studies  CTAP IMPRESSION: 1. Suspected debris within the downstream common bowel duct, compatible with the patient's known history of choledocholithiasis. Correlation with ERCP may be useful. 2. Progressive biliary dilation, with common bile duct now measuring 16 mm in maximal  diameter. 3. Colonic diverticulosis without diverticulitis. 4. Stable 1.1 cm left renal lesion concerning for renal cell carcinoma based on previous imaging characteristics. 5.  Aortic Atherosclerosis (ICD10-I70.0).  GI Procedures and Studies  November 2025 ERCP - Choledocholithiasis was found. Complete removal was accomplished through a preexisting biliary sphincterotomy.   Assessment  Ms. Mceachron is a 84 y.o. female.  with a pmh significant for hypertension, MDD, anxiety, Crohn's disease (status post ileocecectomy), status post appendectomy, status postcholecystectomy (status post prior ERCPs for choledocholithiasis), GERD, kidney lesion (concerning for possible RCC pending workup still).  The GI service is consulted for evaluation and management of recurrent abdominal pain, abnormal LFTs, concern for recurrent choledocholithiasis on imaging.  The patient is clinically and hemodynamically stable at this moment.  She has evidence on imaging as well as symptomatology concerning for recurrent biliary sludge debris/choledocholithiasis with slight elevation in liver biochemical testing and progressive biliary duct dilation.  She will warrant an ERCP.  The risks of an ERCP were discussed at length, including but not limited to the risk of perforation, bleeding, abdominal pain, post-ERCP pancreatitis (while usually mild can be severe and even life threatening).  Depending on anesthesia availability and my team's availability in the setting of the winter weather, will hopefully be able to perform ERCP on 2/1, if not then will be postponed until Monday unless overt cholangitis develops.  The risks and benefits of endoscopic evaluation/treatment were discussed with the patient and/or family; these include but are not limited to the risk of perforation, infection, bleeding, missed lesions, lack of diagnosis, severe illness requiring hospitalization, as well as anesthesia and sedation related illnesses.  The  patient's history has been reviewed, patient examined, no change in status, and deemed stable for procedure.  The patient and/or family was provided an opportunity to ask questions and all were answered.  The patient and/or family is agreeable to proceed.  All patient questions were answered to the best of my ability, and the patient agrees to the aforementioned plan of action with follow-up as indicated.   Plan/Recommendations  Diet okay for now N.p.o. at midnight INR in a.m. Hopeful for ERCP on 2/1 - If unable to accommodate then will be performed during the week as a result of availability and winter weather May continue ursodiol  for now Strongly recommend medicine service will work on getting this patient a urology  evaluation for the renal lesion noted which has been concerning on multiple imaging studies for possible RCC   Thank you for this consult.  We will continue to follow.  Please page/call with questions or concerns.   Aloha Finner, MD [facility] Office # [phone]     [1]  Allergies Allergen Reactions   Ciprofloxacin  Other (See Comments)    Patient can't remember reaction.   Pneumovax [Pneumococcal Polysaccharide Vaccine] Swelling   Wellbutrin [Bupropion] Anxiety    Anxiety attack  [2]  No current facility-administered medications on file prior to encounter.   Current Outpatient Medications on File Prior to Encounter  Medication Sig Dispense Refill   alum & mag hydroxide-simeth (MAALOX/MYLANTA) 200-200-20 MG/5ML suspension Take 15 mLs by mouth every 6 (six) hours as needed for indigestion, heartburn or flatulence. 355 mL 0   Cholecalciferol (DIALYVITE VITAMIN D  5000) 125 MCG (5000 UT) capsule Take 5,000 Units by mouth daily.     Loperamide HCl (IMODIUM PO) Take 2-4 mg by mouth every 4 (four) hours as needed (Diarrhea).     loratadine  (CLARITIN ) 10 MG tablet Take 10 mg by mouth daily.     metoprolol  succinate (TOPROL -XL) 25  MG 24 hr tablet Take 25 mg by mouth at bedtime.     Multiple Vitamin (MULTIVITAMIN) tablet Take 1 tablet by mouth daily.     ondansetron  (ZOFRAN ) 4 MG tablet Take 1 tablet (4 mg total) by mouth every 6 (six) hours as needed for nausea. 20 tablet 0   pantoprazole  (PROTONIX ) 40 MG tablet Take 1 tablet (40 mg total) by mouth daily. 30 tablet 1   psyllium (METAMUCIL SMOOTH TEXTURE) 58.6 % powder Take 1 packet by mouth at bedtime. (Patient taking differently: Take 1 packet by mouth at bedtime. Patient eats the fiber cookies when she remembers to take the fiber.)     traMADol  (ULTRAM ) 50 MG tablet Take 1 tablet (50 mg total) by mouth every 6 (six) hours as needed for severe pain (pain score 7-10). 14 tablet 0   ursodiol  (ACTIGALL ) 300 MG capsule Take 1 capsule (300 mg total) by mouth 2 (two) times daily. (Patient not taking: Reported on 04/08/2024) 180 capsule 3   Ustekinumab -auub (WEZLANA ) 90 MG/ML SOSY Inject 90 mg into the skin every 6 (six) weeks. 1 mL 9   venlafaxine  XR (EFFEXOR -XR) 37.5 MG 24 hr capsule Take 37.5 mg by mouth daily.     "

## 2024-08-02 NOTE — ED Provider Notes (Signed)
 "  EMERGENCY DEPARTMENT AT [facility] Provider Note   CSN: [id number] Arrival date & time: 08/02/24  1243     Patient presents with: Abdominal Pain   Annette Briggs is a 84 y.o. female.  84 year old female presents emergency department with complaints of left and right upper quadrant abdominal pain since this morning at 1030.  Patient reports he was at the discussion 2 days ago with same complaint and had a ultrasound and CT with negative findings.  Patient reports the pain has increased in severity since then.  She does endorse some radiation up into her chest.  She has associated nausea without vomiting and reports some lightheadedness.  She reports she has had a cholecystectomy and appendectomy.  She did have dilated bile duct secondary to retained stones.  No stones were noted on CT scan 2 days ago.  Patient reports she was supposed to follow-up with GI but the pain was so severe this morning she was concerned.     Prior to Admission medications  Medication Sig Start Date End Date Taking? Authorizing Provider  alum & mag hydroxide-simeth (MAALOX/MYLANTA) 200-200-20 MG/5ML suspension Take 15 mLs by mouth every 6 (six) hours as needed for indigestion, heartburn or flatulence. 02/07/24   Cheryle Page, MD  Cholecalciferol (DIALYVITE VITAMIN D  5000) 125 MCG (5000 UT) capsule Take 5,000 Units by mouth daily.    [provider]  Loperamide HCl (IMODIUM PO) Take 2-4 mg by mouth every 4 (four) hours as needed (Diarrhea).    [provider]  loratadine  (CLARITIN ) 10 MG tablet Take 10 mg by mouth daily.    [provider]  metoprolol  succinate (TOPROL -XL) 25 MG 24 hr tablet Take 25 mg by mouth at bedtime.    [provider]  Multiple Vitamin (MULTIVITAMIN) tablet Take 1 tablet by mouth daily.    [provider]  ondansetron  (ZOFRAN ) 4 MG tablet Take 1 tablet (4 mg total) by mouth every 6 (six) hours as needed for nausea. 02/07/24   Cheryle Page, MD  pantoprazole  (PROTONIX ) 40 MG tablet Take 1 tablet (40 mg total) by mouth daily. 02/03/24 04/08/24  Cleotilde Rogue, MD  psyllium (METAMUCIL SMOOTH TEXTURE) 58.6 % powder Take 1 packet by mouth at bedtime. Patient taking differently: Take 1 packet by mouth at bedtime. Patient eats the fiber cookies when she remembers to take the fiber. 06/28/21   Rehman, Najeeb U, MD  traMADol  (ULTRAM ) 50 MG tablet Take 1 tablet (50 mg total) by mouth every 6 (six) hours as needed for severe pain (pain score 7-10). 02/07/24   Cheryle Page, MD  ursodiol  (ACTIGALL ) 300 MG capsule Take 1 capsule (300 mg total) by mouth 2 (two) times daily. Patient not taking: Reported on 04/08/2024 03/12/24   Carlan, Chelsea L, NP  Ustekinumab -auub (WEZLANA ) 90 MG/ML SOSY Inject 90 mg into the skin every 6 (six) weeks. 12/31/23   Castaneda Mayorga, Daniel, MD  venlafaxine  XR (EFFEXOR -XR) 37.5 MG 24 hr capsule Take 37.5 mg by mouth daily.    [provider]    Allergies: Ciprofloxacin , Pneumovax [pneumococcal polysaccharide vaccine], and Wellbutrin [bupropion]    Review of Systems  Cardiovascular:  Positive for chest pain.  Gastrointestinal:  Positive for abdominal pain and nausea.  Neurological:  Positive for light-headedness.  All other systems reviewed and are negative.   Updated Vital Signs BP 124/68 (BP Location: Left Arm)   Pulse (!) 113   Temp 98.6 F (37 C) (Oral)   Resp 16  Ht 5' 1 (1.549 m)   Wt 58 kg   SpO2 90%   BMI 24.16 kg/m   Physical Exam Vitals and nursing note reviewed.  Constitutional:      General: She is not in acute distress.    Appearance: Normal appearance. She is well-developed. She is not ill-appearing or toxic-appearing.  HENT:     Head: Normocephalic and atraumatic.     Nose: Nose normal.  Eyes:     Extraocular Movements: Extraocular movements intact.     Conjunctiva/sclera: Conjunctivae normal.     Pupils: Pupils are equal, round, and reactive to light.   Cardiovascular:     Rate and Rhythm: Normal rate.  Pulmonary:     Effort: Pulmonary effort is normal. No respiratory distress.     Breath sounds: Normal breath sounds.     Comments: Lungs are clear to auscultation. Chest:     Comments: Patient has chest wall tenderness to palpation Abdominal:     General: Abdomen is flat. Bowel sounds are normal. There is no distension.     Palpations: Abdomen is soft.     Tenderness: There is abdominal tenderness in the right upper quadrant and left upper quadrant. There is no right CVA tenderness or left CVA tenderness.  Musculoskeletal:        General: Normal range of motion.     Cervical back: Normal range of motion.  Skin:    General: Skin is warm.     Capillary Refill: Capillary refill takes less than 2 seconds.  Neurological:     General: No focal deficit present.     Mental Status: She is alert.  Psychiatric:        Mood and Affect: Mood normal.        Behavior: Behavior normal.     (all labs ordered are listed, but only abnormal results are displayed) Labs Reviewed  COMPREHENSIVE METABOLIC PANEL WITH GFR - Abnormal; Notable for the following components:      Result Value   Glucose, Bld 140 (*)    AST 192 (*)    ALT 154 (*)    Alkaline Phosphatase 137 (*)    All other components within normal limits  LIPASE, BLOOD - Abnormal; Notable for the following components:   Lipase 72 (*)    All other components within normal limits  CBC WITH DIFFERENTIAL/PLATELET - Abnormal; Notable for the following components:   Lymphs Abs 0.5 (*)    All other components within normal limits  URINALYSIS, ROUTINE W REFLEX MICROSCOPIC  TROPONIN T, HIGH SENSITIVITY  TROPONIN T, HIGH SENSITIVITY    EKG: None  Radiology: CT ABDOMEN PELVIS W CONTRAST Result Date: 08/02/2024 CLINICAL DATA:  Upper abdominal pain since this morning, nausea without vomiting EXAM: CT ABDOMEN AND PELVIS WITH CONTRAST TECHNIQUE: Multidetector CT imaging of the abdomen and  pelvis was performed using the standard protocol following bolus administration of intravenous contrast. RADIATION DOSE REDUCTION: This exam was performed according to the departmental dose-optimization program which includes automated exposure control, adjustment of the mA and/or kV according to patient size and/or use of iterative reconstruction technique. CONTRAST:  OMNIPAQUE  IOHEXOL  300 MG/ML  SOLN COMPARISON:  07/31/2024 FINDINGS: Lower chest: No acute pleural or parenchymal lung disease. Dependent hypoventilatory changes. Hepatobiliary: Prior cholecystectomy. Slightly progressive biliary dilatation, with common bile duct measuring up to 16 mm. Pneumobilia consistent with prior sphincterotomy. There may be some debris within the downstream common bile duct near the ampulla reference image 35 and 36 of series  4, compatible with choledocholithiasis. Liver is otherwise unremarkable. Pancreas: Unremarkable. No pancreatic ductal dilatation or surrounding inflammatory changes. Spleen: Normal in size without focal abnormality. Adrenals/Urinary Tract: Stable heterogeneous lesion within the superolateral left kidney measuring 1.1 cm reference image 35, previously characterized as concerning for renal cell carcinoma on MRI. Right kidney is unremarkable. The adrenals and bladder are grossly normal. Stomach/Bowel: No bowel obstruction or ileus. Prior appendectomy. Colonic diverticulosis without diverticulitis. No bowel wall thickening or inflammatory change. Vascular/Lymphatic: Aortic atherosclerosis. No enlarged abdominal or pelvic lymph nodes. Reproductive: Status post hysterectomy. No adnexal masses. Other: No free fluid or free intraperitoneal gas. No abdominal wall hernia. Musculoskeletal: No acute or destructive bony abnormalities. Reconstructed images demonstrate no additional findings. IMPRESSION: 1. Suspected debris within the downstream common bowel duct, compatible with the patient's known history of  choledocholithiasis. Correlation with ERCP may be useful. 2. Progressive biliary dilation, with common bile duct now measuring 16 mm in maximal diameter. 3. Colonic diverticulosis without diverticulitis. 4. Stable 1.1 cm left renal lesion concerning for renal cell carcinoma based on previous imaging characteristics. 5.  Aortic Atherosclerosis (ICD10-I70.0). Electronically Signed   By: Ozell Daring M.D.   On: 08/02/2024 15:43     Procedures   Medications Ordered in the ED  morphine  (PF) 2 MG/ML injection 2 mg (has no administration in time range)  ondansetron  (ZOFRAN ) injection 4 mg (4 mg Intravenous Given 08/02/24 1347)  morphine  (PF) 4 MG/ML injection 4 mg (4 mg Intravenous Given 08/02/24 1348)  iohexol  (OMNIPAQUE ) 300 MG/ML solution 100 mL (100 mLs Intravenous Contrast Given 08/02/24 1518)  lactated ringers  bolus 500 mL (500 mLs Intravenous New Bag/Given 08/02/24 1833)    84 y.o. female presents to the ED with complaints of nausea, right left upper quadrant abdominal pain,The differential diagnosis includes choledocholithiasis, cholangitis, acid reflux, gastric ulcer, Crohn's exacerbation, pancreatitis (Ddx)  On arrival pt is nontoxic, vitals unremarkable initial evaluation. Exam significant for significant pain to palpation in upper abdominal quadrants.  I ordered medication morphine  and Zofran  for pain and nausea  Lab Tests:  CMP CBC lipase UA troponin CMP remarkable for elevated AST ALT and alk phos increased from 2 days ago.  Lipase also elevated from 2 days ago. CBC and troponin unremarkable  Imaging Studies ordered:  I ordered imaging studies which included CT abdomen pelvis, concerning for suspected debris downstream from, bile duct.  Progressive biliary dilation measuring 16 mm.  Diverticulosis without diverticulitis and stable renal lesion.  ED Course:    Patient significant history of cholecystectomy with postcholecystectomy and choledocholithiasis.  Will repeat abdominal  labs.  Patient is reporting significant increase in pain from 2 days ago with initial onset this morning at 1030.  AST ALT and alk phos are significant elevated from 2 days ago.  Will proceed with CT for further evaluation.  CT was concerning for 16 mm of common bile duct dilation with possible debris noted.  Spoke with Dr. Shaaron with GI and he reported that she would need an ERCP.  He reports they not do those at [facility] and she would need to be sent to [place].  Dr. Pearlean was consulted with hospitalist service and agreed to admit patient to [facility].  Gastroenterology at [facility] was advised of patient and treatment plan and agreed to consult with her at [facility].   Portions of this note were generated with Scientist, clinical (histocompatibility and immunogenetics). Dictation errors may occur despite best attempts at proofreading.   Final diagnoses:  Right upper quadrant abdominal  pain  Elevated LFTs    ED Discharge Orders     None          Myriam Fonda RAMAN, [place] 08/02/24 2017  "

## 2024-08-03 ENCOUNTER — Inpatient Hospital Stay (HOSPITAL_COMMUNITY): Admitting: Certified Registered Nurse Anesthetist

## 2024-08-03 ENCOUNTER — Encounter: Payer: Self-pay | Attending: Internal Medicine

## 2024-08-03 ENCOUNTER — Inpatient Hospital Stay (HOSPITAL_COMMUNITY)

## 2024-08-03 ENCOUNTER — Encounter (HOSPITAL_COMMUNITY): Payer: Self-pay | Admitting: Internal Medicine

## 2024-08-03 DIAGNOSIS — K838 Other specified diseases of biliary tract: Secondary | ICD-10-CM

## 2024-08-03 DIAGNOSIS — I10 Essential (primary) hypertension: Secondary | ICD-10-CM

## 2024-08-03 DIAGNOSIS — F418 Other specified anxiety disorders: Secondary | ICD-10-CM | POA: Diagnosis not present

## 2024-08-03 LAB — BASIC METABOLIC PANEL WITH GFR
Anion gap: 12 (ref 5–15)
BUN: 18 mg/dL (ref 8–23)
CO2: 28 mmol/L (ref 22–32)
Calcium: 9.1 mg/dL (ref 8.9–10.3)
Chloride: 100 mmol/L (ref 98–111)
Creatinine, Ser: 1.09 mg/dL — ABNORMAL HIGH (ref 0.44–1.00)
GFR, Estimated: 50 mL/min — ABNORMAL LOW
Glucose, Bld: 156 mg/dL — ABNORMAL HIGH (ref 70–99)
Potassium: 3.9 mmol/L (ref 3.5–5.1)
Sodium: 141 mmol/L (ref 135–145)

## 2024-08-03 LAB — CBC WITH DIFFERENTIAL/PLATELET
Abs Immature Granulocytes: 0.06 10*3/uL (ref 0.00–0.07)
Basophils Absolute: 0 10*3/uL (ref 0.0–0.1)
Basophils Relative: 0 %
Eosinophils Absolute: 0 10*3/uL (ref 0.0–0.5)
Eosinophils Relative: 0 %
HCT: 38.9 % (ref 36.0–46.0)
Hemoglobin: 13.3 g/dL (ref 12.0–15.0)
Immature Granulocytes: 1 %
Lymphocytes Relative: 4 %
Lymphs Abs: 0.4 10*3/uL — ABNORMAL LOW (ref 0.7–4.0)
MCH: 30.1 pg (ref 26.0–34.0)
MCHC: 34.2 g/dL (ref 30.0–36.0)
MCV: 88 fL (ref 80.0–100.0)
Monocytes Absolute: 0.2 10*3/uL (ref 0.1–1.0)
Monocytes Relative: 2 %
Neutro Abs: 10.4 10*3/uL — ABNORMAL HIGH (ref 1.7–7.7)
Neutrophils Relative %: 93 %
Platelets: 160 10*3/uL (ref 150–400)
RBC: 4.42 MIL/uL (ref 3.87–5.11)
RDW: 13.1 % (ref 11.5–15.5)
WBC: 11.1 10*3/uL — ABNORMAL HIGH (ref 4.0–10.5)
nRBC: 0 % (ref 0.0–0.2)

## 2024-08-03 LAB — COMPREHENSIVE METABOLIC PANEL WITH GFR
ALT: 345 U/L — ABNORMAL HIGH (ref 0–44)
AST: 226 U/L — ABNORMAL HIGH (ref 15–41)
Albumin: 3.7 g/dL (ref 3.5–5.0)
Alkaline Phosphatase: 124 U/L (ref 38–126)
Anion gap: 10 (ref 5–15)
BUN: 13 mg/dL (ref 8–23)
CO2: 29 mmol/L (ref 22–32)
Calcium: 9.2 mg/dL (ref 8.9–10.3)
Chloride: 100 mmol/L (ref 98–111)
Creatinine, Ser: 0.85 mg/dL (ref 0.44–1.00)
GFR, Estimated: >60 mL/min
Glucose, Bld: 123 mg/dL — ABNORMAL HIGH (ref 70–99)
Potassium: 4.4 mmol/L (ref 3.5–5.1)
Sodium: 139 mmol/L (ref 135–145)
Total Bilirubin: 1.2 mg/dL (ref 0.0–1.2)
Total Protein: 6.2 g/dL — ABNORMAL LOW (ref 6.5–8.1)

## 2024-08-03 LAB — CBC
HCT: 37.2 % (ref 36.0–46.0)
Hemoglobin: 12.7 g/dL (ref 12.0–15.0)
MCH: 29.7 pg (ref 26.0–34.0)
MCHC: 34.1 g/dL (ref 30.0–36.0)
MCV: 87.1 fL (ref 80.0–100.0)
Platelets: 156 10*3/uL (ref 150–400)
RBC: 4.27 MIL/uL (ref 3.87–5.11)
RDW: 13.4 % (ref 11.5–15.5)
WBC: 13.5 10*3/uL — ABNORMAL HIGH (ref 4.0–10.5)
nRBC: 0 % (ref 0.0–0.2)

## 2024-08-03 LAB — HEPATIC FUNCTION PANEL
ALT: 231 U/L — ABNORMAL HIGH (ref 0–44)
AST: 112 U/L — ABNORMAL HIGH (ref 15–41)
Albumin: 3.9 g/dL (ref 3.5–5.0)
Alkaline Phosphatase: 127 U/L — ABNORMAL HIGH (ref 38–126)
Bilirubin, Direct: 0.3 mg/dL — ABNORMAL HIGH (ref 0.0–0.2)
Indirect Bilirubin: 0.3 mg/dL (ref 0.3–0.9)
Total Bilirubin: 0.5 mg/dL (ref 0.0–1.2)
Total Protein: 6.7 g/dL (ref 6.5–8.1)

## 2024-08-03 LAB — PROTIME-INR
INR: 1.1 (ref 0.8–1.2)
Prothrombin Time: 15.3 s — ABNORMAL HIGH (ref 11.4–15.2)

## 2024-08-03 LAB — LIPASE, BLOOD: Lipase: 41 U/L (ref 11–51)

## 2024-08-03 MED ORDER — SUGAMMADEX SODIUM 200 MG/2ML IV SOLN
INTRAVENOUS | Status: DC | PRN
  Administered 2024-08-03: 200 mg via INTRAVENOUS

## 2024-08-03 MED ORDER — SODIUM CHLORIDE 0.9 % IV SOLN: INTRAVENOUS | Status: DC

## 2024-08-03 MED ORDER — PROPOFOL 10 MG/ML IV BOLUS
INTRAVENOUS | Status: DC | PRN
  Administered 2024-08-03: 90 mg via INTRAVENOUS

## 2024-08-03 MED ORDER — SODIUM CHLORIDE 0.9 % IV SOLN
INTRAVENOUS | Status: DC | PRN
  Administered 2024-08-03: 1.5 g via INTRAVENOUS

## 2024-08-03 MED ORDER — PANTOPRAZOLE SODIUM 40 MG PO TBEC
40.0000 mg | DELAYED_RELEASE_TABLET | Freq: Every day | ORAL | Status: DC
  Administered 2024-08-04 (×2): 40 mg via ORAL
  Filled 2024-08-03 (×2): qty 1

## 2024-08-03 MED ORDER — ONDANSETRON HCL 4 MG/2ML IJ SOLN: 4.0000 mg | Freq: Once | INTRAMUSCULAR | Status: DC | PRN

## 2024-08-03 MED ORDER — SODIUM CHLORIDE 0.9 % IV SOLN
INTRAVENOUS | Status: AC
  Filled 2024-08-03: qty 4

## 2024-08-03 MED ORDER — ONDANSETRON HCL 4 MG/2ML IJ SOLN
INTRAMUSCULAR | Status: DC | PRN
  Administered 2024-08-03: 4 mg via INTRAVENOUS

## 2024-08-03 MED ORDER — DICLOFENAC SUPPOSITORY 100 MG
RECTAL | Status: DC | PRN
  Administered 2024-08-03: 100 mg via RECTAL

## 2024-08-03 MED ORDER — DICLOFENAC SUPPOSITORY 100 MG
RECTAL | Status: AC
  Filled 2024-08-03: qty 1

## 2024-08-03 MED ORDER — FENTANYL CITRATE (PF) 100 MCG/2ML IJ SOLN: 25.0000 ug | INTRAMUSCULAR | Status: DC | PRN

## 2024-08-03 MED ORDER — FENTANYL CITRATE (PF) 100 MCG/2ML IJ SOLN
INTRAMUSCULAR | Status: AC
  Filled 2024-08-03: qty 2

## 2024-08-03 MED ORDER — LIDOCAINE 2% (20 MG/ML) 5 ML SYRINGE
INTRAMUSCULAR | Status: DC | PRN
  Administered 2024-08-03: 60 mg via INTRAVENOUS

## 2024-08-03 MED ORDER — LACTATED RINGERS IV SOLN: INTRAVENOUS | Status: DC | PRN

## 2024-08-03 MED ORDER — FENTANYL CITRATE (PF) 100 MCG/2ML IJ SOLN
INTRAMUSCULAR | Status: DC | PRN
  Administered 2024-08-03: 25 ug via INTRAVENOUS
  Administered 2024-08-03: 50 ug via INTRAVENOUS

## 2024-08-03 MED ORDER — SODIUM CHLORIDE 0.9 % IV SOLN
INTRAVENOUS | Status: AC
  Filled 2024-08-03: qty 8

## 2024-08-03 MED ORDER — DEXAMETHASONE SOD PHOSPHATE PF 10 MG/ML IJ SOLN
INTRAMUSCULAR | Status: DC | PRN
  Administered 2024-08-03: 10 mg via INTRAVENOUS

## 2024-08-03 MED ORDER — GLUCAGON HCL RDNA (DIAGNOSTIC) 1 MG IJ SOLR
INTRAMUSCULAR | Status: AC
  Filled 2024-08-03: qty 1

## 2024-08-03 MED ORDER — PHENYLEPHRINE 80 MCG/ML (10ML) SYRINGE FOR IV PUSH (FOR BLOOD PRESSURE SUPPORT)
PREFILLED_SYRINGE | INTRAVENOUS | Status: DC | PRN
  Administered 2024-08-03: 40 ug via INTRAVENOUS
  Administered 2024-08-03: 80 ug via INTRAVENOUS

## 2024-08-03 MED ORDER — ROCURONIUM BROMIDE 10 MG/ML (PF) SYRINGE
PREFILLED_SYRINGE | INTRAVENOUS | Status: DC | PRN
  Administered 2024-08-03: 40 mg via INTRAVENOUS

## 2024-08-03 MED ORDER — SODIUM CHLORIDE 0.9 % IV SOLN
INTRAVENOUS | Status: DC | PRN
  Administered 2024-08-03: 40 mL

## 2024-08-03 NOTE — Anesthesia Postprocedure Evaluation (Signed)
"   Anesthesia Post Note  Patient: JANAIYA BEAUCHESNE  Procedure(s) Performed: ERCP, WITH INTERVENTION IF INDICATED DILATION, STRICTURE, BILE DUCT ERCP, WITH COMMON BILE DUCT CALCULUS LITHROTRIPSY OR REMOVAL USING BASKET     Patient location during evaluation: Endoscopy Anesthesia Type: General Level of consciousness: awake and alert Pain management: pain level controlled Vital Signs Assessment: post-procedure vital signs reviewed and stable Respiratory status: spontaneous breathing, nonlabored ventilation and respiratory function stable Cardiovascular status: blood pressure returned to baseline and stable Postop Assessment: no apparent nausea or vomiting Anesthetic complications: no   No notable events documented.  Last Vitals:  Vitals:   08/03/24 1200 08/03/24 1215  BP: (!) 125/57 124/62  Pulse: 81 83  Resp: 15 20  Temp:    SpO2: 97% 97%    Last Pain:  Vitals:   08/03/24 1215  TempSrc:   PainSc: 0-No pain                 Garnette FORBES Skillern      "

## 2024-08-03 NOTE — Anesthesia Procedure Notes (Signed)
 Procedure Name: Intubation Date/Time: 08/03/2024 10:51 AM  Performed by: Claudene Florina Boga, CRNAPre-anesthesia Checklist: Patient identified, Emergency Drugs available, Suction available and Patient being monitored Patient Re-evaluated:Patient Re-evaluated prior to induction Oxygen Delivery Method: Circle System Utilized Preoxygenation: Pre-oxygenation with 100% oxygen Induction Type: IV induction Ventilation: Mask ventilation without difficulty Laryngoscope Size: Mac and 3 Grade View: Grade I Tube type: Oral Tube size: 7.0 mm Number of attempts: 1 Airway Equipment and Method: Stylet Placement Confirmation: ETT inserted through vocal cords under direct vision, positive ETCO2 and breath sounds checked- equal and bilateral Secured at: 22 cm Tube secured with: Tape Dental Injury: Teeth and Oropharynx as per pre-operative assessment

## 2024-08-03 NOTE — Anesthesia Preprocedure Evaluation (Signed)
"                                    Anesthesia Evaluation  Patient identified by MRN, date of birth, ID band Patient awake    Reviewed: Allergy & Precautions, NPO status , Patient's Chart, lab work & pertinent test results, reviewed documented beta blocker date and time   Airway Mallampati: II  TM Distance: >3 FB Neck ROM: Full    Dental  (+) Teeth Intact, Dental Advisory Given   Pulmonary neg pulmonary ROS   Pulmonary exam normal breath sounds clear to auscultation       Cardiovascular hypertension, Pt. on home beta blockers Normal cardiovascular exam Rhythm:Regular Rate:Normal     Neuro/Psych  PSYCHIATRIC DISORDERS Anxiety Depression    negative neurological ROS     GI/Hepatic ,GERD  ,,Choledocoholithiasis and biliary sludge Crohn's ileocolitis   Endo/Other  negative endocrine ROS    Renal/GU negative Renal ROS     Musculoskeletal negative musculoskeletal ROS (+)    Abdominal   Peds  Hematology negative hematology ROS (+)   Anesthesia Other Findings Day of surgery medications reviewed with the patient.  Reproductive/Obstetrics                              Anesthesia Physical Anesthesia Plan  ASA: 3  Anesthesia Plan: General   Post-op Pain Management: Minimal or no pain anticipated   Induction: Intravenous  PONV Risk Score and Plan: 3 and Dexamethasone  and Ondansetron   Airway Management Planned: Oral ETT  Additional Equipment:   Intra-op Plan:   Post-operative Plan: Extubation in OR  Informed Consent: I have reviewed the patients History and Physical, chart, labs and discussed the procedure including the risks, benefits and alternatives for the proposed anesthesia with the patient or authorized representative who has indicated his/her understanding and acceptance.     Dental advisory given  Plan Discussed with: CRNA  Anesthesia Plan Comments:         Anesthesia Quick Evaluation  "

## 2024-08-03 NOTE — Interval H&P Note (Signed)
 History and Physical Interval Note:  08/03/2024 9:27 AM  Annette Briggs  has presented today for surgery, with the diagnosis of choledocoholithiasis and biliary sludge.  The various methods of treatment have been discussed with the patient and family. After consideration of risks, benefits and other options for treatment, the patient has consented to  Procedures: ERCP, WITH INTERVENTION IF INDICATED (N/A) as a surgical intervention.  The patient's history has been reviewed, patient examined, no change in status, stable for surgery.  I have reviewed the patient's chart and labs.  Questions were answered to the patient's satisfaction.    The risks of an ERCP were discussed at length, including but not limited to the risk of perforation, bleeding, abdominal pain, post-ERCP pancreatitis (while usually mild can be severe and even life threatening).    [name]

## 2024-08-03 NOTE — Op Note (Signed)
 [facility] Patient Name: Annette Briggs Procedure Date : 08/03/2024 MRN: [id number] Attending MD: Aloha Finner , MD, [id number] Date of Birth: 09/23/1940 CSN: [id number] Age: 84 Admit Type: Inpatient Procedure:                ERCP Indications:              Abnormal abdominal CT, Evaluation and possible                            treatment of bile duct stone(s), Elevated liver                            enzymes, Prior Endoscopic Retrograde                            Cholangiopancreatography Providers:                Aloha Finner, MD, Robie Breed, RN,                            [facility] [place], Technician Referring MD:              Medicines:                General Anesthesia, Unasyn  1.5 g IV, Diclofenac  100                            mg rectal Complications:            No immediate complications. Estimated Blood Loss:     Estimated blood loss was minimal. Procedure:                Pre-Anesthesia Assessment:                           - Prior to the procedure, a History and Physical                            was performed, and patient medications and                            allergies were reviewed. The patient's tolerance of                            previous anesthesia was also reviewed. The risks                            and benefits of the procedure and the sedation                            options and risks were discussed with the patient.                            All questions were answered, and informed consent                            was obtained. Prior Anticoagulants: The patient  has                            taken no anticoagulant or antiplatelet agents. ASA                            Grade Assessment: II - A patient with mild systemic                            disease. After reviewing the risks and benefits,                            the patient was deemed in satisfactory condition to                            undergo the  procedure.                           After obtaining informed consent, the scope was                            passed under direct vision. Throughout the                            procedure, the patient's blood pressure, pulse, and                            oxygen saturations were monitored continuously. The                            [employer] single use                            duodenoscope was introduced through the mouth, and                            used to inject contrast into and used to inject                            contrast into the bile duct. The ERCP was                            accomplished without difficulty. The patient                            tolerated the procedure. Scope In: Scope Out: Findings:      The scout film was normal.      The esophagus was successfully intubated under direct vision without       detailed examination of the pharynx, larynx, and associated structures,       and upper GI tract. A biliary sphincterotomy had been performed. The       sphincterotomy appeared open.      A 0.035 inch x 260 cm straight Hydra Jagwire was passed into the biliary  tree. The short-nosed traction sphincterotome was passed over the       guidewire and the bile duct was then deeply cannulated. Contrast was       injected. I personally interpreted the bile duct images. Ductal flow of       contrast was adequate. Image quality was adequate. Contrast extended to       the hepatic ducts. Opacification of the entire biliary tree except for       the cystic duct and gallbladder was successful. The lower third of the       main bile duct contained filling defects thought to be sludge. The main       bile duct was severely dilated. The largest diameter was 16 mm. Dilation       of the common bile duct with an 02-08-09 mm balloon (to a maximum balloon       size of 10 mm) dilator was successful as a sphincteroplasty for 2       minutes. To  discover objects, the biliary tree was swept with a       retrieval balloon. Sludge was swept from the duct. An occlusion       cholangiogram was performed that showed no further significant biliary       pathology.      A pancreatogram was not performed.      The duodenoscope was withdrawn from the patient. Impression:               - Prior biliary sphincterotomy appeared open.                           - The fluoroscopic examination was suspicious for                            sludge.                           - The entire main bile duct was severely dilated.                           - Common bile duct was successfully dilated as                            sphincteroplasty.                           - The biliary tree was swept and sludge was found. Recommendation:           - The patient will be observed post-procedure,                            until all discharge criteria are met.                           - Return patient to hospital ward for ongoing care.                           - Low fat diet.                           -  Observe patient's clinical course.                           - Trend LFT pattern.                           - Watch for pancreatitis, bleeding, perforation,                            and cholangitis.                           - If patient is doing well post procedure and                            tolerating diet, then may be reasonable for                            discharge home today.                           - Can continue Urosodiol, though with this being                            sludge-debris, it is less clear about the overall                            effectiveness of keeping her from having recurrent                            biliary disease. Suspect that her biliary dilation                            is leading to increased viscus sludge ball                            development. Hopefully this will not happen again.                             Time will tell.                           - The findings and recommendations were discussed                            with the patient.                           - The findings and recommendations were discussed                            with the referring physician. Procedure Code(s):        --- Professional ---                           [id number], 59, Endoscopic retrograde  cholangiopancreatography (ERCP); with                            trans-endoscopic balloon dilation of                            biliary/pancreatic duct(s) or of ampulla                            (sphincteroplasty), including sphincterotomy, when                            performed, each duct                           43264, 59, Endoscopic retrograde                            cholangiopancreatography (ERCP); with removal of                            calculi/debris from biliary/pancreatic duct(s)                           25671, Endoscopic catheterization of the biliary                            ductal system, radiological supervision and                            interpretation Diagnosis Code(s):        --- Professional ---                           K80.50, Calculus of bile duct without cholangitis                            or cholecystitis without obstruction                           R74.8, Abnormal levels of other serum enzymes                           Z98.890, Other specified postprocedural states                           K83.8, Other specified diseases of biliary tract                           R93.5, Abnormal findings on diagnostic imaging of                            other abdominal regions, including retroperitoneum CPT copyright 2022 [employer] Association. All rights reserved. The codes documented in this report are preliminary and upon coder review may  be revised to meet current compliance requirements. Aloha Finner, MD 08/03/2024 11:48:16 AM Number of  Addenda: 0

## 2024-08-03 NOTE — Progress Notes (Signed)
 " PROGRESS NOTE    Annette Briggs  FMW:[id number] DOB: 28-Jun-1941 DOA: 08/02/2024 PCP: Shona Norleen PEDLAR, MD  Outpatient Specialists:     Brief Narrative:  As per H&P done on presentation: Annette Briggs is a 84 y.o. female with medical history significant for Crohn's disease, hypertension, depression, left renal mass. Patient presented to the ED with complaints of Crohn's disease, hypertension, depression, left renal mass. Patient presented to the ED with complaints of upper abdominal pain that started about 10:30 AM today after she ate breakfast, she reports nausea but no vomiting.  Last bowel movement was yesterday.  No urinary symptoms. She was in the ED 1/29 with similar abdominal pain, CT abdomen and pelvis with contrast was negative.  Her abdominal pain improved so she was discharged from the ED.   Recent hospitalization at [facility] 11/7 to 11/8 for choledocholithiasis, underwent ERCP 11/8 with balloon sphincteroplasty , 12 mm stone seen and removed, debris/sludge also noted.   ED Course: Temperature is 98.4.  Heart rate 76-119.  Respirate rate 16-23.  Blood pressure systolic 124-177.  O2 sats 90 to 97% on room air. Elevated liver enzymes, AST 192, ALT 154, ALP 137, T. bili 1.2.  Lipase 72. CT abdomen pelvis with contrast-shows suspected debris in the downstream common bile duct, progressive biliary dilatation now measuring 16 mm in diameter. EDP talked to GI Dr. Shaaron, recommended admission to [facility] for ERCP. [facility] eval on-call-Dr. Wilhelmenia, agreed with [name] admission, will see in consult.  08/03/2024: Patient seen.  Patient has undergone ERCP.  ERCP revealed biliary sludge.  Abdominal pain has resolved.  Diet is being advanced.  Likely discharge back home tomorrow.  GI input is highly appreciated.   Assessment & Plan:   Principal Problem:   Common bile duct (CBD) obstruction (HCC) Active Problems:   Crohn disease (HCC)   Essential hypertension   Depression    Anxiety and depression   Left renal mass   Elevated LFTs   Common bile duct dilation   History of ERCP   Right upper quadrant abdominal pain   Biliary sludge   CBD obstruction/biliary sludge: -CT abdomen and pelvis with contrast revealed suspected debris within the downstream common bowel duct, Progressive biliary dilation, with common bile duct now measuring 16 mm in maximal diameter. - History of cholelithiasis and cholecystectomy. - ERCP revealed biliary sludge, there was swept. --Abdominal pain has resolved. - Advance diet. - Likely discharge back home tomorrow. - GI input is appreciated.    Hypertension: - Seems controlled now. -Initially elevated blood pressure may have been related to pain. - Last documented blood pressure 124/62 mmHg.   Depression:  Left renal mass: Follow-up with urology team on discharge.    DVT prophylaxis: SCD. Code Status: Full code. Family Communication:  Disposition Plan:    Consultants:  GI  Procedures:  ERCP revealed: - Prior biliary sphincterotomy appeared open.                           - The fluoroscopic examination was suspicious for                            sludge.                           - The entire main bile duct was severely dilated.                           -  Common bile duct was successfully dilated as                            sphincteroplasty.                           - The biliary tree was swept and sludge was found.  Antimicrobials:  None.   Subjective: Abdominal pain has resolved.  Objective: Vitals:   08/02/24 2029 08/03/24 0031 08/03/24 0441 08/03/24 1145  BP: (!) 120/59 (!) 102/59 101/61 (!) 158/63  Pulse: (!) 102 80 73 88  Resp: 17  14 18   Temp: 99.2 F (37.3 C) 98.6 F (37 C) 98 F (36.7 C) 98.8 F (37.1 C)  TempSrc:  Oral    SpO2: 92% 96% 100% 98%  Weight:      Height:       No intake or output data in the 24 hours ending 08/03/24 1213 Filed Weights   08/02/24 1258  Weight: 58 kg     Examination:  General exam: Appears calm and comfortable  Respiratory system: Clear to auscultation. Respiratory effort normal. Cardiovascular system: S1 & S2 heard, RRR. No JVD, murmurs, rubs, gallops or clicks. No pedal edema. Gastrointestinal system: Abdomen is nondistended, soft and nontender. No organomegaly or masses felt. Normal bowel sounds heard. Central nervous system: Alert and oriented. No focal neurological deficits. Extremities: Symmetric 5 x 5 power. Skin: No rashes, lesions or ulcers Psychiatry: Judgement and insight appear normal. Mood & affect appropriate.     Data Reviewed: I have personally reviewed following labs and imaging studies  CBC: Recent Labs  Lab 07/31/24 1511 08/02/24 1345 08/03/24 0327  WBC 5.7 7.9 13.5*  NEUTROABS 3.8 7.2  --   HGB 13.9 13.9 12.7  HCT 40.4 42.4 37.2  MCV 87.4 88.9 87.1  PLT 206 181 156   Basic Metabolic Panel: Recent Labs  Lab 07/31/24 1511 08/02/24 1345 08/03/24 0327  NA 140 144 139  K 4.2 3.9 4.4  CL 102 104 100  CO2 27 26 29   GLUCOSE 90 140* 123*  BUN 18 17 13   CREATININE 0.93 0.87 0.85  CALCIUM 9.6 9.3 9.2   GFR: Estimated Creatinine Clearance: 39.6 mL/min (by C-G formula based on SCr of 0.85 mg/dL). Liver Function Tests: Recent Labs  Lab 07/31/24 1511 08/02/24 1345 08/03/24 0327  AST 31 192* 226*  ALT 22 154* 345*  ALKPHOS 90 137* 124  BILITOT 0.4 1.2 1.2  PROT 7.2 6.9 6.2*  ALBUMIN 4.3 4.2 3.7   Recent Labs  Lab 07/31/24 1511 08/02/24 1345  LIPASE 46 72*   No results for input(s): AMMONIA in the last 168 hours. Coagulation Profile: Recent Labs  Lab 08/03/24 0327  INR 1.1   Cardiac Enzymes: No results for input(s): CKTOTAL, CKMB, CKMBINDEX, TROPONINI in the last 168 hours. BNP (last 3 results) No results for input(s): PROBNP in the last 8760 hours. HbA1C: No results for input(s): HGBA1C in the last 72 hours. CBG: No results for input(s): GLUCAP in the last 168  hours. Lipid Profile: No results for input(s): CHOL, HDL, LDLCALC, TRIG, CHOLHDL, LDLDIRECT in the last 72 hours. Thyroid Function Tests: No results for input(s): TSH, T4TOTAL, FREET4, T3FREE, THYROIDAB in the last 72 hours. Anemia Panel: No results for input(s): VITAMINB12, FOLATE, FERRITIN, TIBC, IRON, RETICCTPCT in the last 72 hours. Urine analysis:    Component Value Date/Time   COLORURINE YELLOW 08/02/2024 1337  APPEARANCEUR CLEAR 08/02/2024 1337   LABSPEC 1.023 08/02/2024 1337   PHURINE 7.0 08/02/2024 1337   GLUCOSEU NEGATIVE 08/02/2024 1337   HGBUR NEGATIVE 08/02/2024 1337   BILIRUBINUR NEGATIVE 08/02/2024 1337   KETONESUR NEGATIVE 08/02/2024 1337   PROTEINUR NEGATIVE 08/02/2024 1337   NITRITE NEGATIVE 08/02/2024 1337   LEUKOCYTESUR NEGATIVE 08/02/2024 1337   Sepsis Labs: @LABRCNTIP (procalcitonin:4,lacticidven:4)  )No results found for this or any previous visit (from the past 240 hours).       Radiology Studies: CT ABDOMEN PELVIS W CONTRAST Result Date: 08/02/2024 CLINICAL DATA:  Upper abdominal pain since this morning, nausea without vomiting EXAM: CT ABDOMEN AND PELVIS WITH CONTRAST TECHNIQUE: Multidetector CT imaging of the abdomen and pelvis was performed using the standard protocol following bolus administration of intravenous contrast. RADIATION DOSE REDUCTION: This exam was performed according to the departmental dose-optimization program which includes automated exposure control, adjustment of the mA and/or kV according to patient size and/or use of iterative reconstruction technique. CONTRAST:  OMNIPAQUE  IOHEXOL  300 MG/ML  SOLN COMPARISON:  07/31/2024 FINDINGS: Lower chest: No acute pleural or parenchymal lung disease. Dependent hypoventilatory changes. Hepatobiliary: Prior cholecystectomy. Slightly progressive biliary dilatation, with common bile duct measuring up to 16 mm. Pneumobilia consistent with prior sphincterotomy.  There may be some debris within the downstream common bile duct near the ampulla reference image 35 and 36 of series 4, compatible with choledocholithiasis. Liver is otherwise unremarkable. Pancreas: Unremarkable. No pancreatic ductal dilatation or surrounding inflammatory changes. Spleen: Normal in size without focal abnormality. Adrenals/Urinary Tract: Stable heterogeneous lesion within the superolateral left kidney measuring 1.1 cm reference image 35, previously characterized as concerning for renal cell carcinoma on MRI. Right kidney is unremarkable. The adrenals and bladder are grossly normal. Stomach/Bowel: No bowel obstruction or ileus. Prior appendectomy. Colonic diverticulosis without diverticulitis. No bowel wall thickening or inflammatory change. Vascular/Lymphatic: Aortic atherosclerosis. No enlarged abdominal or pelvic lymph nodes. Reproductive: Status post hysterectomy. No adnexal masses. Other: No free fluid or free intraperitoneal gas. No abdominal wall hernia. Musculoskeletal: No acute or destructive bony abnormalities. Reconstructed images demonstrate no additional findings. IMPRESSION: 1. Suspected debris within the downstream common bowel duct, compatible with the patient's known history of choledocholithiasis. Correlation with ERCP may be useful. 2. Progressive biliary dilation, with common bile duct now measuring 16 mm in maximal diameter. 3. Colonic diverticulosis without diverticulitis. 4. Stable 1.1 cm left renal lesion concerning for renal cell carcinoma based on previous imaging characteristics. 5.  Aortic Atherosclerosis (ICD10-I70.0). Electronically Signed   By: Ozell Daring M.D.   On: 08/02/2024 15:43        Scheduled Meds: Continuous Infusions:  sodium chloride      lactated ringers  75 mL/hr at 08/02/24 2208     LOS: 1 day    Time spent: 35 minutes.    Leatrice Chapel, MD  [facility] 7PM-7AM contact night coverage as above    "

## 2024-08-03 NOTE — Plan of Care (Signed)
   Problem: Education: Goal: Knowledge of General Education information will improve Description Including pain rating scale, medication(s)/side effects and non-pharmacologic comfort measures Outcome: Progressing

## 2024-08-03 NOTE — Transfer of Care (Signed)
 Immediate Anesthesia Transfer of Care Note  Patient: Annette Briggs  Procedure(s) Performed: ERCP, WITH INTERVENTION IF INDICATED DILATION, STRICTURE, BILE DUCT ERCP, WITH COMMON BILE DUCT CALCULUS LITHROTRIPSY OR REMOVAL USING BASKET  Patient Location: PACU  Anesthesia Type:General  Level of Consciousness: drowsy  Airway & Oxygen Therapy: Patient Spontanous Breathing and Patient connected to face mask oxygen  Post-op Assessment: Report given to RN and Post -op Vital signs reviewed and stable  Post vital signs: Reviewed and stable  Last Vitals:  Vitals Value Taken Time  BP 158/63 08/03/24 11:46  Temp    Pulse 86 08/03/24 11:48  Resp 18 08/03/24 11:48  SpO2 99 % 08/03/24 11:48  Vitals shown include unfiled device data.  Last Pain:  Vitals:   08/03/24 0031  TempSrc: Oral  PainSc:          Complications: No notable events documented.

## 2024-08-03 NOTE — Progress Notes (Signed)
" °   08/03/24 1849  TOC Brief Assessment  Insurance and Status Reviewed  Patient has primary care physician Yes (HALL, JOHN Z)  Home environment has been reviewed From home with spouse  Prior level of function: Independent  Prior/Current Home Services No current home services  Social Drivers of Health Review SDOH reviewed no interventions necessary  Readmission risk has been reviewed Yes  Transition of care needs transition of care needs identified, TOC will continue to follow (Watch for DC needs post OP)    "

## 2024-08-04 DIAGNOSIS — K8050 Calculus of bile duct without cholangitis or cholecystitis without obstruction: Secondary | ICD-10-CM

## 2024-08-04 DIAGNOSIS — K219 Gastro-esophageal reflux disease without esophagitis: Secondary | ICD-10-CM

## 2024-08-04 DIAGNOSIS — K5090 Crohn's disease, unspecified, without complications: Secondary | ICD-10-CM

## 2024-08-04 MED ORDER — ACETAMINOPHEN 325 MG PO TABS: 650.0000 mg | ORAL_TABLET | Freq: Four times a day (QID) | ORAL | Status: AC | PRN

## 2024-08-04 MED ORDER — POLYETHYLENE GLYCOL 3350 17 G PO PACK: 17.0000 g | PACK | Freq: Every day | ORAL | 0 refills | Status: AC | PRN

## 2024-08-04 MED ORDER — PANTOPRAZOLE SODIUM 40 MG PO TBEC: 40.0000 mg | DELAYED_RELEASE_TABLET | Freq: Every day | ORAL | Status: DC

## 2024-08-04 NOTE — Progress Notes (Signed)
 Discharge Nurse Summary: DC order noted per MD. DC RN at bedside with patient. Patient agreeable with discharge plan, states family will arrive soon for pickup. AVS printed/reviewed. PIV not present on assessment, skin intact. No DME needs. No home/TOC meds. CP/Edu resolved. Telemonitor returned to charging station. All belongings accounted for. Patient wheeled downstairs for discharge by private auto.   Rosario EMERSON Lund, RN

## 2024-08-04 NOTE — Plan of Care (Signed)
" °  Problem: Education: Goal: Knowledge of General Education information will improve Description: Including pain rating scale, medication(s)/side effects and non-pharmacologic comfort measures Outcome: Progressing   Problem: Health Behavior/Discharge Planning: Goal: Ability to manage health-related needs will improve Outcome: Progressing   Problem: Clinical Measurements: Goal: Ability to maintain clinical measurements within normal limits will improve Outcome: Progressing Goal: Diagnostic test results will improve Outcome: Progressing   Problem: Nutrition: Goal: Adequate nutrition will be maintained Outcome: Progressing   Problem: Coping: Goal: Level of anxiety will decrease Outcome: Progressing   Problem: Bowel/Gastric: Goal: Gastrointestinal status for postoperative course will improve Outcome: Progressing   "

## 2024-08-06 ENCOUNTER — Telehealth (INDEPENDENT_AMBULATORY_CARE_PROVIDER_SITE_OTHER): Payer: Self-pay

## 2024-08-06 NOTE — Telephone Encounter (Signed)
 I have called the patient and got her vm, I left a detailed message that the next available appointment here would be with Mitzie Boettcher Np, on 09/23/2024 at 10:45. I asked that she please return call to the office.    Patient called today to let us  know she was admitted to the hospital and had a ERCP done on 08/03/2024, she states they asked that she follow up with us  in clinic.

## 2024-08-06 NOTE — Telephone Encounter (Signed)
 I spoke with the patient and made her aware the next available appointment with any provider is With Gastroenterology Velvet LITTIE Boettcher, NP) 09/23/2024 at 10:45 AM   She was made aware of the date and asked to come into the office at 10:30 Am. Patient states understanding.

## 2024-09-23 ENCOUNTER — Ambulatory Visit (INDEPENDENT_AMBULATORY_CARE_PROVIDER_SITE_OTHER): Admitting: Gastroenterology
